# Patient Record
Sex: Female | Born: 1940 | Race: Black or African American | Hispanic: No | Smoking: Never smoker
Health system: Southern US, Community
[De-identification: ages and names within clinical notes are randomized; demographics above are authoritative.]

## PROBLEM LIST (undated history)

## (undated) DIAGNOSIS — R002 Palpitations: Secondary | ICD-10-CM

## (undated) DIAGNOSIS — I4891 Unspecified atrial fibrillation: Secondary | ICD-10-CM

## (undated) DIAGNOSIS — E119 Type 2 diabetes mellitus without complications: Secondary | ICD-10-CM

## (undated) DIAGNOSIS — R600 Localized edema: Secondary | ICD-10-CM

## (undated) DIAGNOSIS — K219 Gastro-esophageal reflux disease without esophagitis: Secondary | ICD-10-CM

## (undated) DIAGNOSIS — L710 Perioral dermatitis: Secondary | ICD-10-CM

## (undated) DIAGNOSIS — I10 Essential (primary) hypertension: Secondary | ICD-10-CM

## (undated) DIAGNOSIS — N390 Urinary tract infection, site not specified: Secondary | ICD-10-CM

## (undated) DIAGNOSIS — I499 Cardiac arrhythmia, unspecified: Secondary | ICD-10-CM

## (undated) DIAGNOSIS — M1990 Unspecified osteoarthritis, unspecified site: Secondary | ICD-10-CM

## (undated) DIAGNOSIS — M5416 Radiculopathy, lumbar region: Secondary | ICD-10-CM

## (undated) DIAGNOSIS — M549 Dorsalgia, unspecified: Secondary | ICD-10-CM

## (undated) DIAGNOSIS — J45909 Unspecified asthma, uncomplicated: Secondary | ICD-10-CM

## (undated) DIAGNOSIS — E7800 Pure hypercholesterolemia, unspecified: Secondary | ICD-10-CM

## (undated) DIAGNOSIS — M67479 Ganglion, unspecified ankle and foot: Secondary | ICD-10-CM

## (undated) HISTORY — DX: Perioral dermatitis: L71.0

## (undated) HISTORY — DX: Radiculopathy, lumbar region: M54.16

## (undated) HISTORY — DX: Urinary tract infection, site not specified: N39.0

## (undated) HISTORY — PX: WISDOM TOOTH EXTRACTION: SHX21

## (undated) HISTORY — PX: COLONOSCOPY: SHX174

## (undated) HISTORY — DX: Dorsalgia, unspecified: M54.9

## (undated) HISTORY — DX: Type 2 diabetes mellitus without complications: E11.9

## (undated) HISTORY — DX: Pure hypercholesterolemia, unspecified: E78.00

## (undated) HISTORY — DX: Unspecified osteoarthritis, unspecified site: M19.90

## (undated) HISTORY — DX: Palpitations: R00.2

## (undated) HISTORY — DX: Unspecified atrial fibrillation: I48.91

## (undated) HISTORY — PX: CATARACT EXTRACTION, BILATERAL: SHX1313

## (undated) HISTORY — DX: Ganglion, unspecified ankle and foot: M67.479

---

## 2005-07-13 ENCOUNTER — Other Ambulatory Visit: Payer: Self-pay

## 2005-07-13 ENCOUNTER — Emergency Department: Payer: Self-pay | Admitting: Emergency Medicine

## 2009-01-19 ENCOUNTER — Emergency Department (HOSPITAL_COMMUNITY): Admission: EM | Admit: 2009-01-19 | Discharge: 2009-01-19 | Payer: Self-pay | Admitting: Emergency Medicine

## 2009-02-14 ENCOUNTER — Encounter: Payer: Self-pay | Admitting: Family Medicine

## 2009-02-22 ENCOUNTER — Encounter: Payer: Self-pay | Admitting: Family Medicine

## 2009-03-25 ENCOUNTER — Encounter: Payer: Self-pay | Admitting: Family Medicine

## 2009-04-23 ENCOUNTER — Ambulatory Visit: Payer: Self-pay | Admitting: Family Medicine

## 2010-08-13 ENCOUNTER — Emergency Department: Payer: Self-pay | Admitting: Emergency Medicine

## 2012-10-26 DIAGNOSIS — E1165 Type 2 diabetes mellitus with hyperglycemia: Secondary | ICD-10-CM | POA: Diagnosis present

## 2013-05-08 ENCOUNTER — Emergency Department: Payer: Self-pay | Admitting: Emergency Medicine

## 2013-10-08 DIAGNOSIS — M6740 Ganglion, unspecified site: Secondary | ICD-10-CM | POA: Insufficient documentation

## 2013-10-30 DIAGNOSIS — M549 Dorsalgia, unspecified: Secondary | ICD-10-CM | POA: Insufficient documentation

## 2014-01-02 ENCOUNTER — Ambulatory Visit: Payer: Self-pay | Admitting: Internal Medicine

## 2014-01-05 ENCOUNTER — Ambulatory Visit: Payer: Self-pay | Admitting: Internal Medicine

## 2014-09-07 DIAGNOSIS — M179 Osteoarthritis of knee, unspecified: Secondary | ICD-10-CM | POA: Insufficient documentation

## 2014-09-07 DIAGNOSIS — M1710 Unilateral primary osteoarthritis, unspecified knee: Secondary | ICD-10-CM | POA: Insufficient documentation

## 2014-10-16 ENCOUNTER — Ambulatory Visit (INDEPENDENT_AMBULATORY_CARE_PROVIDER_SITE_OTHER): Payer: Medicare Other | Admitting: Family Medicine

## 2014-10-16 ENCOUNTER — Encounter: Payer: Self-pay | Admitting: Family Medicine

## 2014-10-16 VITALS — BP 142/71 | HR 86 | Ht 66.0 in | Wt 196.0 lb

## 2014-10-16 DIAGNOSIS — J4530 Mild persistent asthma, uncomplicated: Secondary | ICD-10-CM

## 2014-10-16 DIAGNOSIS — E538 Deficiency of other specified B group vitamins: Secondary | ICD-10-CM

## 2014-10-16 DIAGNOSIS — K219 Gastro-esophageal reflux disease without esophagitis: Secondary | ICD-10-CM | POA: Insufficient documentation

## 2014-10-16 DIAGNOSIS — E785 Hyperlipidemia, unspecified: Secondary | ICD-10-CM | POA: Insufficient documentation

## 2014-10-16 DIAGNOSIS — J3089 Other allergic rhinitis: Secondary | ICD-10-CM

## 2014-10-16 DIAGNOSIS — E782 Mixed hyperlipidemia: Secondary | ICD-10-CM | POA: Insufficient documentation

## 2014-10-16 DIAGNOSIS — I10 Essential (primary) hypertension: Secondary | ICD-10-CM | POA: Diagnosis not present

## 2014-10-16 DIAGNOSIS — E119 Type 2 diabetes mellitus without complications: Secondary | ICD-10-CM | POA: Insufficient documentation

## 2014-10-16 DIAGNOSIS — Z1239 Encounter for other screening for malignant neoplasm of breast: Secondary | ICD-10-CM

## 2014-10-16 DIAGNOSIS — R002 Palpitations: Secondary | ICD-10-CM | POA: Insufficient documentation

## 2014-10-16 DIAGNOSIS — Z01419 Encounter for gynecological examination (general) (routine) without abnormal findings: Secondary | ICD-10-CM

## 2014-10-16 DIAGNOSIS — J309 Allergic rhinitis, unspecified: Secondary | ICD-10-CM | POA: Insufficient documentation

## 2014-10-16 NOTE — Progress Notes (Signed)
  Subjective:     Mallory Arellano is a 74 y.o. female and is here for a GYN exam. The patient reports problems - poorly controlled DM. She is taking care of a sick husband and is not taking care of self.  Has primary MD. Had mammogram in 1/16--due for 6 month f/u. Does not want to see the same provider. No h/o abnormal pap smear. Reports vaginal itching on occasion.  History   Social History  . Marital Status: Married    Spouse Name: N/A  . Number of Children: N/A  . Years of Education: N/A   Occupational History  . Not on file.   Social History Main Topics  . Smoking status: Never Smoker   . Smokeless tobacco: Never Used  . Alcohol Use: No  . Drug Use: No  . Sexual Activity:    Partners: Male    Birth Control/ Protection: Post-menopausal   Other Topics Concern  . Not on file   Social History Narrative  . No narrative on file   There are no preventive care reminders to display for this patient.  The following portions of the patient's history were reviewed and updated as appropriate: allergies, current medications, past family history, past medical history, past social history, past surgical history and problem list.  Review of Systems A comprehensive review of systems was negative.   Objective:    BP 142/71 mmHg  Pulse 86  Ht 5\' 6"  (1.676 m)  Wt 196 lb (88.905 kg)  BMI 31.65 kg/m2 General appearance: alert, cooperative and appears stated age Head: Normocephalic, without obvious abnormality, atraumatic Neck: no adenopathy, supple, symmetrical, trachea midline and thyroid not enlarged, symmetric, no tenderness/mass/nodules Lungs: clear to auscultation bilaterally Breasts: normal appearance, no masses or tenderness Heart: regular rate and rhythm, S1, S2 normal, no murmur, click, rub or gallop Abdomen: soft, non-tender; bowel sounds normal; no masses,  no organomegaly Pelvic: cervix normal in appearance, external genitalia normal, no adnexal masses or tenderness, no  cervical motion tenderness, uterus normal size, shape, and consistency and atrophic vagina Extremities: extremities normal, atraumatic, no cyanosis or edema Pulses: 2+ and symmetric Skin: Skin color, texture, turgor normal. No rashes or lesions Lymph nodes: Cervical, supraclavicular, and axillary nodes normal. Neurologic: Grossly normal    Assessment:   GYN exam  Plan:  No longer requires pap smears. F/u mammogram as instructed-will schedule Per PCP--other f/u including needs colonoscopy   See After Visit Summary for Counseling Recommendations

## 2014-10-16 NOTE — Patient Instructions (Signed)
Preventive Care for Adults A healthy lifestyle and preventive care can promote health and wellness. Preventive health guidelines for women include the following key practices.  A routine yearly physical is a good way to check with your health care provider about your health and preventive screening. It is a chance to share any concerns and updates on your health and to receive a thorough exam.  Visit your dentist for a routine exam and preventive care every 6 months. Brush your teeth twice a day and floss once a day. Good oral hygiene prevents tooth decay and gum disease.  The frequency of eye exams is based on your age, health, family medical history, use of contact lenses, and other factors. Follow your health care provider's recommendations for frequency of eye exams.  Eat a healthy diet. Foods like vegetables, fruits, whole grains, low-fat dairy products, and lean protein foods contain the nutrients you need without too many calories. Decrease your intake of foods high in solid fats, added sugars, and salt. Eat the right amount of calories for you.Get information about a proper diet from your health care provider, if necessary.  Regular physical exercise is one of the most important things you can do for your health. Most adults should get at least 150 minutes of moderate-intensity exercise (any activity that increases your heart rate and causes you to sweat) each week. In addition, most adults need muscle-strengthening exercises on 2 or more days a week.  Maintain a healthy weight. The body mass index (BMI) is a screening tool to identify possible weight problems. It provides an estimate of body fat based on height and weight. Your health care provider can find your BMI and can help you achieve or maintain a healthy weight.For adults 20 years and older:  A BMI below 18.5 is considered underweight.  A BMI of 18.5 to 24.9 is normal.  A BMI of 25 to 29.9 is considered overweight.  A BMI of  30 and above is considered obese.  Maintain normal blood lipids and cholesterol levels by exercising and minimizing your intake of saturated fat. Eat a balanced diet with plenty of fruit and vegetables. Blood tests for lipids and cholesterol should begin at age 76 and be repeated every 5 years. If your lipid or cholesterol levels are high, you are over 50, or you are at high risk for heart disease, you may need your cholesterol levels checked more frequently.Ongoing high lipid and cholesterol levels should be treated with medicines if diet and exercise are not working.  If you smoke, find out from your health care provider how to quit. If you do not use tobacco, do not start.  Lung cancer screening is recommended for adults aged 22-80 years who are at high risk for developing lung cancer because of a history of smoking. A yearly low-dose CT scan of the lungs is recommended for people who have at least a 30-pack-year history of smoking and are a current smoker or have quit within the past 15 years. A pack year of smoking is smoking an average of 1 pack of cigarettes a day for 1 year (for example: 1 pack a day for 30 years or 2 packs a day for 15 years). Yearly screening should continue until the smoker has stopped smoking for at least 15 years. Yearly screening should be stopped for people who develop a health problem that would prevent them from having lung cancer treatment.  If you are pregnant, do not drink alcohol. If you are breastfeeding,  be very cautious about drinking alcohol. If you are not pregnant and choose to drink alcohol, do not have more than 1 drink per day. One drink is considered to be 12 ounces (355 mL) of beer, 5 ounces (148 mL) of wine, or 1.5 ounces (44 mL) of liquor.  Avoid use of street drugs. Do not share needles with anyone. Ask for help if you need support or instructions about stopping the use of drugs.  High blood pressure causes heart disease and increases the risk of  stroke. Your blood pressure should be checked at least every 1 to 2 years. Ongoing high blood pressure should be treated with medicines if weight loss and exercise do not work.  If you are 75-52 years old, ask your health care provider if you should take aspirin to prevent strokes.  Diabetes screening involves taking a blood sample to check your fasting blood sugar level. This should be done once every 3 years, after age 15, if you are within normal weight and without risk factors for diabetes. Testing should be considered at a younger age or be carried out more frequently if you are overweight and have at least 1 risk factor for diabetes.  Breast cancer screening is essential preventive care for women. You should practice "breast self-awareness." This means understanding the normal appearance and feel of your breasts and may include breast self-examination. Any changes detected, no matter how small, should be reported to a health care provider. Women in their 58s and 30s should have a clinical breast exam (CBE) by a health care provider as part of a regular health exam every 1 to 3 years. After age 16, women should have a CBE every year. Starting at age 53, women should consider having a mammogram (breast X-ray test) every year. Women who have a family history of breast cancer should talk to their health care provider about genetic screening. Women at a high risk of breast cancer should talk to their health care providers about having an MRI and a mammogram every year.  Breast cancer gene (BRCA)-related cancer risk assessment is recommended for women who have family members with BRCA-related cancers. BRCA-related cancers include breast, ovarian, tubal, and peritoneal cancers. Having family members with these cancers may be associated with an increased risk for harmful changes (mutations) in the breast cancer genes BRCA1 and BRCA2. Results of the assessment will determine the need for genetic counseling and  BRCA1 and BRCA2 testing.  Routine pelvic exams to screen for cancer are no longer recommended for nonpregnant women who are considered low risk for cancer of the pelvic organs (ovaries, uterus, and vagina) and who do not have symptoms. Ask your health care provider if a screening pelvic exam is right for you.  If you have had past treatment for cervical cancer or a condition that could lead to cancer, you need Pap tests and screening for cancer for at least 20 years after your treatment. If Pap tests have been discontinued, your risk factors (such as having a new sexual partner) need to be reassessed to determine if screening should be resumed. Some women have medical problems that increase the chance of getting cervical cancer. In these cases, your health care provider may recommend more frequent screening and Pap tests.  The HPV test is an additional test that may be used for cervical cancer screening. The HPV test looks for the virus that can cause the cell changes on the cervix. The cells collected during the Pap test can be  tested for HPV. The HPV test could be used to screen women aged 30 years and older, and should be used in women of any age who have unclear Pap test results. After the age of 30, women should have HPV testing at the same frequency as a Pap test.  Colorectal cancer can be detected and often prevented. Most routine colorectal cancer screening begins at the age of 50 years and continues through age 75 years. However, your health care provider may recommend screening at an earlier age if you have risk factors for colon cancer. On a yearly basis, your health care provider may provide home test kits to check for hidden blood in the stool. Use of a small camera at the end of a tube, to directly examine the colon (sigmoidoscopy or colonoscopy), can detect the earliest forms of colorectal cancer. Talk to your health care provider about this at age 50, when routine screening begins. Direct  exam of the colon should be repeated every 5-10 years through age 75 years, unless early forms of pre-cancerous polyps or small growths are found.  People who are at an increased risk for hepatitis B should be screened for this virus. You are considered at high risk for hepatitis B if:  You were born in a country where hepatitis B occurs often. Talk with your health care provider about which countries are considered high risk.  Your parents were born in a high-risk country and you have not received a shot to protect against hepatitis B (hepatitis B vaccine).  You have HIV or AIDS.  You use needles to inject street drugs.  You live with, or have sex with, someone who has hepatitis B.  You get hemodialysis treatment.  You take certain medicines for conditions like cancer, organ transplantation, and autoimmune conditions.  Hepatitis C blood testing is recommended for all people born from 1945 through 1965 and any individual with known risks for hepatitis C.  Practice safe sex. Use condoms and avoid high-risk sexual practices to reduce the spread of sexually transmitted infections (STIs). STIs include gonorrhea, chlamydia, syphilis, trichomonas, herpes, HPV, and human immunodeficiency virus (HIV). Herpes, HIV, and HPV are viral illnesses that have no cure. They can result in disability, cancer, and death.  You should be screened for sexually transmitted illnesses (STIs) including gonorrhea and chlamydia if:  You are sexually active and are younger than 24 years.  You are older than 24 years and your health care provider tells you that you are at risk for this type of infection.  Your sexual activity has changed since you were last screened and you are at an increased risk for chlamydia or gonorrhea. Ask your health care provider if you are at risk.  If you are at risk of being infected with HIV, it is recommended that you take a prescription medicine daily to prevent HIV infection. This is  called preexposure prophylaxis (PrEP). You are considered at risk if:  You are a heterosexual woman, are sexually active, and are at increased risk for HIV infection.  You take drugs by injection.  You are sexually active with a partner who has HIV.  Talk with your health care provider about whether you are at high risk of being infected with HIV. If you choose to begin PrEP, you should first be tested for HIV. You should then be tested every 3 months for as long as you are taking PrEP.  Osteoporosis is a disease in which the bones lose minerals and strength   with aging. This can result in serious bone fractures or breaks. The risk of osteoporosis can be identified using a bone density scan. Women ages 65 years and over and women at risk for fractures or osteoporosis should discuss screening with their health care providers. Ask your health care provider whether you should take a calcium supplement or vitamin D to reduce the rate of osteoporosis.  Menopause can be associated with physical symptoms and risks. Hormone replacement therapy is available to decrease symptoms and risks. You should talk to your health care provider about whether hormone replacement therapy is right for you.  Use sunscreen. Apply sunscreen liberally and repeatedly throughout the day. You should seek shade when your shadow is shorter than you. Protect yourself by wearing long sleeves, pants, a wide-brimmed hat, and sunglasses year round, whenever you are outdoors.  Once a month, do a whole body skin exam, using a mirror to look at the skin on your back. Tell your health care provider of new moles, moles that have irregular borders, moles that are larger than a pencil eraser, or moles that have changed in shape or color.  Stay current with required vaccines (immunizations).  Influenza vaccine. All adults should be immunized every year.  Tetanus, diphtheria, and acellular pertussis (Td, Tdap) vaccine. Pregnant women should  receive 1 dose of Tdap vaccine during each pregnancy. The dose should be obtained regardless of the length of time since the last dose. Immunization is preferred during the 27th-36th week of gestation. An adult who has not previously received Tdap or who does not know her vaccine status should receive 1 dose of Tdap. This initial dose should be followed by tetanus and diphtheria toxoids (Td) booster doses every 10 years. Adults with an unknown or incomplete history of completing a 3-dose immunization series with Td-containing vaccines should begin or complete a primary immunization series including a Tdap dose. Adults should receive a Td booster every 10 years.  Varicella vaccine. An adult without evidence of immunity to varicella should receive 2 doses or a second dose if she has previously received 1 dose. Pregnant females who do not have evidence of immunity should receive the first dose after pregnancy. This first dose should be obtained before leaving the health care facility. The second dose should be obtained 4-8 weeks after the first dose.  Human papillomavirus (HPV) vaccine. Females aged 13-26 years who have not received the vaccine previously should obtain the 3-dose series. The vaccine is not recommended for use in pregnant females. However, pregnancy testing is not needed before receiving a dose. If a female is found to be pregnant after receiving a dose, no treatment is needed. In that case, the remaining doses should be delayed until after the pregnancy. Immunization is recommended for any person with an immunocompromised condition through the age of 26 years if she did not get any or all doses earlier. During the 3-dose series, the second dose should be obtained 4-8 weeks after the first dose. The third dose should be obtained 24 weeks after the first dose and 16 weeks after the second dose.  Zoster vaccine. One dose is recommended for adults aged 60 years or older unless certain conditions are  present.  Measles, mumps, and rubella (MMR) vaccine. Adults born before 1957 generally are considered immune to measles and mumps. Adults born in 1957 or later should have 1 or more doses of MMR vaccine unless there is a contraindication to the vaccine or there is laboratory evidence of immunity to   each of the three diseases. A routine second dose of MMR vaccine should be obtained at least 28 days after the first dose for students attending postsecondary schools, health care workers, or international travelers. People who received inactivated measles vaccine or an unknown type of measles vaccine during 1963-1967 should receive 2 doses of MMR vaccine. People who received inactivated mumps vaccine or an unknown type of mumps vaccine before 1979 and are at high risk for mumps infection should consider immunization with 2 doses of MMR vaccine. For females of childbearing age, rubella immunity should be determined. If there is no evidence of immunity, females who are not pregnant should be vaccinated. If there is no evidence of immunity, females who are pregnant should delay immunization until after pregnancy. Unvaccinated health care workers born before 1957 who lack laboratory evidence of measles, mumps, or rubella immunity or laboratory confirmation of disease should consider measles and mumps immunization with 2 doses of MMR vaccine or rubella immunization with 1 dose of MMR vaccine.  Pneumococcal 13-valent conjugate (PCV13) vaccine. When indicated, a person who is uncertain of her immunization history and has no record of immunization should receive the PCV13 vaccine. An adult aged 19 years or older who has certain medical conditions and has not been previously immunized should receive 1 dose of PCV13 vaccine. This PCV13 should be followed with a dose of pneumococcal polysaccharide (PPSV23) vaccine. The PPSV23 vaccine dose should be obtained at least 8 weeks after the dose of PCV13 vaccine. An adult aged 19  years or older who has certain medical conditions and previously received 1 or more doses of PPSV23 vaccine should receive 1 dose of PCV13. The PCV13 vaccine dose should be obtained 1 or more years after the last PPSV23 vaccine dose.  Pneumococcal polysaccharide (PPSV23) vaccine. When PCV13 is also indicated, PCV13 should be obtained first. All adults aged 65 years and older should be immunized. An adult younger than age 65 years who has certain medical conditions should be immunized. Any person who resides in a nursing home or long-term care facility should be immunized. An adult smoker should be immunized. People with an immunocompromised condition and certain other conditions should receive both PCV13 and PPSV23 vaccines. People with human immunodeficiency virus (HIV) infection should be immunized as soon as possible after diagnosis. Immunization during chemotherapy or radiation therapy should be avoided. Routine use of PPSV23 vaccine is not recommended for American Indians, [place] Natives, or people younger than 65 years unless there are medical conditions that require PPSV23 vaccine. When indicated, people who have unknown immunization and have no record of immunization should receive PPSV23 vaccine. One-time revaccination 5 years after the first dose of PPSV23 is recommended for people aged 19-64 years who have chronic kidney failure, nephrotic syndrome, asplenia, or immunocompromised conditions. People who received 1-2 doses of PPSV23 before age 65 years should receive another dose of PPSV23 vaccine at age 65 years or later if at least 5 years have passed since the previous dose. Doses of PPSV23 are not needed for people immunized with PPSV23 at or after age 65 years.  Meningococcal vaccine. Adults with asplenia or persistent complement component deficiencies should receive 2 doses of quadrivalent meningococcal conjugate (MenACWY-D) vaccine. The doses should be obtained at least 2 months apart.  Microbiologists working with certain meningococcal bacteria, military recruits, people at risk during an outbreak, and people who travel to or live in countries with a high rate of meningitis should be immunized. A first-year college student up through age   21 years who is living in a residence hall should receive a dose if she did not receive a dose on or after her 16th birthday. Adults who have certain high-risk conditions should receive one or more doses of vaccine.  Hepatitis A vaccine. Adults who wish to be protected from this disease, have certain high-risk conditions, work with hepatitis A-infected animals, work in hepatitis A research labs, or travel to or work in countries with a high rate of hepatitis A should be immunized. Adults who were previously unvaccinated and who anticipate close contact with an international adoptee during the first 60 days after arrival in the [place] from a country with a high rate of hepatitis A should be immunized.  Hepatitis B vaccine. Adults who wish to be protected from this disease, have certain high-risk conditions, may be exposed to blood or other infectious body fluids, are household contacts or sex partners of hepatitis B positive people, are clients or workers in certain care facilities, or travel to or work in countries with a high rate of hepatitis B should be immunized.  Haemophilus influenzae type b (Hib) vaccine. A previously unvaccinated person with asplenia or sickle cell disease or having a scheduled splenectomy should receive 1 dose of Hib vaccine. Regardless of previous immunization, a recipient of a hematopoietic stem cell transplant should receive a 3-dose series 6-12 months after her successful transplant. Hib vaccine is not recommended for adults with HIV infection. Preventive Services / Frequency Ages 64 to 68 years  Blood pressure check.** / Every 1 to 2 years.  Lipid and cholesterol check.** / Every 5 years beginning at age  22.  Clinical breast exam.** / Every 3 years for women in their 88s and 53s.  BRCA-related cancer risk assessment.** / For women who have family members with a BRCA-related cancer (breast, ovarian, tubal, or peritoneal cancers).  Pap test.** / Every 2 years from ages 90 through 51. Every 3 years starting at age 21 through age 56 or 3 with a history of 3 consecutive normal Pap tests.  HPV screening.** / Every 3 years from ages 24 through ages 1 to 46 with a history of 3 consecutive normal Pap tests.  Hepatitis C blood test.** / For any individual with known risks for hepatitis C.  Skin self-exam. / Monthly.  Influenza vaccine. / Every year.  Tetanus, diphtheria, and acellular pertussis (Tdap, Td) vaccine.** / Consult your health care provider. Pregnant women should receive 1 dose of Tdap vaccine during each pregnancy. 1 dose of Td every 10 years.  Varicella vaccine.** / Consult your health care provider. Pregnant females who do not have evidence of immunity should receive the first dose after pregnancy.  HPV vaccine. / 3 doses over 6 months, if 72 and younger. The vaccine is not recommended for use in pregnant females. However, pregnancy testing is not needed before receiving a dose.  Measles, mumps, rubella (MMR) vaccine.** / You need at least 1 dose of MMR if you were born in 1957 or later. You may also need a 2nd dose. For females of childbearing age, rubella immunity should be determined. If there is no evidence of immunity, females who are not pregnant should be vaccinated. If there is no evidence of immunity, females who are pregnant should delay immunization until after pregnancy.  Pneumococcal 13-valent conjugate (PCV13) vaccine.** / Consult your health care provider.  Pneumococcal polysaccharide (PPSV23) vaccine.** / 1 to 2 doses if you smoke cigarettes or if you have certain conditions.  Meningococcal vaccine.** /  1 dose if you are age 19 to 21 years and a first-year college  student living in a residence hall, or have one of several medical conditions, you need to get vaccinated against meningococcal disease. You may also need additional booster doses.  Hepatitis A vaccine.** / Consult your health care provider.  Hepatitis B vaccine.** / Consult your health care provider.  Haemophilus influenzae type b (Hib) vaccine.** / Consult your health care provider. Ages 40 to 64 years  Blood pressure check.** / Every 1 to 2 years.  Lipid and cholesterol check.** / Every 5 years beginning at age 20 years.  Lung cancer screening. / Every year if you are aged 55-80 years and have a 30-pack-year history of smoking and currently smoke or have quit within the past 15 years. Yearly screening is stopped once you have quit smoking for at least 15 years or develop a health problem that would prevent you from having lung cancer treatment.  Clinical breast exam.** / Every year after age 40 years.  BRCA-related cancer risk assessment.** / For women who have family members with a BRCA-related cancer (breast, ovarian, tubal, or peritoneal cancers).  Mammogram.** / Every year beginning at age 40 years and continuing for as long as you are in good health. Consult with your health care provider.  Pap test.** / Every 3 years starting at age 30 years through age 65 or 70 years with a history of 3 consecutive normal Pap tests.  HPV screening.** / Every 3 years from ages 30 years through ages 65 to 70 years with a history of 3 consecutive normal Pap tests.  Fecal occult blood test (FOBT) of stool. / Every year beginning at age 50 years and continuing until age 75 years. You may not need to do this test if you get a colonoscopy every 10 years.  Flexible sigmoidoscopy or colonoscopy.** / Every 5 years for a flexible sigmoidoscopy or every 10 years for a colonoscopy beginning at age 50 years and continuing until age 75 years.  Hepatitis C blood test.** / For all people born from 1945 through  1965 and any individual with known risks for hepatitis C.  Skin self-exam. / Monthly.  Influenza vaccine. / Every year.  Tetanus, diphtheria, and acellular pertussis (Tdap/Td) vaccine.** / Consult your health care provider. Pregnant women should receive 1 dose of Tdap vaccine during each pregnancy. 1 dose of Td every 10 years.  Varicella vaccine.** / Consult your health care provider. Pregnant females who do not have evidence of immunity should receive the first dose after pregnancy.  Zoster vaccine.** / 1 dose for adults aged 60 years or older.  Measles, mumps, rubella (MMR) vaccine.** / You need at least 1 dose of MMR if you were born in 1957 or later. You may also need a 2nd dose. For females of childbearing age, rubella immunity should be determined. If there is no evidence of immunity, females who are not pregnant should be vaccinated. If there is no evidence of immunity, females who are pregnant should delay immunization until after pregnancy.  Pneumococcal 13-valent conjugate (PCV13) vaccine.** / Consult your health care provider.  Pneumococcal polysaccharide (PPSV23) vaccine.** / 1 to 2 doses if you smoke cigarettes or if you have certain conditions.  Meningococcal vaccine.** / Consult your health care provider.  Hepatitis A vaccine.** / Consult your health care provider.  Hepatitis B vaccine.** / Consult your health care provider.  Haemophilus influenzae type b (Hib) vaccine.** / Consult your health care provider. Ages 65   years and over  Blood pressure check.** / Every 1 to 2 years.  Lipid and cholesterol check.** / Every 5 years beginning at age 22 years.  Lung cancer screening. / Every year if you are aged 73-80 years and have a 30-pack-year history of smoking and currently smoke or have quit within the past 15 years. Yearly screening is stopped once you have quit smoking for at least 15 years or develop a health problem that would prevent you from having lung cancer  treatment.  Clinical breast exam.** / Every year after age 4 years.  BRCA-related cancer risk assessment.** / For women who have family members with a BRCA-related cancer (breast, ovarian, tubal, or peritoneal cancers).  Mammogram.** / Every year beginning at age 40 years and continuing for as long as you are in good health. Consult with your health care provider.  Pap test.** / Every 3 years starting at age 9 years through age 34 or 91 years with 3 consecutive normal Pap tests. Testing can be stopped between 65 and 70 years with 3 consecutive normal Pap tests and no abnormal Pap or HPV tests in the past 10 years.  HPV screening.** / Every 3 years from ages 57 years through ages 64 or 45 years with a history of 3 consecutive normal Pap tests. Testing can be stopped between 65 and 70 years with 3 consecutive normal Pap tests and no abnormal Pap or HPV tests in the past 10 years.  Fecal occult blood test (FOBT) of stool. / Every year beginning at age 15 years and continuing until age 17 years. You may not need to do this test if you get a colonoscopy every 10 years.  Flexible sigmoidoscopy or colonoscopy.** / Every 5 years for a flexible sigmoidoscopy or every 10 years for a colonoscopy beginning at age 86 years and continuing until age 71 years.  Hepatitis C blood test.** / For all people born from [birth year] through 1965 and any individual with known risks for hepatitis C.  Osteoporosis screening.** / A one-time screening for women ages 83 years and over and women at risk for fractures or osteoporosis.  Skin self-exam. / Monthly.  Influenza vaccine. / Every year.  Tetanus, diphtheria, and acellular pertussis (Tdap/Td) vaccine.** / 1 dose of Td every 10 years.  Varicella vaccine.** / Consult your health care provider.  Zoster vaccine.** / 1 dose for adults aged 61 years or older.  Pneumococcal 13-valent conjugate (PCV13) vaccine.** / Consult your health care provider.  Pneumococcal  polysaccharide (PPSV23) vaccine.** / 1 dose for all adults aged 28 years and older.  Meningococcal vaccine.** / Consult your health care provider.  Hepatitis A vaccine.** / Consult your health care provider.  Hepatitis B vaccine.** / Consult your health care provider.  Haemophilus influenzae type b (Hib) vaccine.** / Consult your health care provider. ** Family history and personal history of risk and conditions may change your health care provider's recommendations. Document Released: 07/07/2001 Document Revised: 09/25/2013 Document Reviewed: 10/06/2010 [facility] Patient Information 2015 [place], [place]. This information is not intended to replace advice given to you by your health care provider. Make sure you discuss any questions you have with your health care provider.

## 2014-10-31 ENCOUNTER — Ambulatory Visit: Payer: Self-pay | Admitting: Obstetrics & Gynecology

## 2014-12-29 ENCOUNTER — Inpatient Hospital Stay (HOSPITAL_COMMUNITY)
Admission: EM | Admit: 2014-12-29 | Discharge: 2014-12-31 | DRG: 641 | Disposition: A | Discharge status: Home / Self Care | Payer: Medicare Other | Attending: Oncology | Admitting: Oncology

## 2014-12-29 ENCOUNTER — Encounter (HOSPITAL_COMMUNITY): Payer: Self-pay | Admitting: Emergency Medicine

## 2014-12-29 ENCOUNTER — Inpatient Hospital Stay (HOSPITAL_COMMUNITY): Payer: Medicare Other

## 2014-12-29 ENCOUNTER — Emergency Department (HOSPITAL_COMMUNITY): Payer: Medicare Other

## 2014-12-29 DIAGNOSIS — I480 Paroxysmal atrial fibrillation: Secondary | ICD-10-CM | POA: Diagnosis present

## 2014-12-29 DIAGNOSIS — Z79899 Other long term (current) drug therapy: Secondary | ICD-10-CM | POA: Diagnosis not present

## 2014-12-29 DIAGNOSIS — E1149 Type 2 diabetes mellitus with other diabetic neurological complication: Secondary | ICD-10-CM

## 2014-12-29 DIAGNOSIS — E785 Hyperlipidemia, unspecified: Secondary | ICD-10-CM | POA: Diagnosis present

## 2014-12-29 DIAGNOSIS — I10 Essential (primary) hypertension: Secondary | ICD-10-CM | POA: Diagnosis present

## 2014-12-29 DIAGNOSIS — E1165 Type 2 diabetes mellitus with hyperglycemia: Secondary | ICD-10-CM | POA: Diagnosis present

## 2014-12-29 DIAGNOSIS — Z7982 Long term (current) use of aspirin: Secondary | ICD-10-CM | POA: Diagnosis not present

## 2014-12-29 DIAGNOSIS — R112 Nausea with vomiting, unspecified: Secondary | ICD-10-CM | POA: Diagnosis present

## 2014-12-29 DIAGNOSIS — Z880 Allergy status to penicillin: Secondary | ICD-10-CM

## 2014-12-29 DIAGNOSIS — R42 Dizziness and giddiness: Secondary | ICD-10-CM | POA: Diagnosis present

## 2014-12-29 DIAGNOSIS — E869 Volume depletion, unspecified: Secondary | ICD-10-CM | POA: Diagnosis present

## 2014-12-29 DIAGNOSIS — E780 Pure hypercholesterolemia: Secondary | ICD-10-CM | POA: Diagnosis present

## 2014-12-29 DIAGNOSIS — J45909 Unspecified asthma, uncomplicated: Secondary | ICD-10-CM | POA: Diagnosis present

## 2014-12-29 DIAGNOSIS — Z7952 Long term (current) use of systemic steroids: Secondary | ICD-10-CM

## 2014-12-29 DIAGNOSIS — Z7951 Long term (current) use of inhaled steroids: Secondary | ICD-10-CM | POA: Diagnosis not present

## 2014-12-29 DIAGNOSIS — R197 Diarrhea, unspecified: Secondary | ICD-10-CM | POA: Diagnosis present

## 2014-12-29 DIAGNOSIS — R110 Nausea: Secondary | ICD-10-CM

## 2014-12-29 DIAGNOSIS — R739 Hyperglycemia, unspecified: Secondary | ICD-10-CM

## 2014-12-29 DIAGNOSIS — E782 Mixed hyperlipidemia: Secondary | ICD-10-CM | POA: Diagnosis present

## 2014-12-29 DIAGNOSIS — IMO0002 Reserved for concepts with insufficient information to code with codable children: Secondary | ICD-10-CM | POA: Diagnosis present

## 2014-12-29 HISTORY — DX: Type 2 diabetes mellitus without complications: E11.9

## 2014-12-29 HISTORY — DX: Essential (primary) hypertension: I10

## 2014-12-29 HISTORY — DX: Unspecified asthma, uncomplicated: J45.909

## 2014-12-29 LAB — CBC WITH DIFFERENTIAL/PLATELET
BASOS ABS: 0 10*3/uL (ref 0.0–0.1)
Basophils Relative: 0 % (ref 0–1)
EOS PCT: 0 % (ref 0–5)
Eosinophils Absolute: 0 10*3/uL (ref 0.0–0.7)
HCT: 41 % (ref 36.0–46.0)
HEMOGLOBIN: 13.6 g/dL (ref 12.0–15.0)
LYMPHS ABS: 0.8 10*3/uL (ref 0.7–4.0)
Lymphocytes Relative: 10 % — ABNORMAL LOW (ref 12–46)
MCH: 28.8 pg (ref 26.0–34.0)
MCHC: 33.2 g/dL (ref 30.0–36.0)
MCV: 86.9 fL (ref 78.0–100.0)
MONO ABS: 0.3 10*3/uL (ref 0.1–1.0)
Monocytes Relative: 4 % (ref 3–12)
Neutro Abs: 7.3 10*3/uL (ref 1.7–7.7)
Neutrophils Relative %: 86 % — ABNORMAL HIGH (ref 43–77)
PLATELETS: 273 10*3/uL (ref 150–400)
RBC: 4.72 MIL/uL (ref 3.87–5.11)
RDW: 13.6 % (ref 11.5–15.5)
WBC: 8.4 10*3/uL (ref 4.0–10.5)

## 2014-12-29 LAB — URINE MICROSCOPIC-ADD ON

## 2014-12-29 LAB — URINALYSIS, ROUTINE W REFLEX MICROSCOPIC
BILIRUBIN URINE: NEGATIVE
Glucose, UA: >1000 mg/dL — AB
Hgb urine dipstick: NEGATIVE
Ketones, ur: NEGATIVE mg/dL
Leukocytes, UA: NEGATIVE
Nitrite: NEGATIVE
Protein, ur: NEGATIVE mg/dL
Specific Gravity, Urine: 1.021 (ref 1.005–1.030)
Urobilinogen, UA: 0.2 mg/dL (ref 0.0–1.0)
pH: 7.5 (ref 5.0–8.0)

## 2014-12-29 LAB — COMPREHENSIVE METABOLIC PANEL
ALT: 19 U/L (ref 14–54)
AST: 18 U/L (ref 15–41)
Albumin: 3.9 g/dL (ref 3.5–5.0)
Alkaline Phosphatase: 53 U/L (ref 38–126)
Anion gap: 10 (ref 5–15)
BUN: 14 mg/dL (ref 6–20)
CALCIUM: 9.1 mg/dL (ref 8.9–10.3)
CHLORIDE: 100 mmol/L — AB (ref 101–111)
CO2: 29 mmol/L (ref 22–32)
CREATININE: 0.99 mg/dL (ref 0.44–1.00)
GFR calc non Af Amer: 55 mL/min — ABNORMAL LOW (ref >60)
Glucose, Bld: 287 mg/dL — ABNORMAL HIGH (ref 65–99)
Potassium: 4 mmol/L (ref 3.5–5.1)
SODIUM: 139 mmol/L (ref 135–145)
TOTAL PROTEIN: 7 g/dL (ref 6.5–8.1)
Total Bilirubin: 0.4 mg/dL (ref 0.3–1.2)

## 2014-12-29 LAB — CBG MONITORING, ED
GLUCOSE-CAPILLARY: 193 mg/dL — AB (ref 65–99)
Glucose-Capillary: 238 mg/dL — ABNORMAL HIGH (ref 65–99)

## 2014-12-29 LAB — GLUCOSE, CAPILLARY
Glucose-Capillary: 155 mg/dL — ABNORMAL HIGH (ref 65–99)
Glucose-Capillary: 183 mg/dL — ABNORMAL HIGH (ref 65–99)

## 2014-12-29 MED ORDER — ACETAMINOPHEN 325 MG PO TABS
650.0000 mg | ORAL_TABLET | Freq: Four times a day (QID) | ORAL | Status: DC | PRN
  Filled 2014-12-29 (×2): qty 2

## 2014-12-29 MED ORDER — ASPIRIN 81 MG PO CHEW
81.0000 mg | CHEWABLE_TABLET | Freq: Every day | ORAL | Status: DC
  Filled 2014-12-29: qty 1

## 2014-12-29 MED ORDER — MECLIZINE HCL 25 MG PO TABS
25.0000 mg | ORAL_TABLET | Freq: Once | ORAL | Status: AC
  Administered 2014-12-29: 25 mg via ORAL
  Filled 2014-12-29: qty 1

## 2014-12-29 MED ORDER — ENOXAPARIN SODIUM 40 MG/0.4ML ~~LOC~~ SOLN
40.0000 mg | SUBCUTANEOUS | Status: DC
  Administered 2014-12-29: 40 mg via SUBCUTANEOUS
  Filled 2014-12-29: qty 0.4

## 2014-12-29 MED ORDER — SODIUM CHLORIDE 0.9 % IV SOLN: 250.0000 mL | INTRAVENOUS | Status: DC | PRN

## 2014-12-29 MED ORDER — SODIUM CHLORIDE 0.9 % IJ SOLN: 3.0000 mL | INTRAMUSCULAR | Status: DC | PRN

## 2014-12-29 MED ORDER — FLECAINIDE ACETATE 50 MG PO TABS
150.0000 mg | ORAL_TABLET | Freq: Two times a day (BID) | ORAL | Status: DC
  Administered 2014-12-29 – 2014-12-31 (×4): 150 mg via ORAL
  Filled 2014-12-29 (×7): qty 1

## 2014-12-29 MED ORDER — ONDANSETRON HCL 4 MG/2ML IJ SOLN
4.0000 mg | Freq: Once | INTRAMUSCULAR | Status: AC
  Administered 2014-12-29: 4 mg via INTRAVENOUS
  Filled 2014-12-29: qty 2

## 2014-12-29 MED ORDER — ONDANSETRON HCL 4 MG/2ML IJ SOLN
4.0000 mg | Freq: Four times a day (QID) | INTRAMUSCULAR | Status: DC | PRN
  Administered 2014-12-31: 4 mg via INTRAVENOUS
  Filled 2014-12-29: qty 2

## 2014-12-29 MED ORDER — OFLOXACIN 0.3 % OP SOLN
1.0000 [drp] | Freq: Four times a day (QID) | OPHTHALMIC | Status: DC
  Administered 2014-12-30 – 2014-12-31 (×4): 1 [drp] via OPHTHALMIC
  Filled 2014-12-29: qty 5

## 2014-12-29 MED ORDER — LOSARTAN POTASSIUM-HCTZ 100-12.5 MG PO TABS: 1.0000 | ORAL_TABLET | Freq: Every day | ORAL | Status: DC

## 2014-12-29 MED ORDER — METOCLOPRAMIDE HCL 5 MG/ML IJ SOLN
10.0000 mg | Freq: Once | INTRAMUSCULAR | Status: AC
  Administered 2014-12-29: 10 mg via INTRAVENOUS
  Filled 2014-12-29: qty 2

## 2014-12-29 MED ORDER — ACETAMINOPHEN 650 MG RE SUPP: 650.0000 mg | Freq: Four times a day (QID) | RECTAL | Status: DC | PRN

## 2014-12-29 MED ORDER — ALBUTEROL SULFATE (2.5 MG/3ML) 0.083% IN NEBU: 2.5000 mg | INHALATION_SOLUTION | Freq: Four times a day (QID) | RESPIRATORY_TRACT | Status: DC | PRN

## 2014-12-29 MED ORDER — LOSARTAN POTASSIUM 50 MG PO TABS
100.0000 mg | ORAL_TABLET | Freq: Every day | ORAL | Status: DC
  Administered 2014-12-30 – 2014-12-31 (×2): 100 mg via ORAL
  Filled 2014-12-29 (×2): qty 2

## 2014-12-29 MED ORDER — ATORVASTATIN CALCIUM 20 MG PO TABS
20.0000 mg | ORAL_TABLET | Freq: Every day | ORAL | Status: DC
  Administered 2014-12-30 – 2014-12-31 (×3): 20 mg via ORAL
  Filled 2014-12-29 (×3): qty 1

## 2014-12-29 MED ORDER — SODIUM CHLORIDE 0.9 % IV BOLUS (SEPSIS)
1000.0000 mL | Freq: Once | INTRAVENOUS | Status: AC
  Administered 2014-12-29: 1000 mL via INTRAVENOUS

## 2014-12-29 MED ORDER — SODIUM CHLORIDE 0.9 % IJ SOLN
3.0000 mL | Freq: Two times a day (BID) | INTRAMUSCULAR | Status: DC
  Administered 2014-12-31: 3 mL via INTRAVENOUS

## 2014-12-29 MED ORDER — HYDROCHLOROTHIAZIDE 12.5 MG PO CAPS
12.5000 mg | ORAL_CAPSULE | Freq: Every day | ORAL | Status: DC
  Administered 2014-12-30 – 2014-12-31 (×2): 12.5 mg via ORAL
  Filled 2014-12-29 (×2): qty 1

## 2014-12-29 MED ORDER — BUDESONIDE-FORMOTEROL FUMARATE 160-4.5 MCG/ACT IN AERO
2.0000 | INHALATION_SPRAY | Freq: Every day | RESPIRATORY_TRACT | Status: DC
  Administered 2014-12-29 – 2014-12-31 (×2): 2 via RESPIRATORY_TRACT
  Filled 2014-12-29: qty 6

## 2014-12-29 MED ORDER — ONDANSETRON HCL 4 MG PO TABS: 4.0000 mg | ORAL_TABLET | Freq: Four times a day (QID) | ORAL | Status: DC | PRN

## 2014-12-29 MED ORDER — ASPIRIN 325 MG PO TABS: 325.0000 mg | ORAL_TABLET | Freq: Once | ORAL | Status: DC

## 2014-12-29 MED ORDER — MONTELUKAST SODIUM 10 MG PO TABS
10.0000 mg | ORAL_TABLET | Freq: Every day | ORAL | Status: DC
  Administered 2014-12-29 – 2014-12-30 (×2): 10 mg via ORAL
  Filled 2014-12-29 (×2): qty 1

## 2014-12-29 MED ORDER — SODIUM CHLORIDE 0.9 % IV SOLN
INTRAVENOUS | Status: DC
  Administered 2014-12-29: 1000 mL via INTRAVENOUS

## 2014-12-29 MED ORDER — INSULIN ASPART 100 UNIT/ML ~~LOC~~ SOLN
0.0000 [IU] | Freq: Three times a day (TID) | SUBCUTANEOUS | Status: DC
  Administered 2014-12-30: 3 [IU] via SUBCUTANEOUS

## 2014-12-29 MED ORDER — INSULIN ASPART 100 UNIT/ML ~~LOC~~ SOLN: 0.0000 [IU] | Freq: Every day | SUBCUTANEOUS | Status: DC

## 2014-12-29 MED ORDER — PANTOPRAZOLE SODIUM 40 MG PO TBEC
40.0000 mg | DELAYED_RELEASE_TABLET | Freq: Two times a day (BID) | ORAL | Status: DC
  Administered 2014-12-29 – 2014-12-31 (×4): 40 mg via ORAL
  Filled 2014-12-29 (×4): qty 1

## 2014-12-29 MED ORDER — GLUCERNA SHAKE PO LIQD: 237.0000 mL | Freq: Three times a day (TID) | ORAL | Status: DC | PRN

## 2014-12-29 NOTE — ED Notes (Addendum)
Pt and family made aware of bed assignment 

## 2014-12-29 NOTE — ED Provider Notes (Signed)
2:14 PM Patient continues to be lightheaded and nauseated when she stands up.  I suspect much of this is ongoing volume depletion from poorly controlled diabetes over the past 3-4 weeks.  Much of this may also be somatic in nature secondary to grief reaction of her husband's death one month ago.  Normal strength in her arms and legs.  Low suspicion for posterior stroke.  Despite multiple attempts and with the patient she cannot ambulate safely secondary to lightheadedness.  She will need to be admitted to the hospital for additional management and IV fluids and time.  Ct Head Wo Contrast  12/29/2014   CLINICAL DATA:  Dizziness, nausea, and head pressure since last night, history hypertension, diabetes mellitus  EXAM: CT HEAD WITHOUT CONTRAST  TECHNIQUE: Contiguous axial images were obtained from the base of the skull through the vertex without intravenous contrast.  COMPARISON:  01/19/2009  FINDINGS: Normal ventricular morphology.  No midline shift or mass effect.  Normal appearance of brain parenchyma.  No intracranial hemorrhage, mass lesion, or evidence acute infarction.  No extra-axial fluid collections.  Sinuses clear and bones unremarkable.  IMPRESSION: No acute intracranial abnormalities.   Electronically Signed   By: Lavonia Dana M.D.   On: 12/29/2014 13:46   I personally reviewed the imaging tests through PACS system I reviewed available ER/hospitalization records through the EMR  Results for orders placed or performed during the hospital encounter of 12/29/14  CBC with Differential  Result Value Ref Range   WBC 8.4 4.0 - 10.5 K/uL   RBC 4.72 3.87 - 5.11 MIL/uL   Hemoglobin 13.6 12.0 - 15.0 g/dL   HCT 41.0 36.0 - 46.0 %   MCV 86.9 78.0 - 100.0 fL   MCH 28.8 26.0 - 34.0 pg   MCHC 33.2 30.0 - 36.0 g/dL   RDW 13.6 11.5 - 15.5 %   Platelets 273 150 - 400 K/uL   Neutrophils Relative % 86 (H) 43 - 77 %   Neutro Abs 7.3 1.7 - 7.7 K/uL   Lymphocytes Relative 10 (L) 12 - 46 %   Lymphs Abs 0.8  0.7 - 4.0 K/uL   Monocytes Relative 4 3 - 12 %   Monocytes Absolute 0.3 0.1 - 1.0 K/uL   Eosinophils Relative 0 0 - 5 %   Eosinophils Absolute 0.0 0.0 - 0.7 K/uL   Basophils Relative 0 0 - 1 %   Basophils Absolute 0.0 0.0 - 0.1 K/uL  Comprehensive metabolic panel  Result Value Ref Range   Sodium 139 135 - 145 mmol/L   Potassium 4.0 3.5 - 5.1 mmol/L   Chloride 100 (L) 101 - 111 mmol/L   CO2 29 22 - 32 mmol/L   Glucose, Bld 287 (H) 65 - 99 mg/dL   BUN 14 6 - 20 mg/dL   Creatinine, Ser 0.99 0.44 - 1.00 mg/dL   Calcium 9.1 8.9 - 10.3 mg/dL   Total Protein 7.0 6.5 - 8.1 g/dL   Albumin 3.9 3.5 - 5.0 g/dL   AST 18 15 - 41 U/L   ALT 19 14 - 54 U/L   Alkaline Phosphatase 53 38 - 126 U/L   Total Bilirubin 0.4 0.3 - 1.2 mg/dL   GFR calc non Af Amer 55 (L) >60 mL/min   GFR calc Af Amer >60 >60 mL/min   Anion gap 10 5 - 15  Urinalysis, Routine w reflex microscopic (not at [facility])  Result Value Ref Range   Color, Urine COLORLESS (A) YELLOW  APPearance CLEAR CLEAR   Specific Gravity, Urine 1.021 1.005 - 1.030   pH 7.5 5.0 - 8.0   Glucose, UA >1000 (A) NEGATIVE mg/dL   Hgb urine dipstick NEGATIVE NEGATIVE   Bilirubin Urine NEGATIVE NEGATIVE   Ketones, ur NEGATIVE NEGATIVE mg/dL   Protein, ur NEGATIVE NEGATIVE mg/dL   Urobilinogen, UA 0.2 0.0 - 1.0 mg/dL   Nitrite NEGATIVE NEGATIVE   Leukocytes, UA NEGATIVE NEGATIVE  Urine microscopic-add on  Result Value Ref Range   Squamous Epithelial / LPF FEW (A) RARE   Bacteria, UA MANY (A) RARE  POC CBG, ED  Result Value Ref Range   Glucose-Capillary 238 (H) 65 - 99 mg/dL  CBG monitoring, ED  Result Value Ref Range   Glucose-Capillary 193 (H) 65 - 99 mg/dL     Jola Schmidt, MD 12/29/14 1415

## 2014-12-29 NOTE — ED Notes (Signed)
Pt resting quietly at the time. Reports she continues to feel dizzy. Daughter at bedside. No signs of distress noted.

## 2014-12-29 NOTE — ED Notes (Signed)
Attempted to ambulate pt in hall pt felt dizzy and was unable to ambulate

## 2014-12-29 NOTE — ED Notes (Signed)
Patient transported to MRI 

## 2014-12-29 NOTE — ED Notes (Signed)
cbg is 238

## 2014-12-29 NOTE — ED Provider Notes (Signed)
CSN: [id number]     Arrival date & time 12/29/14  0626 History   First MD Initiated Contact with Patient 12/29/14 [phone]     Chief Complaint  Patient presents with  . Dizziness  . Nausea     (Consider location/radiation/quality/duration/timing/severity/associated sxs/prior Treatment) HPI  This is a 74 year old female with a history of hypertension, diabetes, hypercholesterolemia who presents with generalized weakness, dizziness. Patient reports onset of symptoms yesterday. She states that yesterday she just generally did not feel good. She has had a chronic cough for which she saw pulmonology and received a prednisone injection yesterday. She states that following that she felt very "queasy." She ate a normal dinner and fell asleep. When she woke up she states that she had difficulty maintaining balance and felt very dizzy upon standing up. At that time she did not have any room spinning dizziness but reported that was more off balance. She states that she did fall sleep but when she woke up the symptoms were worse. Upon EMS arrival, she was vomiting. She also had one episode of diarrhea. She denies any abdominal pain. No known history of vertigo. She denies any recent illnesses.  Past Medical History  Diagnosis Date  . Palpitation   . Hypercholesteremia   . Diabetes mellitus without complication   . Hypertension   . Asthma    Past Surgical History  Procedure Laterality Date  . Wisdom tooth extraction     Family History  Problem Relation Age of Onset  . Diabetes Mother   . Hypertension Brother   . Diabetes Brother    History  Substance Use Topics  . Smoking status: Never Smoker   . Smokeless tobacco: Never Used  . Alcohol Use: No   OB History    Gravida Para Term Preterm AB TAB SAB Ectopic Multiple Living   3 3        3      Review of Systems  Constitutional: Negative for fever.  Respiratory: Positive for cough. Negative for chest tightness and shortness of breath.    Cardiovascular: Negative for chest pain.  Gastrointestinal: Positive for nausea, vomiting and diarrhea. Negative for abdominal pain.  Genitourinary: Negative for dysuria.  Musculoskeletal: Negative for back pain.  Neurological: Positive for dizziness and light-headedness. Negative for speech difficulty, weakness, numbness and headaches.  Psychiatric/Behavioral: Negative for confusion.  All other systems reviewed and are negative.     Allergies  Penicillins  Home Medications   Prior to Admission medications   Medication Sig Start Date End Date Taking? Authorizing Provider  acyclovir ointment (ZOVIRAX) 5 % Apply 1 application topically every 3 (three) hours.    Historical Provider, MD  albuterol (PROVENTIL HFA;VENTOLIN HFA) 108 (90 BASE) MCG/ACT inhaler Inhale into the lungs every 6 (six) hours as needed for wheezing or shortness of breath.    Historical Provider, MD  albuterol (PROVENTIL) (2.5 MG/3ML) 0.083% nebulizer solution Take 2.5 mg by nebulization every 6 (six) hours as needed for wheezing or shortness of breath.    Historical Provider, MD  aspirin 81 MG tablet Take 81 mg by mouth daily.    Historical Provider, MD  atorvastatin (LIPITOR) 20 MG tablet  08/31/14   Historical Provider, MD  B Complex-Lysine-Zn-FA (BIOTALAN CLEAR) LIQD Take by mouth.    Historical Provider, MD  baclofen (LIORESAL) 10 MG tablet  09/10/14   Historical Provider, MD  benzonatate (TESSALON) 100 MG capsule  10/02/14   Historical Provider, MD  cefUROXime (CEFTIN) 250 MG tablet Take 250 mg by  mouth 2 (two) times daily. 09/12/14   Historical Provider, MD  CHERATUSSIN AC 100-10 MG/5ML syrup  08/15/14   Historical Provider, MD  dextromethorphan (DELSYM) 30 MG/5ML liquid Take by mouth as needed for cough.    Historical Provider, MD  flecainide (TAMBOCOR) 150 MG tablet  07/25/14   Historical Provider, MD  fluticasone (FLONASE) 50 MCG/ACT nasal spray Place into both nostrils daily.    Historical Provider, MD  furosemide  (LASIX) 20 MG tablet Take 20 mg by mouth.    Historical Provider, MD  glipiZIDE (GLUCOTROL XL) 5 MG 24 hr tablet  08/29/14   Historical Provider, MD  guaiFENesin (MUCINEX) 600 MG 12 hr tablet Take by mouth 2 (two) times daily.    Historical Provider, MD  HYDROcodone-homatropine Judithe Modest) 5-1.5 MG/5ML syrup  09/21/14   Historical Provider, MD  hydrOXYzine (ATARAX/VISTARIL) 25 MG tablet Take 25 mg by mouth 3 (three) times daily as needed.    Historical Provider, MD  levalbuterol Penne Lash) 1.25 MG/3ML nebulizer solution  07/25/14   Historical Provider, MD  losartan-hydrochlorothiazide Konrad Penta) 100-12.5 MG per tablet  07/26/14   Historical Provider, MD  montelukast (SINGULAIR) 10 MG tablet Take 10 mg by mouth at bedtime.    Historical Provider, MD  nystatin (MYCOSTATIN/NYSTOP) 100000 UNIT/GM POWD Apply topically.    Historical Provider, MD  pantoprazole (PROTONIX) 40 MG tablet  09/05/14   Historical Provider, MD  predniSONE (DELTASONE) 10 MG tablet  10/02/14   Historical Provider, MD  Probiotic Product (PROBIOTIC & ACIDOPHILUS EX ST PO) Take by mouth.    Historical Provider, MD  SYMBICORT 160-4.5 MCG/ACT inhaler  09/07/14   Historical Provider, MD  traMADol (ULTRAM) 50 MG tablet Take 50 mg by mouth every 6 (six) hours as needed. 09/10/14   Historical Provider, MD  vitamin B-12 (CYANOCOBALAMIN) 1000 MCG tablet Take 1,000 mcg by mouth daily.    Historical Provider, MD   BP 162/56 mmHg  Pulse 70  Temp(Src) 97.5 F (36.4 C) (Oral)  Resp 20  Ht 5\' 6"  (1.676 m)  Wt 195 lb (88.451 kg)  BMI 31.49 kg/m2  SpO2 99% Physical Exam  Constitutional: She is oriented to person, place, and time. She appears well-developed and well-nourished. No distress.  HENT:  Head: Normocephalic and atraumatic.  Mucous membranes dry  Eyes: EOM are normal. Pupils are equal, round, and reactive to light.  No nystagmus noted  Neck: Neck supple.  Cardiovascular: Normal rate, regular rhythm and normal heart sounds.   Pulmonary/Chest:  Effort normal and breath sounds normal. No respiratory distress. She has no wheezes.  Abdominal: Soft. Bowel sounds are normal. There is no tenderness. There is no rebound.  Neurological: She is alert and oriented to person, place, and time.  Cranial nerves II through XII intact, 5 out of 5 strength in all 4 extremities, no dysmetria to finger-nose-finger, gait testing deferred  Skin: Skin is warm and dry.  Psychiatric: She has a normal mood and affect.  Nursing note and vitals reviewed.   ED Course  Procedures (including critical care time) Labs Review Labs Reviewed  CBC WITH DIFFERENTIAL/PLATELET  COMPREHENSIVE METABOLIC PANEL  URINALYSIS, ROUTINE W REFLEX MICROSCOPIC (NOT AT [facility])  CBG MONITORING, ED    Imaging Review No results found.   EKG Interpretation   Date/Time:  Saturday December 29 2014 06:38:12 EDT Ventricular Rate:  67 PR Interval:  221 QRS Duration: 96 QT Interval:  469 QTC Calculation: 495 R Axis:   -5 Text Interpretation:  Sinus rhythm Prolonged PR interval Left  ventricular  hypertrophy Nonspecific T abnormalities, lateral leads Borderline  prolonged QT interval PR more prolonged when compared to prior Confirmed  by Annice Jolly  MD, [place] ([id number]) on 12/29/2014 6:42:30 AM      MDM   Final diagnoses:  None    Patient presents with nausea and dizziness.  Denies room spinning dizziness. Neurologically intact. Vital signs are reassuring. No evidence of cerebellar dysfunction on exam. She is otherwise nontoxic. Patient given fluids. Orthostatics obtained. EKG shows borderline QT prolongation but otherwise is negative for arrhythmia. Lab work is pending. Low suspicion at this time for stroke as the cause of her dizziness. Suspect volume depletion versus deconditioning. Disposition pending based on workup. Signed out to Dr. Venora Maples.    Merryl Hacker, MD 12/30/14 [phone]

## 2014-12-29 NOTE — ED Notes (Signed)
Per EMS, patient had sudden onset of dizziness and weakness starting last night. Called EMS this morning because she experienced nausea. Reported 1 emesis episode with ems, and 1 of diarrhea. Dizziness started when she stood.

## 2014-12-29 NOTE — H&P (Signed)
Date: 12/29/2014               Patient Name:  Mallory Arellano MRN: [id number]  DOB: 08/28/1940 Age / Sex: 74 y.o., female   PCP: Provider Not In System              Medical Service: Internal Medicine Teaching Service              Attending Physician: Dr. Bartholomew Crews, MD    First Contact: Kathi Simpers, MS 4 Pager: [phone]  Second Contact: Dr. Joni Reining Pager: [phone]       After Hours (After 5p/  First Contact Pager: [phone]  weekends / holidays): Second Contact Pager: [phone]   Chief Complaint: Weakness and dizziness   History of Present Illness: Mallory Arellano is a 74 yo woman with a history of HTN, T2DM, hyperlipidemia, paroxismal atrial fibrillation and asthma who presents with weakness and dizziness. She was in her usual state of health yesterday, went to a pulmonology appointment during which she was noted to have no focal neurological deficits. She received a prednisone injection, then had a smoothie with her daughter and was dropped off at home. Around 8 pm yesterday evening, she was sitting on the couch eating Raisin Bran for dinner and watching television, then fell asleep for a while. When she got up, she felt very lightheaded, nauseous and weak to the point of having to hold on to furniture. She did not feel any less lightheaded or weak when she sat back down. Patient was alone at home at the time; she called her daughter and EMS around 1-2am. Patient had one episode of emesis when EMS arrived, and was transported to [facility].   Of note, her husband died 2 months ago and she has not been taking care of herself as well as she usually does. She has not missed any doses of medications but has not checked her blood sugars. She normally drinks over 8 cups of water a day and has only been drinking 1-2 recently. She has not had any changes in diet or medications. She has had a cough for the past 3 weeks that she attributes to seasonal allergies. She did not feel that the  room was spinning, and turning her head sharply to one side does not elicit dizziness. No palpitations. Family reports that patient was never disoriented, no LOC and her speech remained intact.   In the ED, patient was given 3L of IV fluids but remains too weak to ambulate secondary to weakness and lightheadedness. She was able to walk a few steps with assistance once, but subsequent attempts at sitting or ambulation have been unsuccessful. Her nausea has improved with IV antiemetics. No orthostatic hypotension.    Meds: Current Facility-Administered Medications  Medication Dose Route Frequency Provider Last Rate Last Dose  . aspirin tablet 325 mg  325 mg Oral Once Lucious Groves, DO        Allergies: Allergies as of 12/29/2014 - Review Complete 12/29/2014  Allergen Reaction Noted  . Penicillins Itching 10/16/2014   Past Medical History  Diagnosis Date  . Palpitation   . Hypercholesteremia   . Diabetes mellitus without complication   . Hypertension   . Asthma    Past Surgical History  Procedure Laterality Date  . Wisdom tooth extraction     Family History  Problem Relation Age of Onset  . Diabetes Mother   . Hypertension Brother   . Diabetes Brother  History   Social History  . Marital Status: Married    Spouse Name: N/A  . Number of Children: N/A  . Years of Education: N/A   Occupational History  . Not on file.   Social History Main Topics  . Smoking status: Never Smoker   . Smokeless tobacco: Never Used  . Alcohol Use: No  . Drug Use: No  . Sexual Activity:    Partners: Male    Birth Control/ Protection: Post-menopausal   Other Topics Concern  . Not on file   Social History Narrative    Review of Systems: A comprehensive review of systems was negative except for: ' Respiratory: positive for asthma and cough Neurological: positive for dizziness, gait problems and weakness. Negative for speech disturbances, ringing in ears, or impaired  sensation. Behavioral/Psych: positive for bereavement and increased crying  Physical Exam: General: Alert, cooperative, NAD. HEENT: PERRL, EOMI. Moist mucus membranes. Dentition intact.  Neck: Full range of motion without pain, supple, no lymphadenopathy. Lungs: Clear to ascultation bilaterally, normal work of respiration, no wheezes, rales, rhonchi Heart: RRR, no murmurs, gallops, or rubs Abdomen: Soft, non-tender, non-distended, BS + Extremities: No cyanosis, clubbing, or edema Neurologic: Alert & oriented X3, cranial nerves II-XII intact. Strength 5/5 in all extremities but decreased strength in truncal musculature as patient is unable to sit up without assistance. Sensation intact to light touch and temperature in all extremities. No pronator drift. Positional lightheadedness when patient sits up.   Lab Results:  CBC:  Recent Labs Lab 12/29/14 0645  WBC 8.4  NEUTROABS 7.3  HGB 13.6  HCT 41.0  MCV 86.9  PLT 716    Basic Metabolic Panel:  Recent Labs Lab 12/29/14 0645  NA 139  K 4.0  CL 100*  CO2 29  GLUCOSE 287*  BUN 14  CREATININE 0.99  CALCIUM 9.1    Liver Function Tests:  Recent Labs Lab 12/29/14 0645  AST 18  ALT 19  ALKPHOS 53  BILITOT 0.4  PROT 7.0  ALBUMIN 3.9   No results for input(s): LIPASE, AMYLASE in the last 168 hours. No results for input(s): AMMONIA in the last 168 hours.  Cardiac Enzymes: No results for input(s): CKTOTAL, CKMB, CKMBINDEX, TROPONINI in the last 168 hours.  BNP: Invalid input(s): POCBNP  CBG:  Recent Labs Lab 12/29/14 0711 12/29/14 1047  GLUCAP 238* 193*    Micro Results: No results found for this or any previous visit (from the past 240 hour(s)).  Studies/Results: Ct Head Wo Contrast  12/29/2014   CLINICAL DATA:  Dizziness, nausea, and head pressure since last night, history hypertension, diabetes mellitus  EXAM: CT HEAD WITHOUT CONTRAST  TECHNIQUE: Contiguous axial images were obtained from the base of  the skull through the vertex without intravenous contrast.  COMPARISON:  01/19/2009  FINDINGS: Normal ventricular morphology.  No midline shift or mass effect.  Normal appearance of brain parenchyma.  No intracranial hemorrhage, mass lesion, or evidence acute infarction.  No extra-axial fluid collections.  Sinuses clear and bones unremarkable.  IMPRESSION: No acute intracranial abnormalities.   Electronically Signed   By: Lavonia Dana M.D.   On: 12/29/2014 13:46   Mr Brain Wo Contrast  12/29/2014   CLINICAL DATA:  Patient continues to be lightheaded and nauseated when she stands up. I suspect much of this is ongoing volume depletion from poorly controlled diabetes over the past 3-4 weeks. Much of this may also be somatic in nature secondary to grief reaction of her husband's death one  month ago.  Dizziness R42 (ICD-10-CM)  EXAM: MRI HEAD WITHOUT CONTRAST  TECHNIQUE: Multiplanar, multiecho pulse sequences of the brain and surrounding structures were obtained without intravenous contrast.  COMPARISON:  CT head 12/29/2014.  FINDINGS: No evidence for acute infarction, hemorrhage, hydrocephalus, or extra-axial fluid. Mild cerebral and cerebellar atrophy.  Incidental finding noted near the vertex, is a 12 x 13 x 4 mm RIGHT parietal parasagittal extra-axial mass, without significant compression of the underlying brain, consistent with a small incidental meningioma. No ingrowth into the superior sagittal sinus. No regional vasogenic edema.  Mild chronic microvascular ischemic change. Flow voids are maintained throughout the carotid, basilar, and vertebral arteries. There are no areas of chronic hemorrhage. Pituitary and cerebellar tonsils unremarkable. Mild cervical spondylosis. Extracranial soft tissues unremarkable. Trace BILATERAL mastoid effusions.  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  No acute intracranial findings.  Mild atrophy and small vessel disease.  Mild cervical  spondylosis.   Electronically Signed   By: Staci Righter M.D.   On: 12/29/2014 16:48    Other results: EKG: sinus rhythm, HR 67, new finding of prolonged PR interval of 221 suggestive of 1st degree heart block, QRS 96, QT/QTc 469/496, LVH.   Assessment/Plan: DYONNA JASPERS is a 74 y.o. yo female with a history of HTN, T2DM, hyperlipidemia, paroxismal atrial fibrillation on aspirin and asthma  who was admitted on 12/29/2014 with symptoms of lightheadedness and weakness.  Dizziness and weakness: Due to the positional nature of symptoms, likely etiology is volume depletion in the context of decreased water intake and poorly controlled diabetes causing osmotic diuresis over the past 3-4 weeks. Electrolytes normal on admission, symptoms did not improve after 3L NS. There may be a psychosomatic component secondary to grief reaction of her husband's death two month ago.Benign paroxysmal vertigo less likely because patient does not feel like the room is spinning. Stroke was considered given risk factors of a-fib, HTN, DM and hyperlipidemia, but CT and MRI are negative. Patient currently has an estimated stroke risk of 5% annually, will be 7.4% when she turns 75 next year.  - Zofran PRN for nausea - NS @ 75 cc/hr - repeat BMP tomorrow am - PT/OT starting tomorrow for weakness and Dix-Hallpike maneuver   Paroxysmal atrial fibrillation: Spoke with patient's cardiologist, he has been trying to convince her to start anticoagulation for some time and agrees with giving Lovenox while inpatient and possibly discharge on anticoagulation.  - Aspirin 81 mg daily - Lovenox 40mg  daily   HTN: Stable in the 130s/60s. No orthostatic hypotension in the ED.  - Continue home losartan, HCTZ - Hold home furosemide  Type 2 DM: While elevated blood sugar on admission could be related to the prednisone injection yesterday, patient has been under stress recently and not been checking her blood sugars regularly. It is likely  that her DM has not been well controlled. -SSI  -hold PO DM medications while inpatient  Asthma:  - Continue home symbicort, albuterol, singulair - Allergy testing underway as an outpatient per pulmonology note  Hyperlipidemia: continue home atorvastatin 20 mg daily   Diet: carb-modified  DVT ppx: Lovenox 40mg  daily  This is a Careers information officer Note.  The care of the patient was discussed with Dr. Heber [place] and the assessment and plan was formulated with their assistance.  Please see their note for official documentation of the patient encounter.   Signed: Kathi Simpers, Med Student 12/29/2014, 5:08 PM

## 2014-12-29 NOTE — ED Notes (Signed)
Pt able to take several steps with assistance, reports she feels very weak all over body. EDP at bedside to evaluate. Vital signs stable. Denies pain. Pt remains alert and oriented x4.

## 2014-12-29 NOTE — ED Notes (Signed)
Dr. Horton at the bedside.  

## 2014-12-29 NOTE — ED Notes (Signed)
Pt reports she continues to be dizzy. Vital signs stable. Denies pain. Pt is alert and oriented x4. Daughter remains at bedside. No signs of distress noted.

## 2014-12-29 NOTE — H&P (Signed)
Date: 12/29/2014               Patient Name:  Mallory Arellano MRN: [id number]  DOB: [birth year] Age / Sex: 74 y.o., female   PCP: Provider Not In System         Medical Service: Internal Medicine Teaching Service         Attending Physician: Dr. Bartholomew Crews, MD    First Contact:  Kathi Simpers, MS4 Pager: [phone]  Second Contact: Dr. Joni Reining Pager: [phone]       After Hours (After 5p/  First Contact Pager: [phone]  weekends / holidays): Second Contact Pager: [phone]   Chief Complaint: nausea  History of Present Illness: Mallory Arellano is a 74 yo female with PMH of PAF (on flecidine for rhythm control, not on A/C), HTN, DM, and asthma who presents from home to the ED with accompanied by her daughter with CC of nausea.  She reports she was at home at 8pm last night in her normal state of health watching TV when she stood up from a chair and started feeling unwell.  She reports that she felt nauseous and dizzy.  She had to hold on to a nearby table to keep her balance otherwise she would have fallen to the ground.  She notes that she called her daughter who then called EMS for transport to the ED.  She notes that when EMS arrived she did have one episode of emesis.  She and her daughter denies any focal weakness, facial droop, slurred speech.  She denied any fever, chills, dysuria.  She does report a cough that has been slightly worse than her normal cough (which has been attributed to allergies in [place] since moving 2 years ago from [place]).  She did see her pulmonologist yesterday and was given some hycodan as well as mucinex (from records) per patient she did received a "prednisone injection" at the doctors office although this is not noted in the care-everywhere record.  She does report that her husband passed away 2 months ago after many years of declining health with heart failure and kidney failure.  She does report some depressed feeling and adjustment but reports she has  strong family support.  She also reports eating a more liberal diet recently as friends have brought various foods but notes that she has been taking all of her medications. In the ED orthostatic vitals were checked and negative.  She did appear to have subjective orthostatics and she was given 3 L of NS, a CT of her head was normal appearing and she failed a trial of Zofran, Reglan, and Meclizine.  Given her failure to improve EDP asked IMTS to admit for further observation and workup.   Meds: No current facility-administered medications for this encounter.   Current Outpatient Prescriptions  Medication Sig Dispense Refill  . albuterol (PROVENTIL HFA;VENTOLIN HFA) 108 (90 BASE) MCG/ACT inhaler Inhale into the lungs every 6 (six) hours as needed for wheezing or shortness of breath.    Marland Kitchen albuterol (PROVENTIL) (2.5 MG/3ML) 0.083% nebulizer solution Take 2.5 mg by nebulization every 6 (six) hours as needed for wheezing or shortness of breath.    Marland Kitchen aspirin 81 MG tablet Take 81 mg by mouth daily.    Marland Kitchen atorvastatin (LIPITOR) 20 MG tablet Take 20 mg by mouth daily.   11  . B Complex-Lysine-Zn-FA (BIOTALAN CLEAR) LIQD Take 1 tablet by mouth daily.     . baclofen (LIORESAL) 10 MG tablet  Take 10 mg by mouth 2 (two) times daily.   0  . CINNAMON PO Take 1 tablet by mouth daily.    . flecainide (TAMBOCOR) 150 MG tablet Take 150 mg by mouth 2 (two) times daily.   11  . fluticasone (FLONASE) 50 MCG/ACT nasal spray Place 1 spray into both nostrils daily.     . furosemide (LASIX) 20 MG tablet Take 20 mg by mouth.    Marland Kitchen glipiZIDE (GLUCOTROL XL) 5 MG 24 hr tablet Take 5 mg by mouth daily with breakfast.   11  . levalbuterol (XOPENEX) 1.25 MG/3ML nebulizer solution Take 1.25 mg by nebulization every 4 (four) hours as needed.   2  . losartan-hydrochlorothiazide (HYZAAR) 100-12.5 MG per tablet Take 1 tablet by mouth daily.   1  . montelukast (SINGULAIR) 10 MG tablet Take 10 mg by mouth at bedtime.    Marland Kitchen ofloxacin  (OCUFLOX) 0.3 % ophthalmic solution Place 1 drop into both eyes 4 (four) times daily.  0  . pantoprazole (PROTONIX) 40 MG tablet Take 40 mg by mouth 2 (two) times daily.   11  . sitaGLIPtin (JANUVIA) 100 MG tablet Take 100 mg by mouth daily.    Marland Kitchen Specialty Vitamins Products (GLYCOTROL PO) Take 1 tablet by mouth daily.    . SYMBICORT 160-4.5 MCG/ACT inhaler Inhale 2 puffs into the lungs daily.   5  . acyclovir ointment (ZOVIRAX) 5 % Apply 1 application topically every 3 (three) hours.    . benzonatate (TESSALON) 100 MG capsule   1  . cefUROXime (CEFTIN) 250 MG tablet Take 250 mg by mouth 2 (two) times daily.  0  . CHERATUSSIN AC 100-10 MG/5ML syrup   0  . dextromethorphan (DELSYM) 30 MG/5ML liquid Take by mouth as needed for cough.    Marland Kitchen guaiFENesin (MUCINEX) 600 MG 12 hr tablet Take by mouth 2 (two) times daily.    Marland Kitchen HYDROcodone-homatropine (HYCODAN) 5-1.5 MG/5ML syrup   0  . hydrOXYzine (ATARAX/VISTARIL) 25 MG tablet Take 25 mg by mouth 3 (three) times daily as needed.    . nystatin (MYCOSTATIN/NYSTOP) 100000 UNIT/GM POWD Apply topically.    . predniSONE (DELTASONE) 10 MG tablet Take 10 mg by mouth daily with breakfast.   0  . Probiotic Product (PROBIOTIC & ACIDOPHILUS EX ST PO) Take 1 tablet by mouth daily.     . traMADol (ULTRAM) 50 MG tablet Take 50 mg by mouth every 6 (six) hours as needed.  0  . vitamin B-12 (CYANOCOBALAMIN) 1000 MCG tablet Take 1,000 mcg by mouth daily.      Allergies: Allergies as of 12/29/2014 - Review Complete 12/29/2014  Allergen Reaction Noted  . Penicillins Itching 10/16/2014   Past Medical History  Diagnosis Date  . Palpitation   . Hypercholesteremia   . Diabetes mellitus without complication   . Hypertension   . Asthma    Past Surgical History  Procedure Laterality Date  . Wisdom tooth extraction     Family History  Problem Relation Age of Onset  . Diabetes Mother   . Hypertension Brother   . Diabetes Brother    History   Social History  .  Marital Status: Married    Spouse Name: N/A  . Number of Children: N/A  . Years of Education: N/A   Occupational History  . Not on file.   Social History Main Topics  . Smoking status: Never Smoker   . Smokeless tobacco: Never Used  . Alcohol Use: No  . Drug  Use: No  . Sexual Activity:    Partners: Male    Birth Control/ Protection: Post-menopausal   Other Topics Concern  . Not on file   Social History Narrative    Review of Systems: Review of Systems  Constitutional: Positive for malaise/fatigue. Negative for fever and chills.  HENT: Negative for hearing loss and tinnitus.   Eyes: Negative for blurred vision and double vision.  Respiratory: Positive for cough. Negative for shortness of breath.   Cardiovascular: Negative for chest pain.  Gastrointestinal: Positive for nausea and vomiting. Negative for abdominal pain, diarrhea and constipation.  Genitourinary: Positive for frequency. Negative for dysuria.  Neurological: Positive for dizziness and weakness. Negative for tingling, sensory change, speech change, focal weakness, seizures, loss of consciousness and headaches.     Physical Exam: Blood pressure 149/70, pulse 63, temperature 97.5 F (36.4 C), temperature source Oral, resp. rate 18, height 5\' 6"  (1.676 m), weight 88.451 kg (195 lb), SpO2 98 %. Physical Exam  Constitutional: She is oriented to person, place, and time and well-developed, well-nourished, and in no distress. No distress.  Dizziness with sitting up in bed, no nystagmus, no sensation of vertigo  HENT:  Head: Normocephalic and atraumatic.  Eyes: Pupils are equal, round, and reactive to light.  Cardiovascular: Normal rate and regular rhythm.   No murmur heard. Pulmonary/Chest: Effort normal and breath sounds normal. She has no wheezes.  Abdominal: Soft. Bowel sounds are normal.  Musculoskeletal: Normal range of motion. She exhibits no edema.  Neurological: She is alert and oriented to person, place,  and time. She has normal reflexes. She displays no weakness and normal reflexes. No cranial nerve deficit. She has a normal Cerebellar Exam. GCS score is 15.  Gait and romberg not assessed as transport to MRI had arrived.  Skin: She is not diaphoretic.  Nursing note and vitals reviewed.    Lab results: Basic Metabolic Panel:  Recent Labs  12/29/14 0645  NA 139  K 4.0  CL 100*  CO2 29  GLUCOSE 287*  BUN 14  CREATININE 0.99  CALCIUM 9.1   Liver Function Tests:  Recent Labs  12/29/14 0645  AST 18  ALT 19  ALKPHOS 53  BILITOT 0.4  PROT 7.0  ALBUMIN 3.9   CBC:  Recent Labs  12/29/14 0645  WBC 8.4  NEUTROABS 7.3  HGB 13.6  HCT 41.0  MCV 86.9  PLT 273   CBG:  Recent Labs  12/29/14 0711 12/29/14 1047  GLUCAP 238* 193*   Urinalysis:  Recent Labs  12/29/14 0847  COLORURINE COLORLESS*  LABSPEC 1.021  PHURINE 7.5  GLUCOSEU >1000*  HGBUR NEGATIVE  BILIRUBINUR NEGATIVE  KETONESUR NEGATIVE  PROTEINUR NEGATIVE  UROBILINOGEN 0.2  NITRITE NEGATIVE  LEUKOCYTESUR NEGATIVE    Imaging results:  Ct Head Wo Contrast  12/29/2014   CLINICAL DATA:  Dizziness, nausea, and head pressure since last night, history hypertension, diabetes mellitus  EXAM: CT HEAD WITHOUT CONTRAST  TECHNIQUE: Contiguous axial images were obtained from the base of the skull through the vertex without intravenous contrast.  COMPARISON:  01/19/2009  FINDINGS: Normal ventricular morphology.  No midline shift or mass effect.  Normal appearance of brain parenchyma.  No intracranial hemorrhage, mass lesion, or evidence acute infarction.  No extra-axial fluid collections.  Sinuses clear and bones unremarkable.  IMPRESSION: No acute intracranial abnormalities.   Electronically Signed   By: Lavonia Dana M.D.   On: 12/29/2014 13:46    Other results: EKG: NSR, 1st degree block, TWI  in lead avL, TW flattening in V4-6, no previous available for comparison  Assessment & Plan by Problem:   Nausea and  Dizziness - She has had persistent nausea and dizziness despite 3 L of IVF.  Electrolyte appear wnl, Glucose mildly elevated.  At this point given her risk factors for stroke including PAF NOT on A/C I am concerned for a possible acute CVA as the cause. -  Obtain MRI of head - Give ASA 325, can resume 81mg  tomorrow. - Continue IVF - Antiemetics - PT and OT eval in AM - ? If side effect of hycodan  Paroxsymal A fib - This patients CHA2DS2-VASc Score and unadjusted Ischemic Stroke Rate (% per year) is equal to 4.8 % stroke rate/year from a score of 4 - She is not on A/C, unclear of the reason for this, she reports a history of an adverse reaction to what sound like a beta blocker.  Records from care everywhere reveal (her cardiologist) state that she is resistant to A/C and was inherited off A/C and on Flecainide.  I called and spoke with her Cardiologist Dr Clayborn Bigness who reports that she does not have a contraindication to A/C (apparently her husband may have a bleed is part of her resistance) but that he has encouraged her start eliquis unsuccessfully and has currently left her with the recommendation that any change she will need to start A/C.  He reports if we feel this is a possible TIA or CVA that this may be the opportunity to convince her to start A/C. - Will follow up results of MRI, consider starting Eliquis versus heparin drip.    Hypertension, essential, benign - Currently normotensive, will plan to resume home medications if MRi negative    Hypercholesteremia - For now will continue home Lipitor 20mg  daily    Diabetes mellitus type 2 with neurological manifestations - Hold glipizide and januvia - Start Moderate SSI  DVT: lovenox Diet: Carb mod Code: full Dispo: Disposition is deferred at this time, awaiting improvement of current medical problems. Anticipated discharge in approximately 1-2 day(s).   The patient does have a current PCP (Provider Not In System) and does not need  an [facility] hospital follow-up appointment after discharge.  The patient does not have transportation limitations that hinder transportation to clinic appointments.  Signed: Lucious Groves, DO 12/29/2014, 2:46 PM

## 2014-12-29 NOTE — ED Notes (Signed)
Attempted to sit pt up on side of bed to ambulate in hall.  Pt reports being too dizzy to stand.  MD made aware.

## 2014-12-29 NOTE — ED Notes (Signed)
Pt unable to stand for orthostatic vital signs. Pt crying at the time. Daughter at bedside.

## 2014-12-29 NOTE — ED Notes (Signed)
12 lead by ems unremarkable, 18g in left ac, and patient received approximately 532mL of normal saline. 4mg  of zofran given en route.

## 2014-12-29 NOTE — ED Notes (Signed)
MRI will come for patient.  Darnelle Spangle RN called and advised.

## 2014-12-30 DIAGNOSIS — J309 Allergic rhinitis, unspecified: Secondary | ICD-10-CM | POA: Diagnosis not present

## 2014-12-30 DIAGNOSIS — Z79899 Other long term (current) drug therapy: Secondary | ICD-10-CM

## 2014-12-30 DIAGNOSIS — R110 Nausea: Secondary | ICD-10-CM

## 2014-12-30 DIAGNOSIS — R42 Dizziness and giddiness: Secondary | ICD-10-CM

## 2014-12-30 DIAGNOSIS — E869 Volume depletion, unspecified: Secondary | ICD-10-CM | POA: Diagnosis not present

## 2014-12-30 DIAGNOSIS — R262 Difficulty in walking, not elsewhere classified: Secondary | ICD-10-CM | POA: Diagnosis not present

## 2014-12-30 DIAGNOSIS — E780 Pure hypercholesterolemia: Secondary | ICD-10-CM

## 2014-12-30 DIAGNOSIS — I10 Essential (primary) hypertension: Secondary | ICD-10-CM

## 2014-12-30 DIAGNOSIS — I480 Paroxysmal atrial fibrillation: Secondary | ICD-10-CM | POA: Diagnosis present

## 2014-12-30 DIAGNOSIS — E1149 Type 2 diabetes mellitus with other diabetic neurological complication: Secondary | ICD-10-CM

## 2014-12-30 LAB — BASIC METABOLIC PANEL
ANION GAP: 8 (ref 5–15)
BUN: 13 mg/dL (ref 6–20)
CHLORIDE: 107 mmol/L (ref 101–111)
CO2: 27 mmol/L (ref 22–32)
CREATININE: 0.85 mg/dL (ref 0.44–1.00)
Calcium: 8.7 mg/dL — ABNORMAL LOW (ref 8.9–10.3)
GFR calc Af Amer: >60 mL/min (ref >60)
GFR calc non Af Amer: >60 mL/min (ref >60)
Glucose, Bld: 156 mg/dL — ABNORMAL HIGH (ref 65–99)
POTASSIUM: 3.7 mmol/L (ref 3.5–5.1)
SODIUM: 142 mmol/L (ref 135–145)

## 2014-12-30 LAB — GLUCOSE, CAPILLARY
Glucose-Capillary: 130 mg/dL — ABNORMAL HIGH (ref 65–99)
Glucose-Capillary: 180 mg/dL — ABNORMAL HIGH (ref 65–99)
Glucose-Capillary: 124 mg/dL — ABNORMAL HIGH (ref 65–99)
Glucose-Capillary: 169 mg/dL — ABNORMAL HIGH (ref 65–99)

## 2014-12-30 LAB — TROPONIN I: Troponin I: 0.05 ng/mL — ABNORMAL HIGH (ref <0.031)

## 2014-12-30 MED ORDER — APIXABAN 5 MG PO TABS
5.0000 mg | ORAL_TABLET | Freq: Two times a day (BID) | ORAL | Status: DC
  Administered 2014-12-30 – 2014-12-31 (×3): 5 mg via ORAL
  Filled 2014-12-30 (×3): qty 1

## 2014-12-30 NOTE — Progress Notes (Signed)
Subjective: [name]. Patient refusing insulin this morning. She feels that she does not need it because she is not on insulin at home. Agreed on controlling blood sugars with diet while inpatient, because patient's blood glucose levels have been 130s-150s today. Weakness, dizziness and nausea have improved but patient is concerned about falls and does not feel ready to go home today. She has been able to tolerate a diet.   Objective: Vital signs in last 24 hours: Filed Vitals:   12/29/14 1500 12/29/14 1709 12/29/14 2124 12/30/14 0925  BP: 137/61 163/67 169/61 164/68  Pulse: 72 81 68 63  Temp:  98.6 F (37 C) 98.2 F (36.8 C) 98.4 F (36.9 C)  TempSrc:  Oral Oral Oral  Resp:  20 18 18   Height:  5\' 6"  (1.676 m)    Weight:  90.5 kg (199 lb 8.3 oz) 92 kg (202 lb 13.2 oz)   SpO2: 100% 100% 100% 100%   Weight change: 2.049 kg (4 lb 8.3 oz)  Intake/Output Summary (Last 24 hours) at 12/30/14 1132 Last data filed at 12/30/14 0900  Gross per 24 hour  Intake   1240 ml  Output    650 ml  Net    590 ml    Physical Exam: General: Alert, cooperative, NAD. HEENT: PERRL, EOMI. Moist mucus membranes Lungs: Clear to ascultation bilaterally, normal work of respiration, no wheezes, rales, rhonchi Heart: RRR, no murmurs, gallops, or rubs Abdomen: Soft, non-tender, non-distended, BS + Extremities: No cyanosis, clubbing, or edema Neurologic: Alert & oriented X3, strength grossly intact, sensation intact to light touch  Lab Results:  CBC:  Recent Labs Lab 12/29/14 0645  WBC 8.4  NEUTROABS 7.3  HGB 13.6  HCT 41.0  MCV 86.9  PLT 631    Basic Metabolic Panel:  Recent Labs Lab 12/29/14 0645 12/30/14 0815  NA 139 142  K 4.0 3.7  CL 100* 107  CO2 29 27  GLUCOSE 287* 156*  BUN 14 13  CREATININE 0.99 0.85  CALCIUM 9.1 8.7*    Liver Function Tests:  Recent Labs Lab 12/29/14 0645  AST 18  ALT 19  ALKPHOS 53  BILITOT 0.4  PROT 7.0  ALBUMIN 3.9   CBG:  Recent Labs Lab  12/29/14 0711 12/29/14 1047 12/29/14 1832 12/29/14 2117 12/30/14 0722  GLUCAP 238* 193* 155* 183* 130*    Micro Results: No results found for this or any previous visit (from the past 240 hour(s)).  Studies/Results: Ct Head Wo Contrast  12/29/2014   CLINICAL DATA:  Dizziness, nausea, and head pressure since last night, history hypertension, diabetes mellitus  EXAM: CT HEAD WITHOUT CONTRAST  TECHNIQUE: Contiguous axial images were obtained from the base of the skull through the vertex without intravenous contrast.  COMPARISON:  01/19/2009  FINDINGS: Normal ventricular morphology.  No midline shift or mass effect.  Normal appearance of brain parenchyma.  No intracranial hemorrhage, mass lesion, or evidence acute infarction.  No extra-axial fluid collections.  Sinuses clear and bones unremarkable.  IMPRESSION: No acute intracranial abnormalities.   Electronically Signed   By: Lavonia Dana M.D.   On: 12/29/2014 13:46   Mr Brain Wo Contrast  12/29/2014   CLINICAL DATA:  Patient continues to be lightheaded and nauseated when she stands up. I suspect much of this is ongoing volume depletion from poorly controlled diabetes over the past 3-4 weeks. Much of this may also be somatic in nature secondary to grief reaction of her husband's death one month ago.  Dizziness R42 (  ICD-10-CM)  EXAM: MRI HEAD WITHOUT CONTRAST  TECHNIQUE: Multiplanar, multiecho pulse sequences of the brain and surrounding structures were obtained without intravenous contrast.  COMPARISON:  CT head 12/29/2014.  FINDINGS: No evidence for acute infarction, hemorrhage, hydrocephalus, or extra-axial fluid. Mild cerebral and cerebellar atrophy.  Incidental finding noted near the vertex, is a 12 x 13 x 4 mm RIGHT parietal parasagittal extra-axial mass, without significant compression of the underlying brain, consistent with a small incidental meningioma. No ingrowth into the superior sagittal sinus. No regional vasogenic edema.  Mild chronic  microvascular ischemic change. Flow voids are maintained throughout the carotid, basilar, and vertebral arteries. There are no areas of chronic hemorrhage. Pituitary and cerebellar tonsils unremarkable. Mild cervical spondylosis. Extracranial soft tissues unremarkable. Trace BILATERAL mastoid effusions.  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  No acute intracranial findings.  Mild atrophy and small vessel disease.  Mild cervical spondylosis.   Electronically Signed   By: Staci Righter M.D.   On: 12/29/2014 16:48    Other results:  EKG on admission: sinus rhythm, HR 67, new finding of prolonged PR interval of 221 suggestive of 1st degree heart block, QRS 96, QT/QTc 469/496.  Medications: I have reviewed the patient's current medications. Scheduled Meds: . apixaban  5 mg Oral BID  . atorvastatin  20 mg Oral Daily  . budesonide-formoterol  2 puff Inhalation Daily  . flecainide  150 mg Oral BID  . losartan  100 mg Oral Daily   And  . hydrochlorothiazide  12.5 mg Oral Daily  . insulin aspart  0-15 Units Subcutaneous TID WC  . insulin aspart  0-5 Units Subcutaneous QHS  . montelukast  10 mg Oral QHS  . ofloxacin  1 drop Both Eyes QID  . pantoprazole  40 mg Oral BID  . sodium chloride  3 mL Intravenous Q12H  . sodium chloride  3 mL Intravenous Q12H   Continuous Infusions:  PRN Meds:.sodium chloride, acetaminophen **OR** acetaminophen, albuterol, feeding supplement (GLUCERNA SHAKE), ondansetron **OR** ondansetron (ZOFRAN) IV, sodium chloride  Assessment/Plan: Mallory Arellano is a 74 y.o. yo female with a history of HTN, T2DM, hyperlipidemia, paroxismal atrial fibrillation on aspirin and asthma who was admitted on 12/29/2014 with symptoms of lightheadedness and weakness.  Dizziness and weakness: Due to the positional nature of symptoms, likely etiology is volume depletion in the context of decreased water intake and poorly controlled diabetes causing osmotic  diuresis over the past 3-4 weeks. Electrolytes normal on admission, symptoms improving slowly with fluid administration. There may be a psychosomatic component secondary to grief reaction of her husband's death two month ago.Benign paroxysmal vertigo less likely because patient does not feel like the room is spinning. Stroke was considered given risk factors of a-fib, HTN, DM and hyperlipidemia, but CT and MRI are negative.  - Zofran PRN for nausea - repeat BMP tomorrow am - PT/OT for weakness and Dix-Hallpike maneuver   Paroxysmal atrial fibrillation: Patient currently has an estimated stroke risk of 5% annually, will be 7.4% when she turns 75 next year. Spoke with patient's cardiologist Dr. Clayborn Bigness, he has been trying to convince her to start anticoagulation for some time. Patient agrees to starting apixaban on discharge.  - Aspirin 81 mg daily - Apixaban 5 mg BID on discharge - f/u with Dr. Clayborn Bigness as an outpatient   HTN: Elevated but not critical at 160s/60s. No orthostatic hypotension. - Continue home losartan, HCTZ - Hold home furosemide  Type 2 DM: While elevated blood  sugar on admission could be related to the prednisone injection yesterday, patient has been under stress recently and not been checking her blood sugars regularly. It is likely that her DM has not been well controlled. -declined SSI  -diet controlled while in hospital -hold PO DM medications while inpatient  Asthma:  - Continue home symbicort, albuterol, singulair - Allergy testing underway as an outpatient per pulmonology note  Hyperlipidemia: continue home atorvastatin 20 mg daily   Diet: carb-modified  DVT ppx: Lovenox 40mg  daily  This is a Careers information officer Note.  The care of the patient was discussed with Dr. Heber Harvey and the assessment and plan formulated with their assistance.  Please see their attached note for official documentation of the daily encounter.   LOS: 1 day   Kathi Simpers, Med  Student 12/30/2014, 11:32 AM

## 2014-12-30 NOTE — Evaluation (Signed)
Physical Therapy Evaluation Patient Details Name: Mallory Arellano MRN: [id number] DOB: 1941-05-01 Today's Date: 12/30/2014   History of Present Illness  Mallory Arellano is a 74 yo female with PMH of PAF (on flecidine for rhythm control, not on A/C), HTN, DM, and asthma who presents from home to the ED with accompanied by her daughter with CC of nausea. She reports she was at home at 8pm last night in her normal state of health watching TV when she stood up from a chair and started feeling unwell. She reports that she felt nauseous and dizzy. She had to hold on to a nearby table to keep her balance otherwise she would have fallen to the ground.  Clinical Impression  Pt admitted with above diagnosis. Pt currently with functional limitations due to the deficits listed below (see PT Problem List).  Pt will benefit from skilled PT to increase their independence and safety with mobility to allow discharge to the venue listed below.       Follow Up Recommendations Home health PT    Equipment Recommendations  Kasandra Knudsen (pt will likely decline a cane)    Recommendations for Other Services       Precautions / Restrictions Precautions Precautions: Fall      Mobility  Bed Mobility Overal bed mobility: Independent                Transfers Overall transfer level: Needs assistance Equipment used: None Transfers: Sit to/from Stand Sit to Stand: Supervision         General transfer comment: Cues to self-monitor for activity tolerance  Ambulation/Gait Ambulation/Gait assistance: Supervision Ambulation Distance (Feet): 150 Feet Assistive device: None Gait Pattern/deviations: Step-through pattern;Decreased step length - right;Decreased step length - left;Decreased stride length   Gait velocity interpretation: Below normal speed for age/gender General Gait Details: slow, guarded gait; noted tendency to stop walking  to talk, which can be indicative of fall risk  Stairs             Wheelchair Mobility    Modified Rankin (Stroke Patients Only)       Balance Overall balance assessment: Needs assistance           Standing balance-Leahy Scale: Fair                               Pertinent Vitals/Pain Pain Assessment: No/denies pain    Home Living Family/patient expects to be discharged to:: Private residence Living Arrangements: Children Available Help at Discharge: Family;Friend(s);Available PRN/intermittently Type of Home: House Home Access: Level entry     Home Layout: One level Home Equipment: None      Prior Function Level of Independence: Independent               Hand Dominance        Extremity/Trunk Assessment   Upper Extremity Assessment: Overall WFL for tasks assessed           Lower Extremity Assessment: Generalized weakness         Communication   Communication: No difficulties  Cognition Arousal/Alertness: Awake/alert Behavior During Therapy: WFL for tasks assessed/performed Overall Cognitive Status: Within Functional Limits for tasks assessed                      General Comments General comments (skin integrity, edema, etc.): Took Orthostatic Vitals as follows:     12/30/14 1618 12/30/14 1622 12/30/14 1623  Orthostatic  Lying   BP- Lying 165/83 mmHg --  --   Pulse- Lying 59 --  --   Orthostatic Sitting  BP- Sitting --  167/72 mmHg --   Pulse- Sitting --  60 --   Orthostatic Standing at 0 minutes  BP- Standing at 0 minutes --  --  169/87 mmHg  Pulse- Standing at 0 minutes --  --  61     12/30/14 1641  Orthostatic Lying   BP- Lying --   Pulse- Lying --   Orthostatic Sitting  BP- Sitting 164/74 mmHg  Pulse- Sitting 59  Orthostatic Standing at 0 minutes  BP- Standing at 0 minutes --   Pulse- Standing at 0 minutes --        Exercises        Assessment/Plan    PT Assessment Patient needs continued PT services  PT Diagnosis Abnormality of gait   PT Problem List  Decreased strength;Decreased activity tolerance;Decreased balance;Decreased mobility;Decreased knowledge of use of DME  PT Treatment Interventions DME instruction;Gait training;Stair training;Functional mobility training;Therapeutic activities;Therapeutic exercise;Patient/family education   PT Goals (Current goals can be found in the Care Plan section) Acute Rehab PT Goals Patient Stated Goal: wants to be more active; would like to have a regular exercise regimen PT Goal Formulation: With patient Time For Goal Achievement: 01/13/15 Potential to Achieve Goals: Good    Frequency Min 3X/week   Barriers to discharge        Co-evaluation               End of Session   Activity Tolerance: Patient tolerated treatment well Patient left: in bed;with call bell/phone within reach Nurse Communication: Mobility status    Functional Assessment Tool Used: Clinical Judgement Functional Limitation: Mobility: Walking and moving around Mobility: Walking and Moving Around Current Status (V4259): At least 1 percent but less than 20 percent impaired, limited or restricted Mobility: Walking and Moving Around Goal Status [phone]): 0 percent impaired, limited or restricted    Time: 5643-3295 PT Time Calculation (min) (ACUTE ONLY): 45 min   Charges:   PT Evaluation $Initial PT Evaluation Tier I: 1 Procedure PT Treatments $Gait Training: 8-22 mins $Therapeutic Activity: 8-22 mins   PT G Codes:   PT G-Codes **NOT FOR INPATIENT CLASS** Functional Assessment Tool Used: Clinical Judgement Functional Limitation: Mobility: Walking and moving around Mobility: Walking and Moving Around Current Status (J8841): At least 1 percent but less than 20 percent impaired, limited or restricted Mobility: Walking and Moving Around Goal Status [phone]): 0 percent impaired, limited or restricted    Roney Marion [facility] 12/30/2014, 5:57 PM  Roney Marion, [place] Pager [phone] Office  [phone]

## 2014-12-30 NOTE — Progress Notes (Signed)
  Date: 12/30/2014  Patient name: Mallory Arellano  Medical record number: [id number]  Date of birth: [birth year]   I have seen and evaluated Mallory Arellano and discussed their care with the Residency Team. [name] is a very young appearing 74 yo with PAF not on AC, HTN, DM, GERD, asthma, and allergies. She recently moved to [place] about 2 yrs ago from [place] and her allergies have been bothering her more. She has been seeing her pul and is on appropriate tx. Her husband passed away about 2 months ago and she has not been as diligent managing her health (CBG's and diet) since then. She was in her usual state of health until day of admit when she stood up night of admit and felt nauseous, dizzy, and unsteady. Her daughter called 911 and in the ambulance, she had one episode of emesis. In the ED, she received 3 L IVF and after the first L, she was not orthostatic. However, she was unable to walk 2/2 weakness and lightheadedness and IM was called to admit. This AM, she tolerate oatmeal for breakfast but did feel a bit queasy on her stomach. She has not received any Zofran since the ED. She has not walked since the ED but has used bedside toilet with assistance. She is now able to sit up without sxs.   Filed Vitals:   12/30/14 0925  BP: 164/68  Pulse: 63  Temp: 98.4 F (36.9 C)  Resp: 18   Gen [place]asant, NAD. Able to move in bed, roll over to side without issue HRRR no MRG LCTAB Ext no edema ABD + BS  Assessment and Plan: I have seen and evaluated the patient as outlined above. I agree with the formulated Assessment and Plan as detailed in the residents' admission note, with the following changes:   1. Nausea, dizziness, and inability to walk - MRI has R/O posterior CVA.  She has improved with symptomatic tx and time. PT and OT consults are pending. Etiology not clear as she had no h/o vol contraction and was not orthostatic. She did not have a stroke. She had no sxs of vertigo. EKG only with non specific changes  (lateral TWI and flattening in precordial leads) and no CP - will check one trop I. Cont current tx and F/U after PT and OT evals pt.  2. Allergies - pul has her on appropriate tx and she may F/U with them.  3. A Fib - she is rate controlled on her flecainide and has agreed to a DOAC. F/U her cardiologist.   Likely D/C 8/8.  Bartholomew Crews, MD 8/7/201612:10 PM

## 2014-12-31 DIAGNOSIS — R42 Dizziness and giddiness: Secondary | ICD-10-CM | POA: Diagnosis not present

## 2014-12-31 DIAGNOSIS — R2689 Other abnormalities of gait and mobility: Secondary | ICD-10-CM | POA: Diagnosis not present

## 2014-12-31 DIAGNOSIS — E869 Volume depletion, unspecified: Secondary | ICD-10-CM | POA: Diagnosis not present

## 2014-12-31 LAB — GLUCOSE, CAPILLARY
GLUCOSE-CAPILLARY: 163 mg/dL — AB (ref 65–99)
GLUCOSE-CAPILLARY: 223 mg/dL — AB (ref 65–99)

## 2014-12-31 MED ORDER — SORBITOL 70 % SOLN: 60.0000 mL | Freq: Once | Status: DC

## 2014-12-31 MED ORDER — APIXABAN 5 MG PO TABS: 5.0000 mg | ORAL_TABLET | Freq: Two times a day (BID) | ORAL | Status: DC

## 2014-12-31 MED ORDER — POLYETHYLENE GLYCOL 3350 17 G PO PACK
17.0000 g | PACK | Freq: Two times a day (BID) | ORAL | Status: DC
  Administered 2014-12-31: 17 g via ORAL
  Filled 2014-12-31: qty 1

## 2014-12-31 MED ORDER — BISACODYL 10 MG RE SUPP: 10.0000 mg | Freq: Once | RECTAL | Status: DC

## 2014-12-31 NOTE — Progress Notes (Signed)
Subjective: [name]. Patient eating breakfast, was able to ambulate with PT yesterday. Mildly dizzy. Had nausea earlier this morning but improved later in the day.   Objective: Vital signs in last 24 hours: Filed Vitals:   12/30/14 1802 12/30/14 2039 12/31/14 0435 12/31/14 0956  BP: 155/66 148/58 154/68 151/70  Pulse: 66 62 62 66  Temp: 98.4 F (36.9 C) 98.2 F (36.8 C) 98.1 F (36.7 C) 98.2 F (36.8 C)  TempSrc: Oral Oral Oral Oral  Resp: 18 18 16 18   Height:      Weight:  91.491 kg (201 lb 11.2 oz)    SpO2: 100% 98% 97% 98%   Weight change: 0.991 kg (2 lb 2.9 oz)  Intake/Output Summary (Last 24 hours) at 12/31/14 1303 Last data filed at 12/31/14 0900  Gross per 24 hour  Intake    942 ml  Output      1 ml  Net    941 ml    Physical Exam: General: Alert, cooperative, NAD. HEENT: PERRL, EOMI. Moist mucus membranes Lungs: Clear to ascultation bilaterally, normal work of respiration, no wheezes, rales, rhonchi Heart: RRR, no murmurs, gallops, or rubs Abdomen: Soft, non-tender, non-distended, BS + Extremities: No cyanosis, clubbing, or edema Neurologic: Alert & oriented X3, strength grossly intact, sensation intact to light touch  Lab Results:  CBC:  Recent Labs Lab 12/29/14 0645  WBC 8.4  NEUTROABS 7.3  HGB 13.6  HCT 41.0  MCV 86.9  PLT 169    Basic Metabolic Panel:  Recent Labs Lab 12/29/14 0645 12/30/14 0815  NA 139 142  K 4.0 3.7  CL 100* 107  CO2 29 27  GLUCOSE 287* 156*  BUN 14 13  CREATININE 0.99 0.85  CALCIUM 9.1 8.7*    Liver Function Tests:  Recent Labs Lab 12/29/14 0645  AST 18  ALT 19  ALKPHOS 53  BILITOT 0.4  PROT 7.0  ALBUMIN 3.9   CBG:  Recent Labs Lab 12/30/14 1133 12/30/14 1624 12/30/14 2035 12/31/14 0727 12/31/14 1152  GLUCAP 180* 124* 169* 163* 223*    Micro Results: No results found for this or any previous visit (from the past 240 hour(s)).  Studies/Results: Ct Head Wo Contrast  12/29/2014   CLINICAL  DATA:  Dizziness, nausea, and head pressure since last night, history hypertension, diabetes mellitus  EXAM: CT HEAD WITHOUT CONTRAST  TECHNIQUE: Contiguous axial images were obtained from the base of the skull through the vertex without intravenous contrast.  COMPARISON:  01/19/2009  FINDINGS: Normal ventricular morphology.  No midline shift or mass effect.  Normal appearance of brain parenchyma.  No intracranial hemorrhage, mass lesion, or evidence acute infarction.  No extra-axial fluid collections.  Sinuses clear and bones unremarkable.  IMPRESSION: No acute intracranial abnormalities.   Electronically Signed   By: Lavonia Dana M.D.   On: 12/29/2014 13:46   Mr Brain Wo Contrast  12/29/2014   CLINICAL DATA:  Patient continues to be lightheaded and nauseated when she stands up. I suspect much of this is ongoing volume depletion from poorly controlled diabetes over the past 3-4 weeks. Much of this may also be somatic in nature secondary to grief reaction of her husband's death one month ago.  Dizziness R42 (ICD-10-CM)  EXAM: MRI HEAD WITHOUT CONTRAST  TECHNIQUE: Multiplanar, multiecho pulse sequences of the brain and surrounding structures were obtained without intravenous contrast.  COMPARISON:  CT head 12/29/2014.  FINDINGS: No evidence for acute infarction, hemorrhage, hydrocephalus, or extra-axial fluid. Mild cerebral and cerebellar atrophy.  Incidental finding noted near the vertex, is a 12 x 13 x 4 mm RIGHT parietal parasagittal extra-axial mass, without significant compression of the underlying brain, consistent with a small incidental meningioma. No ingrowth into the superior sagittal sinus. No regional vasogenic edema.  Mild chronic microvascular ischemic change. Flow voids are maintained throughout the carotid, basilar, and vertebral arteries. There are no areas of chronic hemorrhage. Pituitary and cerebellar tonsils unremarkable. Mild cervical spondylosis. Extracranial soft tissues unremarkable. Trace  BILATERAL mastoid effusions.  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  No acute intracranial findings.  Mild atrophy and small vessel disease.  Mild cervical spondylosis.   Electronically Signed   By: Staci Righter M.D.   On: 12/29/2014 16:48    Other results:  EKG on admission: sinus rhythm, HR 67, new finding of prolonged PR interval of 221 suggestive of 1st degree heart block, QRS 96, QT/QTc 469/496.  Medications: I have reviewed the patient's current medications. Scheduled Meds: . apixaban  5 mg Oral BID  . atorvastatin  20 mg Oral Daily  . budesonide-formoterol  2 puff Inhalation Daily  . flecainide  150 mg Oral BID  . losartan  100 mg Oral Daily   And  . hydrochlorothiazide  12.5 mg Oral Daily  . montelukast  10 mg Oral QHS  . ofloxacin  1 drop Both Eyes QID  . pantoprazole  40 mg Oral BID  . polyethylene glycol  17 g Oral BID  . sodium chloride  3 mL Intravenous Q12H  . sodium chloride  3 mL Intravenous Q12H   Continuous Infusions:  PRN Meds:.sodium chloride, acetaminophen **OR** acetaminophen, albuterol, feeding supplement (GLUCERNA SHAKE), ondansetron **OR** ondansetron (ZOFRAN) IV, sodium chloride  Assessment/Plan: Mallory Arellano is a 75 y.o. yo female with a history of HTN, T2DM, hyperlipidemia, paroxismal atrial fibrillation on aspirin and asthma who was admitted on 12/29/2014 with symptoms of lightheadedness and weakness.  Dizziness and weakness: Due to the positional nature of symptoms, likely etiology is volume depletion in the context of decreased water intake and poorly controlled diabetes causing osmotic diuresis over the past 3-4 weeks. Electrolytes normal on admission, symptoms improving slowly with fluid administration. There may be a psychosomatic component secondary to grief reaction of her husband's death two month ago. Stroke was considered given risk factors of a-fib, HTN, DM and hyperlipidemia, but CT and MRI are negative.  Patient can be discharged to home with home health today.  - Zofran PRN for nausea - PT/OT eval completed, patient can be discharged with home health and a cane   Paroxysmal atrial fibrillation: Patient currently has an estimated stroke risk of 5% annually, will be 7.4% when she turns 75 next year. Spoke with patient's cardiologist Dr. Clayborn Bigness, he has been trying to convince her to start anticoagulation for some time. Patient agrees to starting apixaban on discharge.  - Aspirin 81 mg daily - Apixaban 5 mg BID on discharge - f/u with Dr. Clayborn Bigness as an outpatient   HTN: Stable in 150s/60s. No orthostatic hypotension. - Continue home losartan, HCTZ - Restart home furosemide on discharge   Type 2 DM: While elevated blood sugar on admission could be related to the prednisone injection yesterday, patient has been under stress recently and not been checking her blood sugars regularly. It is likely that her DM has not been well controlled. -declined SSI  -diet controlled while in hospital -restarting PO DM medications on discharge   Asthma:  - Continue home symbicort, albuterol, singulair -  Allergy testing underway as an outpatient per pulmonology note  Hyperlipidemia: continue home atorvastatin 20 mg daily   Diet: carb-modified  DVT ppx: Lovenox 40mg  daily  Dispo: home today   This is a Careers information officer Note.  The care of the patient was discussed with Dr. Heber Mantachie and the assessment and plan formulated with their assistance.  Please see their attached note for official documentation of the daily encounter.   LOS: 2 days   Kathi Simpers, Med Student 12/31/2014, 1:03 PM

## 2014-12-31 NOTE — Discharge Instructions (Signed)

## 2014-12-31 NOTE — Discharge Summary (Signed)
Name: Mallory Arellano MRN: [id number] DOB: 10-21-1940 74 y.o. PCP: Provider Not In System  Date of Admission: 12/29/2014  6:26 AM Date of Discharge: 12/31/2014 Attending Physician: Annia Belt, MD  Discharge Diagnosis: 1. Dizziness and gait instability  Principal Problem:   Dizziness Active Problems:   Hypertension, essential, benign   Hypercholesteremia   Nausea   Diabetes mellitus type 2 with neurological manifestations   Paroxysmal atrial fibrillation  Discharge Medications:   Medication List    STOP taking these medications        aspirin 81 MG tablet     cefUROXime 250 MG tablet  Commonly known as:  CEFTIN     CHERATUSSIN AC 100-10 MG/5ML syrup  Generic drug:  guaiFENesin-codeine     dextromethorphan 30 MG/5ML liquid  Commonly known as:  DELSYM     GLYCOTROL PO     levalbuterol 1.25 MG/3ML nebulizer solution  Commonly known as:  XOPENEX     predniSONE 10 MG tablet  Commonly known as:  DELTASONE     traMADol 50 MG tablet  Commonly known as:  ULTRAM      TAKE these medications        acyclovir ointment 5 %  Commonly known as:  ZOVIRAX  Apply 1 application topically every 3 (three) hours.     albuterol (2.5 MG/3ML) 0.083% nebulizer solution  Commonly known as:  PROVENTIL  Take 2.5 mg by nebulization every 6 (six) hours as needed for wheezing or shortness of breath.     albuterol 108 (90 BASE) MCG/ACT inhaler  Commonly known as:  PROVENTIL HFA;VENTOLIN HFA  Inhale into the lungs every 6 (six) hours as needed for wheezing or shortness of breath.     apixaban 5 MG Tabs tablet  Commonly known as:  ELIQUIS  Take 1 tablet (5 mg total) by mouth 2 (two) times daily.     atorvastatin 20 MG tablet  Commonly known as:  LIPITOR  Take 20 mg by mouth daily.     baclofen 10 MG tablet  Commonly known as:  LIORESAL  Take 10 mg by mouth 2 (two) times daily.     benzonatate 100 MG capsule  Commonly known as:  TESSALON     BIOTALAN CLEAR Liqd  Take 1  tablet by mouth daily.     CINNAMON PO  Take 1 tablet by mouth daily.     flecainide 150 MG tablet  Commonly known as:  TAMBOCOR  Take 150 mg by mouth 2 (two) times daily.     fluticasone 50 MCG/ACT nasal spray  Commonly known as:  FLONASE  Place 1 spray into both nostrils daily.     furosemide 20 MG tablet  Commonly known as:  LASIX  Take 20 mg by mouth.     glipiZIDE 5 MG 24 hr tablet  Commonly known as:  GLUCOTROL XL  Take 5 mg by mouth daily with breakfast.     guaiFENesin 600 MG 12 hr tablet  Commonly known as:  MUCINEX  Take by mouth 2 (two) times daily.     HYDROcodone-homatropine 5-1.5 MG/5ML syrup  Commonly known as:  HYCODAN     hydrOXYzine 25 MG tablet  Commonly known as:  ATARAX/VISTARIL  Take 25 mg by mouth 3 (three) times daily as needed.     losartan-hydrochlorothiazide 100-12.5 MG per tablet  Commonly known as:  HYZAAR  Take 1 tablet by mouth daily.     montelukast 10 MG tablet  Commonly known as:  SINGULAIR  Take 10 mg by mouth at bedtime.     nystatin 100000 UNIT/GM Powd  Apply topically.     ofloxacin 0.3 % ophthalmic solution  Commonly known as:  OCUFLOX  Place 1 drop into both eyes 4 (four) times daily.     pantoprazole 40 MG tablet  Commonly known as:  PROTONIX  Take 40 mg by mouth 2 (two) times daily.     PROBIOTIC & ACIDOPHILUS EX ST PO  Take 1 tablet by mouth daily.     sitaGLIPtin 100 MG tablet  Commonly known as:  JANUVIA  Take 100 mg by mouth daily.     SYMBICORT 160-4.5 MCG/ACT inhaler  Generic drug:  budesonide-formoterol  Inhale 2 puffs into the lungs daily.     vitamin B-12 1000 MCG tablet  Commonly known as:  CYANOCOBALAMIN  Take 1,000 mcg by mouth daily.        Disposition and follow-up:   Mallory Arellano was discharged from [facility] in Stable condition.  At the hospital follow up visit please address:  1.  Gait instability, DM control, Compliance with Eliquis  2.  Labs / imaging needed at  time of follow-up: None  3.  Pending labs/ test needing follow-up: None  Follow-up Appointments: Follow-up Information    Follow up with Kirk Ruths., MD. Go on 12/31/2014.   Specialty:  Internal Medicine   Why:  9am appt scheduled for hospital f/u.   Contact information:   [place] [address] [phone]       Discharge Instructions: Discharge Instructions    Ambulatory referral to Nutrition and Diabetic Education    Complete by:  As directed   Patient starting Lifestyle change management for DM control. Pt request further education.     Call MD for:  difficulty breathing, headache or visual disturbances    Complete by:  As directed      Call MD for:  extreme fatigue    Complete by:  As directed      Call MD for:  persistant dizziness or light-headedness    Complete by:  As directed      Call MD for:  persistant nausea and vomiting    Complete by:  As directed      Call MD for:  severe uncontrolled pain    Complete by:  As directed      Call MD for:  temperature >100.4    Complete by:  As directed      Diet - low sodium heart healthy    Complete by:  As directed      Increase activity slowly    Complete by:  As directed      Walk with assistance    Complete by:  As directed            Consultations:    Procedures Performed:  Ct Head Wo Contrast  12/29/2014   CLINICAL DATA:  Dizziness, nausea, and head pressure since last night, history hypertension, diabetes mellitus  EXAM: CT HEAD WITHOUT CONTRAST  TECHNIQUE: Contiguous axial images were obtained from the base of the skull through the vertex without intravenous contrast.  COMPARISON:  01/19/2009  FINDINGS: Normal ventricular morphology.  No midline shift or mass effect.  Normal appearance of brain parenchyma.  No intracranial hemorrhage, mass lesion, or evidence acute infarction.  No extra-axial fluid collections.  Sinuses clear and bones unremarkable.  IMPRESSION: No acute intracranial  abnormalities.   Electronically Signed  By: Lavonia Dana M.D.   On: 12/29/2014 13:46   Mr Brain Wo Contrast  12/29/2014   CLINICAL DATA:  Patient continues to be lightheaded and nauseated when she stands up. I suspect much of this is ongoing volume depletion from poorly controlled diabetes over the past 3-4 weeks. Much of this may also be somatic in nature secondary to grief reaction of her husband's death one month ago.  Dizziness R42 (ICD-10-CM)  EXAM: MRI HEAD WITHOUT CONTRAST  TECHNIQUE: Multiplanar, multiecho pulse sequences of the brain and surrounding structures were obtained without intravenous contrast.  COMPARISON:  CT head 12/29/2014.  FINDINGS: No evidence for acute infarction, hemorrhage, hydrocephalus, or extra-axial fluid. Mild cerebral and cerebellar atrophy.  Incidental finding noted near the vertex, is a 12 x 13 x 4 mm RIGHT parietal parasagittal extra-axial mass, without significant compression of the underlying brain, consistent with a small incidental meningioma. No ingrowth into the superior sagittal sinus. No regional vasogenic edema.  Mild chronic microvascular ischemic change. Flow voids are maintained throughout the carotid, basilar, and vertebral arteries. There are no areas of chronic hemorrhage. Pituitary and cerebellar tonsils unremarkable. Mild cervical spondylosis. Extracranial soft tissues unremarkable. Trace BILATERAL mastoid effusions.  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  No acute intracranial findings.  Mild atrophy and small vessel disease.  Mild cervical spondylosis.   Electronically Signed   By: Staci Righter M.D.   On: 12/29/2014 16:48   Admission HPI:  Mallory Arellano is a 74 yo female with PMH of PAF (on flecidine for rhythm control, not on A/C), HTN, DM, and asthma who presents from home to the ED with accompanied by her daughter with CC of nausea. She reports she was at home at 8pm last night in her normal state of health  watching TV when she stood up from a chair and started feeling unwell. She reports that she felt nauseous and dizzy. She had to hold on to a nearby table to keep her balance otherwise she would have fallen to the ground. She notes that she called her daughter who then called EMS for transport to the ED. She notes that when EMS arrived she did have one episode of emesis. She and her daughter denies any focal weakness, facial droop, slurred speech. She denied any fever, chills, dysuria. She does report a cough that has been slightly worse than her normal cough (which has been attributed to allergies in [place] since moving 2 years ago from [place]). She did see her pulmonologist yesterday and was given some hycodan as well as mucinex (from records) per patient she did received a "prednisone injection" at the doctors office although this is not noted in the care-everywhere record. She does report that her husband passed away 2 months ago after many years of declining health with heart failure and kidney failure. She does report some depressed feeling and adjustment but reports she has strong family support. She also reports eating a more liberal diet recently as friends have brought various foods but notes that she has been taking all of her medications. In the ED orthostatic vitals were checked and negative. She did appear to have subjective orthostatics and she was given 3 L of NS, a CT of her head was normal appearing and she failed a trial of Zofran, Reglan, and Meclizine. Given her failure to improve EDP asked IMTS to admit for further observation and workup.  Hospital Course by problem list: Principal Problem:   Dizziness Active  Problems:   Hypertension, essential, benign   Hypercholesteremia   Nausea   Diabetes mellitus type 2 with neurological manifestations   Paroxysmal atrial fibrillation   Mallory Arellano is a 74 y.o. yo female with a history of HTN, T2DM, hyperlipidemia, paroxismal  atrial fibrillation on aspirin and asthma who was admitted on 12/29/2014 with symptoms of lightheadedness and weakness.  Dizziness and weakness: On admission, patient was unable to walk or sit up without feeling weak and dizzy. Physical exam revealed cranial nerves II through XII intact, 5 out of 5 strength in all 4 extremities, no dysmetria to finger-nose-finger. Due to the orthostatic nature of symptoms, likely etiology was thought to be volume depletion in the context of decreased water intake and poorly controlled diabetes causing osmotic diuresis over the past 3-4 weeks. There was no orthostatic hypotension in the ED, but orthostatic vitals were obtained after 1L of fluids had already been given. Electrolytes were normal on admission with the exception of a glucose of 370. CT and MRI of the brain were done and showed no obvious evidence for acute stroke. There were trace bilateral mastoid sinus effusions, a small incidental meningioma noted near the vertex right parietal dural, as well as mild atrophy and small vessel disease.There may be a psychosomatic component secondary to grief reaction of her husband's death two month ago.  Patient was admitted to floor status for IV fluid administration, antiemetics and PT/OT evaluation. On 8/07, patient's weakness and nausea had improved but she did not feel ready to go home yet. She was able to walk with assistance with a physical therapist. On 8/08, patient was able to walk with a cane and felt ready to go home. PT recommended physical therapy 3x/week; given that patient has limited transportation, she was discharged with home health 3x/week for PT. OT evaluation was performed and patient was determined to not have OT needs at this time.   Paroxysmal atrial fibrillation: EKG showed sinus rhythm with borderline first-degree AV block. Patient did not complain of chest pain, but given that ACS can present atypically in older women, a one-time troponin level was drawn  and was negative at 0.05 with no good clinical correlation to suggest acute ischemia .Patient was placed on telemetry on admission and maintained a sinus rhythm throughout hospitalization. We continued home aspirin 81mg  daily and flecainide 150mg  BID. We spoke with patient's cardiologist Dr. Clayborn Bigness, who agreed with starting an anticoagulation agent on discharge given that patient has a CHA2DS2-VASc Score for Atrial Fibrillation Stroke Risk of 5% annually, will be 7.4% when she turns 75 next year. During this hospitalization she was agreeable to start A/C and we stopped aspirin and started apixaban 5 mg BID.   HTN: Patient's blood pressure was stable in the 140-160ss/60-70s during hospitalization, with no orthostatic hypotension. We continued home losartan and HCTZ. Furosemide was held during hospitalization while we were assessing patient's volume status and administering IV fluids, but was restarted on discharge.   Type 2 DM: While elevated blood sugar on admission could be related to the prednisone injection yesterday, patient has been under stress recently and not been checking her blood sugars regularly. It is likely that her DM has not been well controlled. Patient declined SSI and opted for diet control on a carb-modified diet while in hospital. Blood glucose levels were in the 120s-180s range with the exception of one value of 223 on the day of discharge. We held oral DM medications while inpatient, but restarted them on discharge.  Asthma: Continued home symbicort, albuterol, singulair. Patient maintained an O2 saturation >98% on room air during hospitalization.   Hyperlipidemia: Continued home atorvastatin 20 mg daily   Discharge Vitals:   BP 151/70 mmHg  Pulse 66  Temp(Src) 98.2 F (36.8 C) (Oral)  Resp 18  Ht 5\' 6"  (1.676 m)  Wt 91.491 kg (201 lb 11.2 oz)  BMI 32.57 kg/m2  SpO2 98%  Discharge Labs:  Results for orders placed or performed during the hospital encounter of 12/29/14  (from the past 24 hour(s))  Glucose, capillary     Status: Abnormal   Collection Time: 12/30/14  4:24 PM  Result Value Ref Range   Glucose-Capillary 124 (H) 65 - 99 mg/dL  Troponin I     Status: Abnormal   Collection Time: 12/30/14  5:00 PM  Result Value Ref Range   Troponin I 0.05 (H) <0.031 ng/mL  Glucose, capillary     Status: Abnormal   Collection Time: 12/30/14  8:35 PM  Result Value Ref Range   Glucose-Capillary 169 (H) 65 - 99 mg/dL  Glucose, capillary     Status: Abnormal   Collection Time: 12/31/14  7:27 AM  Result Value Ref Range   Glucose-Capillary 163 (H) 65 - 99 mg/dL  Glucose, capillary     Status: Abnormal   Collection Time: 12/31/14 11:52 AM  Result Value Ref Range   Glucose-Capillary 223 (H) 65 - 99 mg/dL    Signed: Lucious Groves, DO 12/31/2014, 9:35 PM    Services Ordered on Discharge: home health PT Equipment Ordered on Discharge: cane

## 2014-12-31 NOTE — Progress Notes (Signed)
Patient ID: Mallory Arellano, female   DOB: 13-Dec-1940, 74 y.o.   MRN: [id number] Medicine attending discharge note: I personally examined this patient this morning together with resident physician Dr. Joni Reining and acting intern Ms. Kathi Simpers and I attest to the accuracy of their evaluation and management plan.  Pleasant 74 year old woman looking much younger than her stated age. She has hypertension, type 2 diabetes, hyperlipidemia, and history of paroxysmal atrial fibrillation not on chronic anticoagulation prior to this admission. She has developed allergic rhinitis since moving to [place] from [place]. She was having a flare of her allergy symptoms. She saw her primary care physician. She was given a dose of parenteral steroids. Within 24 hours she was feeling worse not better. She developed orthostatic dizziness and ataxia when she got out of bed which persisted for a number of hours She was admitted yesterday for further evaluation of these complaints. CT and MRI of the brain were done and showed no obvious evidence for acute stroke. There were trace bilateral mastoid sinus effusions. A small incidental meningioma noted near the vertex right parietal dural. Mild atrophy and small vessel disease. Cardiogram showed sinus rhythm with borderline first-degree AV block. Currently no atrial fibrillation. Troponin was borderline elevated at 0.05 with no good clinical correlation to suggest acute ischemia. She was treated with IV hydration and observation. Her symptoms improved rapidly. She had no focal deficits on exam at time of discharge. Upper body coordination normal. No nystagmus. Home aspirin initially continued while stroke evaluation and progress. Her family physician was consulted. He had recommended that she go on anticoagulation in view of her paroxysmal atrial fibrillation but the patient had declined up until now. She did agree to start Eloquis and due to her concern re-potential  bleeding, aspirin was stopped.  Disposition: Condition stable at time of discharge. She will follow-up with her primary care physician Dr. Quentin Mulling There were no complications

## 2014-12-31 NOTE — Progress Notes (Signed)
Consult Note:  Saw patient due to lifestyle management of glucose. Patient had her husband die just recently and had not taken care of herself. She is ready and willing this morning and reports being happy to start new changes. Spoke with patient about diabetes, basic pathophysiology of DM Type 2, basic home care, importance of checking CBGs and maintaining good CBG control to prevent long-term and short-term complications.  Discussed impact of nutrition, exercise, stress, sickness, and medications on diabetes control.  Patient states that she eats a lot of veggies and drinks mostly water even though she takes a sip of juice and pepsi once in awhile to burp.  Discussed in detail carbohydrates, carbohydrate goals per day and meal, along with portion sizes. Gave patient diabetes meal planning guide, nutrition in the fast lane (eating out guide), and how to read food label. Expect good compliance with lifestyle management. Patient verbalized understanding of information discussed and she states that she has no further questions at this time related to diabetes.   MD : at time of discharge patient will need a glucose meter kit (order # [id number]).   Will set patient up at the [facility] in [place] for DM education as patient lives in [place], [place].  Thanks,  Tama Headings RN, MSN, [facility] Inpatient Diabetes Coordinator Team Pager [phone]

## 2014-12-31 NOTE — Plan of Care (Signed)
Problem: Food- and Nutrition-Related Knowledge Deficit (NB-1.1) Goal: Nutrition education Formal process to instruct or train a patient/client in a skill or to impart knowledge to help patients/clients voluntarily manage or modify food choices and eating behavior to maintain or improve health. Outcome: Completed/Met Date Met:  12/31/14  RD consulted for nutrition education regarding diabetes.   No results found for: HGBA1C  RD provided "Carbohydrate Counting for People with Diabetes" handout from the Academy of Nutrition and Dietetics. Discussed different food groups and their effects on blood sugar, emphasizing carbohydrate-containing foods. Provided list of carbohydrates and recommended serving sizes of common foods.  Discussed importance of controlled and consistent carbohydrate intake throughout the day. Recommended 4-5 servings of carbohydrates at meals, 1-2 servings at snack.  Provided examples of ways to balance meals/snacks and encouraged intake of high-fiber, whole grain complex carbohydrates. Diabetic friendly drinks options discussed. Teach back method used.  Expect good compliance.  Body mass index is 32.57 kg/(m^2). Pt meets criteria for obesity class I  based on current BMI.  Current diet order is carb modofied, patient is consuming approximately 85-100% of meals at this time. Appetite fine currently and PTA with no other difficulties. Glucerna Shake ordered PRN. RD to continue with current orders. Labs and medications reviewed. No further nutrition interventions warranted at this time. RD contact information provided. If additional nutrition issues arise, please re-consult RD.  Corrin Parker, MS, RD, LDN Pager # [phone] After hours/ weekend pager # [phone]

## 2014-12-31 NOTE — Care Management Note (Signed)
Case Management Note  Patient Details  Name: Mallory Arellano MRN: [id number] Date of Birth: January 02, 1941  Subjective/Objective:       CM following for progression and d/c planning.             Action/Plan: 12/31/2014 Met with pt re d/c needs, noted pt has been started on Eliquis, , pt given 30 day free card and number to call to learn her copay from her insurance provider after the first 30 days. Cane ordered per pt request and PT recommendation. Pt request [facility] for HHPT services, declined outpt PT due to transportation issues.   Expected Discharge Date:  12/30/14               Expected Discharge Plan:  [place]  In-House Referral:  NA  Discharge planning Services  CM Consult, Medication Assistance  Post Acute Care Choice:  Home Health, Durable Medical Equipment Choice offered to:  Patient  DME Arranged:  [facility]asandra Knudsen DME Agency:  [place] Arranged:  PT [facility] Agency:  [place]  Status of Service:  Completed, signed off  Medicare Important Message Given:    Date Medicare IM Given:    Medicare IM give by:    Date Additional Medicare IM Given:    Additional Medicare Important Message give by:     If discussed at [place] of Stay Meetings, dates discussed:    Additional Comments:  Adron Bene, RN 12/31/2014, 12:09 PM

## 2014-12-31 NOTE — Progress Notes (Signed)
Occupational Therapy Evaluation Patient Details Name: Mallory Arellano MRN: [id number] DOB: [birth year] Today's Date: 12/31/2014    History of Present Illness Mallory Arellano is a 74 yo female with PMH of PAF (on flecidine for rhythm control, not on A/C), HTN, DM, and asthma who presents from home to the ED with accompanied by her daughter with CC of nausea. She reports she was at home at 8pm last night in her normal state of health watching TV when she stood up from a chair and started feeling unwell. She reports that she felt nauseous and dizzy. She had to hold on to a nearby table to keep her balance otherwise she would have fallen to the ground.   Clinical Impression   Patient presents to OT at modified independent level for ADLs. No c/o dizziness or lightheadedness with activity in room and ambulation in hallway. No further OT needs. Will sign off.    Follow Up Recommendations  No OT follow up;Supervision - Intermittent    Equipment Recommendations  3 in 1 bedside comode    Recommendations for Other Services       Precautions / Restrictions Precautions Precautions: Fall      Mobility Bed Mobility Overal bed mobility: Independent                Transfers Overall transfer level: Modified independent Equipment used: None Transfers: Sit to/from Stand Sit to Stand: Modified independent (Device/Increase time)         General transfer comment: Cues to self-monitor for activity tolerance    Balance                                            ADL Overall ADL's : Modified independent                                       General ADL Comments: increased time neede for all tasks     Vision     Perception     Praxis      Pertinent Vitals/Pain Pain Assessment: No/denies pain     Hand Dominance Right   Extremity/Trunk Assessment Upper Extremity Assessment Upper Extremity Assessment: Overall WFL for tasks assessed   Lower  Extremity Assessment Lower Extremity Assessment: Defer to PT evaluation       Communication Communication Communication: No difficulties   Cognition Arousal/Alertness: Awake/alert Behavior During Therapy: WFL for tasks assessed/performed Overall Cognitive Status: Within Functional Limits for tasks assessed                     General Comments       Exercises       Shoulder Instructions      Home Living Family/patient expects to be discharged to:: Private residence Living Arrangements: Children Available Help at Discharge: Family;Friend(s);Available PRN/intermittently Type of Home: House Home Access: Level entry     Home Layout: One level     Bathroom Shower/Tub: Occupational psychologist: Standard Bathroom Accessibility: No   Home Equipment: None          Prior Functioning/Environment Level of Independence: Independent             OT Diagnosis: Generalized weakness   OT Problem List: Decreased activity tolerance   OT Treatment/Interventions:  OT Goals(Current goals can be found in the care plan section) Acute Rehab OT Goals Patient Stated Goal: wants to be more active; would like to have a regular exercise regimen OT Goal Formulation: All assessment and education complete, DC therapy  OT Frequency:     Barriers to D/C:            Co-evaluation              End of Session    Activity Tolerance: Patient tolerated treatment well Patient left: in bed;with call bell/phone within reach;with family/visitor present   Time: 8138-8719 OT Time Calculation (min): 24 min Charges:  OT General Charges $OT Visit: 1 Procedure OT Evaluation $Initial OT Evaluation Tier I: 1 Procedure OT Treatments $Self Care/Home Management : 8-22 mins G-Codes: OT G-codes **NOT FOR INPATIENT CLASS** Functional Limitation: Self care Self Care Current Status (L9747): At least 1 percent but less than 20 percent impaired, limited or restricted Self  Care Goal Status (V8550): At least 1 percent but less than 20 percent impaired, limited or restricted Self Care Discharge Status [phone]): At least 1 percent but less than 20 percent impaired, limited or restricted  [name] 12/31/2014, 12:34 PM

## 2014-12-31 NOTE — Progress Notes (Signed)
Pt provided with discharge instructions and prescriptions. Pt verbalized understanding of all information. IV was dc'd without complication. VSS. Pt escorted out via wheelchair by NT.

## 2015-01-08 ENCOUNTER — Ambulatory Visit: Payer: Medicare Other | Admitting: Dietician

## 2015-01-17 ENCOUNTER — Other Ambulatory Visit: Payer: Self-pay | Admitting: Family Medicine

## 2015-01-18 ENCOUNTER — Encounter: Payer: Self-pay | Admitting: Family Medicine

## 2015-01-18 ENCOUNTER — Encounter (INDEPENDENT_AMBULATORY_CARE_PROVIDER_SITE_OTHER): Payer: Self-pay

## 2015-01-18 ENCOUNTER — Ambulatory Visit (INDEPENDENT_AMBULATORY_CARE_PROVIDER_SITE_OTHER): Payer: Medicare Other | Admitting: Family Medicine

## 2015-01-18 VITALS — BP 112/68 | HR 66 | Temp 98.8°F | Ht 66.0 in | Wt 194.5 lb

## 2015-01-18 DIAGNOSIS — Z1239 Encounter for other screening for malignant neoplasm of breast: Secondary | ICD-10-CM

## 2015-01-18 DIAGNOSIS — Z0000 Encounter for general adult medical examination without abnormal findings: Secondary | ICD-10-CM

## 2015-01-18 DIAGNOSIS — R05 Cough: Secondary | ICD-10-CM | POA: Diagnosis not present

## 2015-01-18 DIAGNOSIS — Z23 Encounter for immunization: Secondary | ICD-10-CM

## 2015-01-18 DIAGNOSIS — I10 Essential (primary) hypertension: Secondary | ICD-10-CM | POA: Diagnosis not present

## 2015-01-18 DIAGNOSIS — E1140 Type 2 diabetes mellitus with diabetic neuropathy, unspecified: Secondary | ICD-10-CM | POA: Diagnosis not present

## 2015-01-18 DIAGNOSIS — R059 Cough, unspecified: Secondary | ICD-10-CM

## 2015-01-18 DIAGNOSIS — E785 Hyperlipidemia, unspecified: Secondary | ICD-10-CM

## 2015-01-18 LAB — LIPID PANEL
CHOLESTEROL: 127 mg/dL (ref 0–200)
HDL: 42.2 mg/dL (ref >39.00)
LDL CALC: 60 mg/dL (ref 0–99)
NonHDL: 84.54
Total CHOL/HDL Ratio: 3
Triglycerides: 123 mg/dL (ref 0.0–149.0)
VLDL: 24.6 mg/dL (ref 0.0–40.0)

## 2015-01-18 LAB — MICROALBUMIN / CREATININE URINE RATIO
Creatinine,U: 35.1 mg/dL
Microalb Creat Ratio: 2 mg/g (ref 0.0–30.0)
Microalb, Ur: <0.7 mg/dL (ref 0.0–1.9)

## 2015-01-18 LAB — HEMOGLOBIN A1C: HEMOGLOBIN A1C: 7.6 % — AB (ref 4.6–6.5)

## 2015-01-18 MED ORDER — PNEUMOCOCCAL 13-VAL CONJ VACC IM SUSP: 0.5000 mL | Freq: Once | INTRAMUSCULAR | Status: DC

## 2015-01-18 MED ORDER — ATORVASTATIN CALCIUM 40 MG PO TABS: 40.0000 mg | ORAL_TABLET | Freq: Every day | ORAL | Status: DC

## 2015-01-18 MED ORDER — PNEUMOCOCCAL 13-VAL CONJ VACC IM SUSP: 0.5000 mL | INTRAMUSCULAR | Status: DC

## 2015-01-18 MED ORDER — TETANUS-DIPHTH-ACELL PERTUSSIS 5-2.5-18.5 LF-MCG/0.5 IM SUSP: 0.5000 mL | Freq: Once | INTRAMUSCULAR | Status: DC

## 2015-01-18 NOTE — Assessment & Plan Note (Signed)
Obtaining lipid panel today. Increased Lipitor from 20 to 40 mg daily per AHA guidelines which recommend high potency statin therapy (Lipitor 40 mg)

## 2015-01-18 NOTE — Assessment & Plan Note (Signed)
Obtaining A1c today. Advised patient to continue current medications of glipizide and Januvia while we await A1c to determine whether she is at goal or not. Patient up-to-date with foot exam as well as eye exam.

## 2015-01-18 NOTE — Assessment & Plan Note (Signed)
Well-controlled. Continuing Hyzaar.

## 2015-01-18 NOTE — Progress Notes (Signed)
Subjective:  Patient ID: Mallory Arellano, female    DOB: 07-Dec-1940  Age: 74 y.o. MRN: [id number]  CC: Establish care  HPI Mallory Arellano is a 74 y.o. female presents to the clinic today to establish care.  Patient has several medical problems/issues as well as some concerns she would like to discuss today (see below).  1) DM-2  Unsure of control.  CBG's - Does not check.  Medications - Glipizide, Januvia.  Compliance - Yes.   Medication side effects  Hypoglycemia: no   2) HTN  Well controlled.   Medications - Hyzaar 100/12.5 mg daily  Compliance -  Yes.   3) HLD  Stable on Lipitor per patient report.  No labs available.  4) Cough   Patient reports that she has had recent pneumonia that was diagnosed and treated approximately 2 weeks ago.  She reports she continues to have intermittent cough which is very concerning for her.  She's had no recent shortness of breath.  She is compliant with her home inhalers for asthma.  No reported fevers or chills.  PMH, Surgical Hx, Family Hx, Social History reviewed and updated as below. Past Medical History  Diagnosis Date  . Palpitation   . Hypercholesteremia   . Diabetes mellitus without complication   . Hypertension   . Asthma   . UTI (urinary tract infection)   . A-fib     Past Surgical History  Procedure Laterality Date  . Wisdom tooth extraction    . Knee arthroplasty Right     Family History  Problem Relation Age of Onset  . Diabetes Mother   . Hypertension Mother   . Hypertension Brother   . Diabetes Brother     Social History  Substance Use Topics  . Smoking status: Never Smoker   . Smokeless tobacco: Never Used  . Alcohol Use: 0.0 oz/week    0 Standard drinks or equivalent per week     Comment: 1 GLASS/MONTH    Review of Systems  Constitutional: Negative for fever and chills.  HENT: Negative.   Eyes: Negative for pain.       Occasional blurry vision.  Respiratory: Positive for cough.  Negative for shortness of breath.   Cardiovascular: Negative for chest pain.  Gastrointestinal: Negative for nausea, vomiting, abdominal pain, diarrhea and constipation.  Genitourinary: Negative for dysuria, urgency and frequency.  Musculoskeletal: Negative.   Skin: Negative for rash.  Neurological: Negative for dizziness and light-headedness.   Objective:   Today's Vitals: BP 112/68 mmHg  Pulse 66  Temp(Src) 98.8 F (37.1 C) (Oral)  Ht 5\' 6"  (1.676 m)  Wt 194 lb 8 oz (88.225 kg)  BMI 31.41 kg/m2  SpO2 97%  Physical Exam  Constitutional: She is oriented to person, place, and time. She appears well-developed and well-nourished. No distress.  HENT:  Head: Normocephalic and atraumatic.  Nose: Nose normal.  Mouth/Throat: Oropharynx is clear and moist. No oropharyngeal exudate.  Normal TM's bilaterally.   Eyes: Conjunctivae are normal. No scleral icterus.  Neck: Neck supple. No thyromegaly present.  Cardiovascular: Normal rate and regular rhythm.   Pulmonary/Chest: Effort normal and breath sounds normal. She has no wheezes. She has no rales.  Abdominal: Soft. She exhibits no distension. There is no tenderness. There is no rebound and no guarding.  Musculoskeletal: Normal range of motion.  Lymphadenopathy:    She has no cervical adenopathy.  Neurological: She is alert and oriented to person, place, and time.  Skin: Skin is warm  and dry. No rash noted.  Psychiatric: She has a normal mood and affect.  Vitals reviewed.  Assessment & Plan:   Problem List Items Addressed This Visit    Cough    Obtaining chest x-ray to ensure resolution of PNA.       Relevant Orders   DG Chest 2 View   Diabetes mellitus with neuropathy - Primary    Obtaining A1c today. Advised patient to continue current medications of glipizide and Januvia while we await A1c to determine whether she is at goal or not. Patient up-to-date with foot exam as well as eye exam.      Relevant Medications    atorvastatin (LIPITOR) 40 MG tablet   Other Relevant Orders   Urine Microalbumin w/creat. ratio   Hemoglobin A1c   HLD (hyperlipidemia)    Obtaining lipid panel today. Increased Lipitor from 20 to 40 mg daily per AHA guidelines which recommend high potency statin therapy (Lipitor 40 mg)      Relevant Medications   atorvastatin (LIPITOR) 40 MG tablet   Other Relevant Orders   Lipid panel   Hypertension, essential, benign    Well-controlled. Continuing Hyzaar.      Relevant Medications   atorvastatin (LIPITOR) 40 MG tablet   Preventative health care    Ordering mammogram today.  Tdap Rx given. Labs today - A1C and Lipid panel. In of colon cancer screening. Declines colonoscopy. Will proceed with Cologuard.        Other Visit Diagnoses    Breast cancer screening        Relevant Orders    MM Digital Screening    Need for prophylactic vaccination against Streptococcus pneumoniae (pneumococcus)        Relevant Orders    Pneumococcal conjugate vaccine 13-valent (Completed)       Outpatient Encounter Prescriptions as of 01/18/2015  Medication Sig  . albuterol (PROVENTIL HFA;VENTOLIN HFA) 108 (90 BASE) MCG/ACT inhaler Inhale into the lungs every 6 (six) hours as needed for wheezing or shortness of breath.  Marland Kitchen albuterol (PROVENTIL) (2.5 MG/3ML) 0.083% nebulizer solution Take 2.5 mg by nebulization every 6 (six) hours as needed for wheezing or shortness of breath.  Marland Kitchen apixaban (ELIQUIS) 5 MG TABS tablet Take 1 tablet (5 mg total) by mouth 2 (two) times daily.  Marland Kitchen atorvastatin (LIPITOR) 20 MG tablet Take 20 mg by mouth daily.   . B Complex-Lysine-Zn-FA (BIOTALAN CLEAR) LIQD Take 1 tablet by mouth daily.   . benzonatate (TESSALON) 100 MG capsule   . CINNAMON PO Take 1 tablet by mouth daily.  . flecainide (TAMBOCOR) 150 MG tablet Take 150 mg by mouth 2 (two) times daily.   . fluticasone (FLONASE) 50 MCG/ACT nasal spray Place 1 spray into both nostrils daily.   . furosemide (LASIX) 20 MG  tablet Take 20 mg by mouth.  Marland Kitchen glipiZIDE (GLUCOTROL XL) 5 MG 24 hr tablet Take 5 mg by mouth daily with breakfast.   . HYDROcodone-homatropine (HYCODAN) 5-1.5 MG/5ML syrup TAKE 1 TEASPOONFUL BY MOUTH EVERY 6 HOURS AS NEEDED FOR COUGH  . hydrOXYzine (ATARAX/VISTARIL) 25 MG tablet as needed.  Marland Kitchen losartan-hydrochlorothiazide (HYZAAR) 100-12.5 MG per tablet Take 1 tablet by mouth daily.   . montelukast (SINGULAIR) 10 MG tablet Take 10 mg by mouth at bedtime.  . pantoprazole (PROTONIX) 40 MG tablet Take 40 mg by mouth 2 (two) times daily.   . Probiotic Product (PROBIOTIC & ACIDOPHILUS EX ST PO) Take 1 tablet by mouth daily.   . sitaGLIPtin (JANUVIA)  100 MG tablet Take 100 mg by mouth daily.  . SYMBICORT 160-4.5 MCG/ACT inhaler Inhale 2 puffs into the lungs daily.   . vitamin B-12 (CYANOCOBALAMIN) 1000 MCG tablet Take 1,000 mcg by mouth daily.    Follow-up: 3 months    Coral Spikes DO

## 2015-01-18 NOTE — Assessment & Plan Note (Addendum)
Obtaining chest x-ray to ensure resolution of PNA.

## 2015-01-18 NOTE — Progress Notes (Signed)
Pre visit review using our clinic review tool, if applicable. No additional management support is needed unless otherwise documented below in the visit note. 

## 2015-01-18 NOTE — Assessment & Plan Note (Signed)
Ordering mammogram today.  Tdap Rx given. Labs today - A1C and Lipid panel. In of colon cancer screening. Declines colonoscopy. Will proceed with Cologuard.

## 2015-01-18 NOTE — Patient Instructions (Addendum)
It was nice to see you today.  We will call you with the labs.  Please get your Tdap at your convenience. Insurance will not pay for it at our office.  Get the chest xray at the [facility] office.  We will set up your mammogram and stool test (for colon cancer screening).  Follow up in 3 months.

## 2015-01-23 ENCOUNTER — Telehealth: Payer: Self-pay

## 2015-01-23 ENCOUNTER — Other Ambulatory Visit: Payer: Self-pay | Admitting: Family Medicine

## 2015-01-23 DIAGNOSIS — F4321 Adjustment disorder with depressed mood: Secondary | ICD-10-CM

## 2015-01-23 NOTE — Telephone Encounter (Signed)
Pt was called and given her lab results and asked about a referral to talk to someone about the issues she has been having from losing her husband. We sent the referral in and told her the referral coordinators would get in touch with her

## 2015-01-30 ENCOUNTER — Other Ambulatory Visit: Payer: Self-pay | Admitting: *Deleted

## 2015-01-30 ENCOUNTER — Telehealth: Payer: Self-pay | Admitting: *Deleted

## 2015-01-30 ENCOUNTER — Other Ambulatory Visit: Payer: Self-pay | Admitting: Internal Medicine

## 2015-01-30 NOTE — Telephone Encounter (Signed)
Pt called requesting Eliquis refill.  Electronic request was sent to Dr Heber Templeton, still pending.  Pt established care with you on 8.26.16.  Please advise refill

## 2015-01-31 ENCOUNTER — Other Ambulatory Visit: Payer: Self-pay | Admitting: Internal Medicine

## 2015-01-31 ENCOUNTER — Other Ambulatory Visit: Payer: Self-pay | Admitting: Family Medicine

## 2015-01-31 MED ORDER — APIXABAN 5 MG PO TABS: 5.0000 mg | ORAL_TABLET | Freq: Two times a day (BID) | ORAL | Status: DC

## 2015-02-04 ENCOUNTER — Other Ambulatory Visit: Payer: Self-pay

## 2015-02-04 ENCOUNTER — Telehealth: Payer: Self-pay | Admitting: *Deleted

## 2015-02-04 MED ORDER — APIXABAN 5 MG PO TABS: 5.0000 mg | ORAL_TABLET | Freq: Two times a day (BID) | ORAL | Status: DC

## 2015-02-04 NOTE — Telephone Encounter (Signed)
Patient has requested a Rx refill, for Eliquis. Patient also requested lab results and speak to the physician -thanks

## 2015-02-04 NOTE — Telephone Encounter (Signed)
Pt called for lab results and needs a refill on the medication selected. Please advise.

## 2015-02-06 ENCOUNTER — Telehealth: Payer: Self-pay

## 2015-02-06 NOTE — Telephone Encounter (Signed)
She went to a doctor for x-rays and she was having right hip pain. Dr.poogie

## 2015-02-26 ENCOUNTER — Encounter: Payer: Self-pay | Admitting: *Deleted

## 2015-03-07 ENCOUNTER — Other Ambulatory Visit: Payer: Self-pay

## 2015-03-07 ENCOUNTER — Telehealth: Payer: Self-pay | Admitting: *Deleted

## 2015-03-07 MED ORDER — FUROSEMIDE 20 MG PO TABS: 20.0000 mg | ORAL_TABLET | Freq: Every day | ORAL | Status: DC

## 2015-03-07 MED ORDER — FLECAINIDE ACETATE 150 MG PO TABS: 150.0000 mg | ORAL_TABLET | Freq: Two times a day (BID) | ORAL | Status: DC

## 2015-03-07 MED ORDER — APIXABAN 5 MG PO TABS: 5.0000 mg | ORAL_TABLET | Freq: Two times a day (BID) | ORAL | Status: DC

## 2015-03-07 NOTE — Telephone Encounter (Signed)
I have completed part of her request.  I refilled the flecainide, fursosemide and eliquis for 90 supply.  She was requesting Zaralto and metoprolol ER but I can't find them in her medication list.  Please advise on them?

## 2015-03-07 NOTE — Telephone Encounter (Signed)
Patient has requested a 3 month supply for medication; flecainide, furosemide, zarto,liporide, and metoprolol Er,

## 2015-03-07 NOTE — Telephone Encounter (Signed)
Patient needs to see cardiology for future refills of flecainide. This is an antiarrythmic and should come from cardiology.  She should NOT be on both Xarelto and Eliquis. Eliquis was refilled. No Xarelto. Metoprolol was not on her meds. Don't know where this came from.

## 2015-03-18 ENCOUNTER — Telehealth: Payer: Self-pay | Admitting: *Deleted

## 2015-03-18 ENCOUNTER — Other Ambulatory Visit: Payer: Self-pay

## 2015-03-18 MED ORDER — GLIPIZIDE ER 5 MG PO TB24: 5.0000 mg | ORAL_TABLET | Freq: Every day | ORAL | Status: DC

## 2015-03-18 MED ORDER — LOSARTAN POTASSIUM-HCTZ 100-12.5 MG PO TABS: 1.0000 | ORAL_TABLET | Freq: Every day | ORAL | Status: DC

## 2015-03-18 NOTE — Telephone Encounter (Signed)
Patient has requested a medication refill for

## 2015-03-18 NOTE — Telephone Encounter (Signed)
Patient is going out of town.  Called to get three month supply of medications: Metoprolol- I explained that this was not on her med list and that maybe cardiology would refill. Lorastan-HCTZ- refilled Glipizide- Refilled  Xarelto- discussed that she was on Elquis and that you can't take both and she said she only takes the Xarelto.  Please advise?

## 2015-03-22 ENCOUNTER — Telehealth: Payer: Self-pay

## 2015-03-22 NOTE — Telephone Encounter (Signed)
Pt was being called to inform her that she needed to be seen by Dr.Cook. Reason being we can not write for her to have diabetic shoes. i left a voicemail for her to callback on Monday to talk about scheduling a appointment.

## 2015-04-22 ENCOUNTER — Ambulatory Visit (INDEPENDENT_AMBULATORY_CARE_PROVIDER_SITE_OTHER): Payer: Medicare Other | Admitting: Family Medicine

## 2015-04-22 VITALS — Ht 66.0 in

## 2015-04-22 DIAGNOSIS — E119 Type 2 diabetes mellitus without complications: Secondary | ICD-10-CM

## 2015-04-22 DIAGNOSIS — Z0289 Encounter for other administrative examinations: Secondary | ICD-10-CM

## 2015-04-22 NOTE — Progress Notes (Signed)
Pre visit review using our clinic review tool, if applicable. No additional management support is needed unless otherwise documented below in the visit note. 

## 2015-04-23 NOTE — Progress Notes (Signed)
Patient did not show.   Chart opened as patient was arrived via front desk staff in error.

## 2015-04-29 ENCOUNTER — Ambulatory Visit: Payer: Medicare Other | Admitting: Family Medicine

## 2015-04-29 DIAGNOSIS — Z0289 Encounter for other administrative examinations: Secondary | ICD-10-CM

## 2015-05-23 ENCOUNTER — Ambulatory Visit (INDEPENDENT_AMBULATORY_CARE_PROVIDER_SITE_OTHER): Payer: Medicare Other | Admitting: Family Medicine

## 2015-05-23 ENCOUNTER — Encounter: Payer: Self-pay | Admitting: Family Medicine

## 2015-05-23 VITALS — BP 124/66 | HR 71 | Temp 97.7°F | Ht 66.0 in | Wt 198.4 lb

## 2015-05-23 DIAGNOSIS — R052 Subacute cough: Secondary | ICD-10-CM | POA: Insufficient documentation

## 2015-05-23 DIAGNOSIS — E1140 Type 2 diabetes mellitus with diabetic neuropathy, unspecified: Secondary | ICD-10-CM | POA: Diagnosis not present

## 2015-05-23 DIAGNOSIS — R05 Cough: Secondary | ICD-10-CM

## 2015-05-23 MED ORDER — HYDROCOD POLST-CPM POLST ER 10-8 MG/5ML PO SUER: 5.0000 mL | Freq: Two times a day (BID) | ORAL | Status: DC | PRN

## 2015-05-23 MED ORDER — LEVALBUTEROL HCL 1.25 MG/3ML IN NEBU: 1.2500 mg | INHALATION_SOLUTION | Freq: Four times a day (QID) | RESPIRATORY_TRACT | Status: DC | PRN

## 2015-05-23 MED ORDER — ATORVASTATIN CALCIUM 40 MG PO TABS: 40.0000 mg | ORAL_TABLET | Freq: Every day | ORAL | Status: DC

## 2015-05-23 MED ORDER — FLUTICASONE PROPIONATE 50 MCG/ACT NA SUSP: 2.0000 | Freq: Every day | NASAL | Status: DC

## 2015-05-23 MED ORDER — CANAGLIFLOZIN 100 MG PO TABS: 100.0000 mg | ORAL_TABLET | Freq: Every day | ORAL | Status: DC

## 2015-05-23 MED ORDER — GLIPIZIDE ER 5 MG PO TB24: 5.0000 mg | ORAL_TABLET | Freq: Every day | ORAL | Status: DC

## 2015-05-23 NOTE — Patient Instructions (Signed)
The cough is likely post-infectious and from post nasal drip.  Take zyrtec daily as well as the flonase. I have prescribed the flonase.  Regarding your diabetes, continue the Glipizide and start the [place].  Follow up in February for Diabetes.  Take care  Dr. Lacinda Axon

## 2015-05-23 NOTE — Progress Notes (Signed)
Pre visit review using our clinic review tool, if applicable. No additional management support is needed unless otherwise documented below in the visit note. 

## 2015-05-23 NOTE — Assessment & Plan Note (Signed)
Uncontrolled and worsening. A1c is increased from 7.6 to 7.9. Patient has stopped Januvia due to side effects. She has failed metformin in the past. Will continue glipizide. Discuss addition of another medication. Patient does not want injectables her insulin at this time. Starting Invokana. Follow up in February for repeat A1c

## 2015-05-23 NOTE — Progress Notes (Signed)
Subjective:  Patient ID: Mallory Arellano, female    DOB: 28-Jun-1940  Age: 74 y.o. MRN: [id number]  CC: Cough, follow up diabetes  HPI:  74 year old female with a past medical history of hypertension, paroxysmal A. fib, hyperlipidemia, and DM 2 with neuropathy presents with complaints of cough and for follow-up regarding diabetes.  Cough  Patient is been expressing cough for the past month.  She's been treated with antibiotics twice (for bronchitis).  She continues to have productive cough.  She reports it is troublesome at night and interferes with sleep.  Patient states that she gets some relief with cough syrup that she was given by another provider.  No known exacerbating factors.  Asthma appears to be under control.  No reports of reflux.  No associated fevers or chills.  DM-2  Uncontrolled.   Most recent A1C reviewed today (from Cardiology); 7.9 (03/25/15).  She was previously on glipizide and Januvia but has stopped Januvia due to swelling.  She is also taking some sort of supplement that has a claim to cure diabetes.  She continues to not check her blood sugars.  Social Hx   Social History   Social History  . Marital Status: Married    Spouse Name: N/A  . Number of Children: N/A  . Years of Education: N/A   Social History Main Topics  . Smoking status: Never Smoker   . Smokeless tobacco: Never Used  . Alcohol Use: 0.0 oz/week    0 Standard drinks or equivalent per week     Comment: 1 GLASS/MONTH  . Drug Use: No  . Sexual Activity:    Partners: Male    Birth Control/ Protection: Post-menopausal   Other Topics Concern  . None   Social History Narrative   Review of Systems  Constitutional: Negative for fever.  Respiratory: Positive for cough.     Objective:  BP 124/66 mmHg  Pulse 71  Temp(Src) 97.7 F (36.5 C) (Oral)  Ht 5\' 6"  (1.676 m)  Wt 198 lb 6.4 oz (89.994 kg)  BMI 32.04 kg/m2  SpO2 95%  BP/Weight 05/23/2015 01/18/2015 [id number]    Systolic BP [id number]  Diastolic BP 66 68 70  Wt. (Lbs) 198.4 194.5 -  BMI 32.04 31.41 -   Physical Exam  Constitutional: She appears well-developed. No distress.  HENT:  Head: Normocephalic and atraumatic.  Mouth/Throat: Oropharynx is clear and moist.  Oropharynx with evidence of postnasal drip. No exudate. Normal TMs bilaterally.  Eyes: Conjunctivae are normal.  Cardiovascular: Normal rate and regular rhythm.   Murmur heard. Pulmonary/Chest: Effort normal and breath sounds normal. No respiratory distress. She has no wheezes. She has no rales.  Neurological: She is alert.  Psychiatric: She has a normal mood and affect.  Vitals reviewed.  Lab Results  Component Value Date   WBC 8.4 12/29/2014   HGB 13.6 12/29/2014   HCT 41.0 12/29/2014   PLT 273 12/29/2014   GLUCOSE 156* 12/30/2014   CHOL 127 01/18/2015   TRIG 123.0 01/18/2015   HDL 42.20 01/18/2015   LDLCALC 60 01/18/2015   ALT 19 12/29/2014   AST 18 12/29/2014   NA 142 12/30/2014   K 3.7 12/30/2014   CL 107 12/30/2014   CREATININE 0.85 12/30/2014   BUN 13 12/30/2014   CO2 27 12/30/2014   HGBA1C 7.6* 01/18/2015   MICROALBUR <0.7 01/18/2015    Assessment & Plan:   Problem List Items Addressed This Visit    Subacute cough  Established problem, worsening/not improved. This appears to be post infectious and/or from upper airway cough syndrome/postnasal drip. No evidence of acute asthma exacerbation. No reports of reflux. Does not appear to be cardiac in etiology. Advised continued use of Singulair. Add Zyrtec. Restart Flonase. Tussionex given for cough.      Diabetes mellitus with neuropathy ([place]) - Primary    Uncontrolled and worsening. A1c is increased from 7.6 to 7.9. Patient has stopped Januvia due to side effects. She has failed metformin in the past. Will continue glipizide. Discuss addition of another medication. Patient does not want injectables her insulin at this time. Starting Invokana. Follow  up in February for repeat A1c      Relevant Medications   glipiZIDE (GLUCOTROL XL) 5 MG 24 hr tablet   atorvastatin (LIPITOR) 40 MG tablet   canagliflozin (INVOKANA) 100 MG TABS tablet      Meds ordered this encounter  Medications  . chlorpheniramine-HYDROcodone (TUSSIONEX PENNKINETIC ER) 10-8 MG/5ML SUER    Sig: Take 5 mLs by mouth every 12 (twelve) hours as needed.    Dispense:  115 mL    Refill:  0  . fluticasone (FLONASE) 50 MCG/ACT nasal spray    Sig: Place 2 sprays into both nostrils daily.    Dispense:  16 g    Refill:  6  . glipiZIDE (GLUCOTROL XL) 5 MG 24 hr tablet    Sig: Take 1 tablet (5 mg total) by mouth daily with breakfast.    Dispense:  90 tablet    Refill:  1  . atorvastatin (LIPITOR) 40 MG tablet    Sig: Take 1 tablet (40 mg total) by mouth daily.    Dispense:  90 tablet    Refill:  3  . levalbuterol (XOPENEX) 1.25 MG/3ML nebulizer solution    Sig: Take 1.25 mg by nebulization every 6 (six) hours as needed for wheezing.    Dispense:  72 mL    Refill:  12  . canagliflozin (INVOKANA) 100 MG TABS tablet    Sig: Take 1 tablet (100 mg total) by mouth daily before breakfast.    Dispense:  30 tablet    Refill:  1    Follow-up: Return for Diabetes follow up. - February.  [place]

## 2015-05-23 NOTE — Assessment & Plan Note (Signed)
Established problem, worsening/not improved. This appears to be post infectious and/or from upper airway cough syndrome/postnasal drip. No evidence of acute asthma exacerbation. No reports of reflux. Does not appear to be cardiac in etiology. Advised continued use of Singulair. Add Zyrtec. Restart Flonase. Tussionex given for cough.

## 2015-06-18 ENCOUNTER — Encounter: Payer: Self-pay | Admitting: Family Medicine

## 2015-06-18 ENCOUNTER — Ambulatory Visit (INDEPENDENT_AMBULATORY_CARE_PROVIDER_SITE_OTHER): Payer: Medicare Other | Admitting: Family Medicine

## 2015-06-18 VITALS — BP 138/82 | HR 62 | Temp 98.3°F | Ht 66.0 in | Wt 199.0 lb

## 2015-06-18 DIAGNOSIS — M722 Plantar fascial fibromatosis: Secondary | ICD-10-CM | POA: Insufficient documentation

## 2015-06-18 DIAGNOSIS — IMO0001 Reserved for inherently not codable concepts without codable children: Secondary | ICD-10-CM

## 2015-06-18 DIAGNOSIS — E1165 Type 2 diabetes mellitus with hyperglycemia: Secondary | ICD-10-CM | POA: Diagnosis not present

## 2015-06-18 DIAGNOSIS — R05 Cough: Secondary | ICD-10-CM | POA: Diagnosis not present

## 2015-06-18 DIAGNOSIS — R053 Chronic cough: Secondary | ICD-10-CM | POA: Insufficient documentation

## 2015-06-18 MED ORDER — HYDROCOD POLST-CPM POLST ER 10-8 MG/5ML PO SUER: 5.0000 mL | Freq: Two times a day (BID) | ORAL | Status: DC | PRN

## 2015-06-18 NOTE — Patient Instructions (Signed)
I have placed a referral for your chronic cough.  I am happy to get you diabetic shoes.  Follow up as scheduled.  Take care  Dr. Lacinda Axon

## 2015-06-18 NOTE — Progress Notes (Signed)
Pre visit review using our clinic review tool, if applicable. No additional management support is needed unless otherwise documented below in the visit note. 

## 2015-06-18 NOTE — Assessment & Plan Note (Addendum)
Patient now with chronic cough; seems to be from upper airway cough syndrome/postnasal drip. Asthma appears to be well controlled. Patient on PPI. Obtaining chest x-ray today. Sending to pulmonology for further evaluation given persistence & lack of improvement.

## 2015-06-18 NOTE — Assessment & Plan Note (Signed)
Rx for diabetic shoes given today.

## 2015-06-18 NOTE — Progress Notes (Signed)
Subjective:  Patient ID: Mallory Arellano, female    DOB: 01-11-1941  Age: 75 y.o. MRN: [id number]  CC: Cough, Needs diabetic shoes  HPI:  75 year old female with a past medical history of hypertension, asthma, GERD, hyperlipidemia, DM 2, paroxysmal A. fib presents for an acute visit with the above complaints.  Cough  This is an ongoing issue the patient.  She now has a chronic cough as she's had it for months.  At our last visit I thought this was consistent with upper airway cough syndrome/postnasal drip. She was treated with cough suppressant as well as antihistamine and intranasal corticosteroid.  She has had little improvement since then. She continues to report severe cough.  She states that she feels like something is in her throat and she admits to significant postnasal drip.  No known exacerbating factors.  She denies any shortness of breath. No recent fevers or chills.  Social Hx   Social History   Social History  . Marital Status: Married    Spouse Name: N/A  . Number of Children: N/A  . Years of Education: N/A   Social History Main Topics  . Smoking status: Never Smoker   . Smokeless tobacco: Never Used  . Alcohol Use: 0.0 oz/week    0 Standard drinks or equivalent per week     Comment: 1 GLASS/MONTH  . Drug Use: No  . Sexual Activity:    Partners: Male    Birth Control/ Protection: Post-menopausal   Other Topics Concern  . None   Social History Narrative   Review of Systems  Constitutional: Negative for fever.  Respiratory: Positive for cough. Negative for shortness of breath.    Objective:  BP 138/82 mmHg  Pulse 62  Temp(Src) 98.3 F (36.8 C) (Oral)  Ht 5\' 6"  (1.676 m)  Wt 199 lb (90.266 kg)  BMI 32.13 kg/m2  SpO2 97%  BP/Weight 06/18/2015 05/23/2015 [id number]  Systolic BP 0000000 [id number]  Diastolic BP 82 66 68  Wt. (Lbs) 199 198.4 194.5  BMI 32.13 32.04 31.41   Physical Exam  Constitutional: She appears well-developed. No distress.    HENT:  Oropharynx with evidence of postnasal drip.  Cardiovascular: Normal rate and regular rhythm.   2/6 systolic murmur.   Pulmonary/Chest: Effort normal and breath sounds normal.  Neurological: She is alert.  Psychiatric: She has a normal mood and affect.  Vitals reviewed.  Diabetic Foot Check -  Appearance/palpation: No ulcers. Patient does have 2 hard areas/lesions on the left foot and 1 on the right (consistent with plantar fascial fibromatosis). Skin - no unusual pallor or redness Monofilament testing -  Right - Great toe, medial, central, lateral ball and posterior foot intact Left - Great toe, medial, central, lateral ball and posterior foot intact  Lab Results  Component Value Date   WBC 8.4 12/29/2014   HGB 13.6 12/29/2014   HCT 41.0 12/29/2014   PLT 273 12/29/2014   GLUCOSE 156* 12/30/2014   CHOL 127 01/18/2015   TRIG 123.0 01/18/2015   HDL 42.20 01/18/2015   LDLCALC 60 01/18/2015   ALT 19 12/29/2014   AST 18 12/29/2014   NA 142 12/30/2014   K 3.7 12/30/2014   CL 107 12/30/2014   CREATININE 0.85 12/30/2014   BUN 13 12/30/2014   CO2 27 12/30/2014   HGBA1C 7.6* 01/18/2015   MICROALBUR <0.7 01/18/2015    Assessment & Plan:   Problem List Items Addressed This Visit    DM (diabetes mellitus), type 2,  uncontrolled ([place])    Rx for diabetic shoes given today.      Chronic cough - Primary    Patient now with chronic cough; seems to be from upper airway cough syndrome/postnasal drip. Asthma appears to be well controlled. Patient on PPI. Obtaining chest x-ray today. Sending to pulmonology for further evaluation given persistence & lack of improvement.      Relevant Orders   Ambulatory referral to Pulmonology   DG Chest 2 View   Plantar fascial fibromatosis      Meds ordered this encounter  Medications  . chlorpheniramine-HYDROcodone (TUSSIONEX PENNKINETIC ER) 10-8 MG/5ML SUER    Sig: Take 5 mLs by mouth every 12 (twelve) hours as needed.     Dispense:  115 mL    Refill:  0    Follow-up: As scheduled.   [place]

## 2015-06-21 ENCOUNTER — Encounter: Payer: Self-pay | Admitting: Family Medicine

## 2015-07-01 ENCOUNTER — Ambulatory Visit: Payer: Medicare Other | Admitting: Family Medicine

## 2015-07-06 ENCOUNTER — Emergency Department (HOSPITAL_COMMUNITY)
Admission: EM | Admit: 2015-07-06 | Discharge: 2015-07-06 | Disposition: A | Discharge status: Home / Self Care | Payer: Medicare Other | Attending: Emergency Medicine | Admitting: Emergency Medicine

## 2015-07-06 ENCOUNTER — Emergency Department (HOSPITAL_COMMUNITY): Payer: Medicare Other

## 2015-07-06 ENCOUNTER — Encounter (HOSPITAL_COMMUNITY): Payer: Self-pay | Admitting: Emergency Medicine

## 2015-07-06 DIAGNOSIS — Z880 Allergy status to penicillin: Secondary | ICD-10-CM | POA: Insufficient documentation

## 2015-07-06 DIAGNOSIS — Z7984 Long term (current) use of oral hypoglycemic drugs: Secondary | ICD-10-CM | POA: Insufficient documentation

## 2015-07-06 DIAGNOSIS — Z79899 Other long term (current) drug therapy: Secondary | ICD-10-CM | POA: Diagnosis not present

## 2015-07-06 DIAGNOSIS — E1165 Type 2 diabetes mellitus with hyperglycemia: Secondary | ICD-10-CM | POA: Insufficient documentation

## 2015-07-06 DIAGNOSIS — J069 Acute upper respiratory infection, unspecified: Secondary | ICD-10-CM

## 2015-07-06 DIAGNOSIS — R6884 Jaw pain: Secondary | ICD-10-CM | POA: Diagnosis not present

## 2015-07-06 DIAGNOSIS — Z87440 Personal history of urinary (tract) infections: Secondary | ICD-10-CM | POA: Insufficient documentation

## 2015-07-06 DIAGNOSIS — I10 Essential (primary) hypertension: Secondary | ICD-10-CM | POA: Insufficient documentation

## 2015-07-06 DIAGNOSIS — I4891 Unspecified atrial fibrillation: Secondary | ICD-10-CM | POA: Insufficient documentation

## 2015-07-06 DIAGNOSIS — E7800 Pure hypercholesterolemia, unspecified: Secondary | ICD-10-CM | POA: Insufficient documentation

## 2015-07-06 DIAGNOSIS — Z7951 Long term (current) use of inhaled steroids: Secondary | ICD-10-CM | POA: Insufficient documentation

## 2015-07-06 DIAGNOSIS — Z7901 Long term (current) use of anticoagulants: Secondary | ICD-10-CM | POA: Diagnosis not present

## 2015-07-06 DIAGNOSIS — J45909 Unspecified asthma, uncomplicated: Secondary | ICD-10-CM | POA: Insufficient documentation

## 2015-07-06 DIAGNOSIS — H938X9 Other specified disorders of ear, unspecified ear: Secondary | ICD-10-CM | POA: Diagnosis not present

## 2015-07-06 DIAGNOSIS — R05 Cough: Secondary | ICD-10-CM | POA: Diagnosis present

## 2015-07-06 LAB — URINALYSIS, ROUTINE W REFLEX MICROSCOPIC
Bilirubin Urine: NEGATIVE
Glucose, UA: NEGATIVE mg/dL
Hgb urine dipstick: NEGATIVE
Ketones, ur: NEGATIVE mg/dL
Leukocytes, UA: NEGATIVE
NITRITE: NEGATIVE
Protein, ur: NEGATIVE mg/dL
SPECIFIC GRAVITY, URINE: 1.007 (ref 1.005–1.030)
pH: 7 (ref 5.0–8.0)

## 2015-07-06 LAB — CBC
HCT: 39.9 % (ref 36.0–46.0)
HEMOGLOBIN: 12.9 g/dL (ref 12.0–15.0)
MCH: 27.3 pg (ref 26.0–34.0)
MCHC: 32.3 g/dL (ref 30.0–36.0)
MCV: 84.5 fL (ref 78.0–100.0)
Platelets: 253 10*3/uL (ref 150–400)
RBC: 4.72 MIL/uL (ref 3.87–5.11)
RDW: 14.6 % (ref 11.5–15.5)
WBC: 10.2 10*3/uL (ref 4.0–10.5)

## 2015-07-06 LAB — BASIC METABOLIC PANEL
ANION GAP: 10 (ref 5–15)
BUN: 14 mg/dL (ref 6–20)
CALCIUM: 9.3 mg/dL (ref 8.9–10.3)
CO2: 28 mmol/L (ref 22–32)
Chloride: 99 mmol/L — ABNORMAL LOW (ref 101–111)
Creatinine, Ser: 0.97 mg/dL (ref 0.44–1.00)
GFR calc Af Amer: >60 mL/min (ref >60)
GFR, EST NON AFRICAN AMERICAN: 56 mL/min — AB (ref >60)
Glucose, Bld: 228 mg/dL — ABNORMAL HIGH (ref 65–99)
Potassium: 4 mmol/L (ref 3.5–5.1)
Sodium: 137 mmol/L (ref 135–145)

## 2015-07-06 LAB — CBG MONITORING, ED
GLUCOSE-CAPILLARY: 210 mg/dL — AB (ref 65–99)
Glucose-Capillary: 160 mg/dL — ABNORMAL HIGH (ref 65–99)

## 2015-07-06 MED ORDER — SODIUM CHLORIDE 0.9 % IV BOLUS (SEPSIS): 500.0000 mL | Freq: Once | INTRAVENOUS | Status: DC

## 2015-07-06 NOTE — Discharge Instructions (Signed)
Please read and follow all provided instructions.  Your diagnoses today include:  1. URI (upper respiratory infection)    Tests performed today include:  Vital signs. See below for your results today.   Medications prescribed:   None   Home care instructions:  Follow any educational materials contained in this packet.  Follow-up instructions: Please follow-up with your primary care provider in the next 48 hours for further evaluation of symptoms and treatment   Return instructions:   Please return to the Emergency Department if you do not get better, if you get worse, or new symptoms OR  - Fever (temperature greater than 101.68F)  - Bleeding that does not stop with holding pressure to the area    -Severe pain (please note that you may be more sore the day after your accident)  - Chest Pain  - Difficulty breathing  - Severe nausea or vomiting  - Inability to tolerate food and liquids  - Passing out  - Skin becoming red around your wounds  - Change in mental status (confusion or lethargy)  - New numbness or weakness     Please return if you have any other emergent concerns.  Additional Information:  Your vital signs today were: BP 138/70 mmHg   Pulse 55   Temp(Src) 99.1 F (37.3 C) (Oral)   Resp 18   Ht 5' 5.5" (1.664 m)   Wt 88.451 kg   BMI 31.94 kg/m2   SpO2 97% If your blood pressure (BP) was elevated above 135/85 this visit, please have this repeated by your doctor within one month. ---------------

## 2015-07-06 NOTE — ED Notes (Signed)
Family member to nurse first to have pt triaged again. Marland KitchenMarland Kitchen

## 2015-07-06 NOTE — ED Provider Notes (Signed)
CSN: [id number]     Arrival date & time 07/06/15  1547 History   First MD Initiated Contact with Patient 07/06/15 1744     Chief Complaint  Patient presents with  . Hyperglycemia  . URI   (Consider location/radiation/quality/duration/timing/severity/associated sxs/prior Treatment) HPI 75 y.o. female with a hx of DM, HLD, HTN, Asthma, AFib on anticoagulation, presents to the Emergency Department today complaining of hyperglycemia as well as upper respiratory symptoms. States that she has had sore throat, cough with productive sputum, sinus congestion, ear congestion x 1 month. Notes recent hyperglycemic episode with blood sugar around 287 this morning. Has seen PCP (Dr. Lacinda Axon) for symptoms with treatments such as: antihistamines, decongestants, and steroids. Has referral to pulmonology for further evaluation of chronic cough. No fevers. No N/V/D. No CP/SOB/ABD pain. No headaches. No numbness/tingling. No other symptoms noted.   Past Medical History  Diagnosis Date  . Palpitation   . Hypercholesteremia   . Diabetes mellitus without complication ([place])   . Hypertension   . Asthma   . UTI (urinary tract infection)   . A-fib [facility])    Past Surgical History  Procedure Laterality Date  . Wisdom tooth extraction    . Knee arthroplasty Right    Family History  Problem Relation Age of Onset  . Diabetes Mother   . Hypertension Mother   . Hypertension Brother   . Diabetes Brother    Social History  Substance Use Topics  . Smoking status: Never Smoker   . Smokeless tobacco: Never Used  . Alcohol Use: 0.0 oz/week    0 Standard drinks or equivalent per week     Comment: 1 GLASS/MONTH   OB History    Gravida Para Term Preterm AB TAB SAB Ectopic Multiple Living   3 3        3      Review of Systems ROS reviewed and all are negative for acute change except as noted in the HPI.  Allergies  Warfarin sodium and Penicillins  Home Medications   Prior to Admission medications   Medication  Sig Start Date End Date Taking? Authorizing Provider  albuterol (PROVENTIL HFA;VENTOLIN HFA) 108 (90 BASE) MCG/ACT inhaler Inhale into the lungs every 6 (six) hours as needed for wheezing or shortness of breath.    Historical Provider, MD  albuterol (PROVENTIL) (2.5 MG/3ML) 0.083% nebulizer solution Take 2.5 mg by nebulization every 6 (six) hours as needed for wheezing or shortness of breath.    Historical Provider, MD  apixaban (ELIQUIS) 5 MG TABS tablet Take 1 tablet (5 mg total) by mouth 2 (two) times daily. 03/07/15   Coral Spikes, DO  atorvastatin (LIPITOR) 40 MG tablet Take 1 tablet (40 mg total) by mouth daily. 05/23/15   Coral Spikes, DO  B Complex-Lysine-Zn-FA (BIOTALAN CLEAR) LIQD Take 1 tablet by mouth daily.     Historical Provider, MD  chlorpheniramine-HYDROcodone (TUSSIONEX PENNKINETIC ER) 10-8 MG/5ML SUER Take 5 mLs by mouth every 12 (twelve) hours as needed. 06/18/15   Jayce G Cook, DO  CINNAMON PO Take 1 tablet by mouth daily.    Historical Provider, MD  flecainide (TAMBOCOR) 150 MG tablet Take 1 tablet (150 mg total) by mouth 2 (two) times daily. 03/07/15   Jayce G Cook, DO  fluticasone (FLONASE) 50 MCG/ACT nasal spray Place 2 sprays into both nostrils daily. 05/23/15   Coral Spikes, DO  furosemide (LASIX) 20 MG tablet Take 1 tablet (20 mg total) by mouth daily. 03/07/15   Jayce  G Cook, DO  glipiZIDE (GLUCOTROL XL) 5 MG 24 hr tablet Take 1 tablet (5 mg total) by mouth daily with breakfast. 05/23/15   Coral Spikes, DO  levalbuterol (XOPENEX) 1.25 MG/3ML nebulizer solution Take 1.25 mg by nebulization every 6 (six) hours as needed for wheezing. 05/23/15   Coral Spikes, DO  losartan-hydrochlorothiazide (HYZAAR) 100-12.5 MG tablet Take 1 tablet by mouth daily. 03/18/15   Jayce G Cook, DO  montelukast (SINGULAIR) 10 MG tablet Take 10 mg by mouth at bedtime.    Historical Provider, MD  pantoprazole (PROTONIX) 40 MG tablet Take 40 mg by mouth 2 (two) times daily.  09/05/14   Historical  Provider, MD  Probiotic Product (PROBIOTIC & ACIDOPHILUS EX ST PO) Take 1 tablet by mouth daily.     Historical Provider, MD  SYMBICORT 160-4.5 MCG/ACT inhaler Inhale 2 puffs into the lungs daily.  09/07/14   Historical Provider, MD  vitamin B-12 (CYANOCOBALAMIN) 1000 MCG tablet Take 1,000 mcg by mouth daily.    Historical Provider, MD   BP 138/70 mmHg  Pulse 55  Temp(Src) 98.4 F (36.9 C) (Rectal)  Resp 18  Ht 5' 5.5" (1.664 m)  Wt 88.451 kg  BMI 31.94 kg/m2  SpO2 97%   Physical Exam  Constitutional: She is oriented to person, place, and time. She appears well-developed and well-nourished.  HENT:  Head: Normocephalic and atraumatic.  Right Ear: Tympanic membrane, external ear and ear canal normal.  Left Ear: Tympanic membrane, external ear and ear canal normal.  Nose: Right sinus exhibits maxillary sinus tenderness. Left sinus exhibits maxillary sinus tenderness.  Mouth/Throat: Uvula is midline and mucous membranes are normal. Posterior oropharyngeal erythema present.  Eyes: EOM are normal.  Neck: Normal range of motion. Neck supple.  Cardiovascular: Normal rate, regular rhythm and normal heart sounds.   Pulmonary/Chest: Effort normal and breath sounds normal.  Abdominal: Soft. Bowel sounds are normal. There is no tenderness.  Musculoskeletal: Normal range of motion.  Neurological: She is alert and oriented to person, place, and time.  Skin: Skin is warm and dry.  Psychiatric: She has a normal mood and affect. Her behavior is normal. Thought content normal.  Nursing note and vitals reviewed.  ED Course  Procedures (including critical care time) Labs Review Labs Reviewed  BASIC METABOLIC PANEL - Abnormal; Notable for the following:    Chloride 99 (*)    Glucose, Bld 228 (*)    GFR calc non Af Amer 56 (*)    All other components within normal limits  CBG MONITORING, ED - Abnormal; Notable for the following:    Glucose-Capillary 210 (*)    All other components within normal  limits  CBC  URINALYSIS, ROUTINE W REFLEX MICROSCOPIC (NOT AT [facility])   Imaging Review Dg Chest 2 View  07/06/2015  CLINICAL DATA:  75 year old diabetic presenting with hyperglycemia and acute onset of dizziness earlier today. Intermittent cough, chest congestion and sore throat over the past month. EXAM: CHEST  2 VIEW COMPARISON:  05/08/2013, 07/13/2005. FINDINGS: Cardiac silhouette moderately enlarged, unchanged. Hilar and mediastinal contours otherwise unremarkable. Lungs clear. Bronchovascular markings normal. Pulmonary vascularity normal. No visible pleural effusions. No pneumothorax. Visualized bony thorax intact. IMPRESSION: Stable cardiomegaly.  No acute cardiopulmonary disease. Electronically Signed   By: Evangeline Dakin M.D.   On: 07/06/2015 19:11   I have personally reviewed and evaluated these images and lab results as part of my medical decision-making.   EKG Interpretation None      MDM  I have reviewed relevant laboratory values. I have reviewed relevant imaging studies. I have reviewed the relevant previous healthcare records. I obtained HPI from historian. Patient discussed with supervising physician  ED Course:  Assessment: 29y F hx of DM, HLD, HTN, Asthma, AFib on anticoagulation presents with continued cough, URI symptoms x 1 month. Noted hyperglycemia this morning at 287. Has been treated by PCP with antihistamines, decongestants, and steroids. On exam, pt is in NAD. VSS. Afebrile. Lungs CTA. Heart RRR. Labs unremarkable. No leukocytosis. Showed elevated glucose at 228. Given Ns bolus in ED. UA unremarkable. CXR showed no acute cardiopulmonary disease. Patient has follow up with Pulmonologist as per PCP. Will DC patient at this time with scheduled follow up.         Disposition/Plan:  DC Home Additional Verbal discharge instructions given and discussed with patient.  Pt Instructed to f/u with Pulmonologist Return precautions given Pt acknowledges and agrees with  plan  Supervising Physician Davonna Belling, MD   Final diagnoses:  URI (upper respiratory infection)       Shary Decamp, PA-C 07/06/15 [place], MD 07/07/15 1452

## 2015-07-06 NOTE — ED Notes (Addendum)
Pt reports elevated Blood sugar onset yesterday. Pt went to an urgent care yesterday but was too late to get results. Pt reports that her blood sugar was 287 this morning. Pt also reports that she has post nasal drip and would like her sinuses checked.

## 2015-07-19 ENCOUNTER — Telehealth: Payer: Self-pay | Admitting: Family Medicine

## 2015-07-19 MED ORDER — GLIPIZIDE ER 5 MG PO TB24: 5.0000 mg | ORAL_TABLET | Freq: Every day | ORAL | Status: DC

## 2015-07-19 NOTE — Telephone Encounter (Signed)
Pt called needing a refill for glipiZIDE (GLUCOTROL XL) 5 MG 24 hr tablet with 90 day supply. Pt has not had her medication for 2 days. Pharmacy is [id number] [address], [place]. Call pt @ [phone]. Thank you!

## 2015-07-19 NOTE — Telephone Encounter (Signed)
Refilled per request. Thanks 

## 2015-09-09 ENCOUNTER — Other Ambulatory Visit: Payer: Self-pay | Admitting: Family Medicine

## 2015-09-09 NOTE — Telephone Encounter (Signed)
PA from [facility] out of [place] wanted to give patient a knee injection but wanted to verify if Dr.Cook was okay with her receiving injection due to her being diabetic. Dr.Cook was consulted and gave the okay for injection.

## 2015-09-17 ENCOUNTER — Other Ambulatory Visit: Payer: Self-pay | Admitting: Family Medicine

## 2015-09-17 NOTE — Telephone Encounter (Signed)
Refilled 02/2015. Please advise?

## 2015-09-24 ENCOUNTER — Other Ambulatory Visit: Payer: Self-pay | Admitting: *Deleted

## 2015-09-24 MED ORDER — LOSARTAN POTASSIUM-HCTZ 100-12.5 MG PO TABS: 1.0000 | ORAL_TABLET | Freq: Every day | ORAL | Status: DC

## 2015-09-24 MED ORDER — MONTELUKAST SODIUM 10 MG PO TABS: 10.0000 mg | ORAL_TABLET | Freq: Every day | ORAL | Status: DC

## 2015-09-24 NOTE — Addendum Note (Signed)
Addended by: Bevelyn Ngo on: 09/24/2015 04:42 PM   Modules accepted: Orders

## 2015-09-24 NOTE — Telephone Encounter (Signed)
Please call pt @ [phone]. Thank you!

## 2015-09-24 NOTE — Telephone Encounter (Signed)
Please advise for Singular and flecinide.  I saw a prior note that cardiology is supposed to order it? Thanks

## 2015-09-24 NOTE — Telephone Encounter (Signed)
Pt also needs refill  Celecox 200mg , montelukast (SINGULAIR) 10 MG tablet and flecainide (TAMBOCOR) 150 MG tablet. Thank you! Pt has been out of BP medication for 3 days. Call pt @ [phone]. Thank you!

## 2015-09-24 NOTE — Telephone Encounter (Signed)
Patient requested a medication refill for losartan  she's currently  In [place].  [facility]  [place]  Fax [phone]

## 2015-11-19 ENCOUNTER — Ambulatory Visit: Payer: Medicare Other | Admitting: Family Medicine

## 2015-11-19 DIAGNOSIS — Z0289 Encounter for other administrative examinations: Secondary | ICD-10-CM

## 2016-01-10 ENCOUNTER — Emergency Department: Payer: Medicare Other

## 2016-01-10 ENCOUNTER — Encounter: Payer: Self-pay | Admitting: Emergency Medicine

## 2016-01-10 ENCOUNTER — Emergency Department
Admission: EM | Admit: 2016-01-10 | Discharge: 2016-01-10 | Disposition: A | Discharge status: Home / Self Care | Payer: Medicare Other | Attending: Student in an Organized Health Care Education/Training Program | Admitting: Student in an Organized Health Care Education/Training Program

## 2016-01-10 DIAGNOSIS — Y939 Activity, unspecified: Secondary | ICD-10-CM | POA: Diagnosis not present

## 2016-01-10 DIAGNOSIS — M25532 Pain in left wrist: Secondary | ICD-10-CM | POA: Insufficient documentation

## 2016-01-10 DIAGNOSIS — Y92481 Parking lot as the place of occurrence of the external cause: Secondary | ICD-10-CM | POA: Diagnosis not present

## 2016-01-10 DIAGNOSIS — M25531 Pain in right wrist: Secondary | ICD-10-CM

## 2016-01-10 DIAGNOSIS — E119 Type 2 diabetes mellitus without complications: Secondary | ICD-10-CM | POA: Insufficient documentation

## 2016-01-10 DIAGNOSIS — J45909 Unspecified asthma, uncomplicated: Secondary | ICD-10-CM | POA: Insufficient documentation

## 2016-01-10 DIAGNOSIS — M25562 Pain in left knee: Secondary | ICD-10-CM | POA: Insufficient documentation

## 2016-01-10 DIAGNOSIS — Z7984 Long term (current) use of oral hypoglycemic drugs: Secondary | ICD-10-CM | POA: Diagnosis not present

## 2016-01-10 DIAGNOSIS — R220 Localized swelling, mass and lump, head: Secondary | ICD-10-CM | POA: Insufficient documentation

## 2016-01-10 DIAGNOSIS — I10 Essential (primary) hypertension: Secondary | ICD-10-CM | POA: Diagnosis not present

## 2016-01-10 DIAGNOSIS — W010XXA Fall on same level from slipping, tripping and stumbling without subsequent striking against object, initial encounter: Secondary | ICD-10-CM | POA: Diagnosis not present

## 2016-01-10 DIAGNOSIS — Y999 Unspecified external cause status: Secondary | ICD-10-CM | POA: Insufficient documentation

## 2016-01-10 DIAGNOSIS — W19XXXA Unspecified fall, initial encounter: Secondary | ICD-10-CM

## 2016-01-10 DIAGNOSIS — Z79899 Other long term (current) drug therapy: Secondary | ICD-10-CM | POA: Insufficient documentation

## 2016-01-10 MED ORDER — ACETAMINOPHEN 500 MG PO TABS
1000.0000 mg | ORAL_TABLET | Freq: Once | ORAL | Status: AC
Start: 2016-01-10 — End: 2016-01-10
  Administered 2016-01-10: 1000 mg via ORAL
  Filled 2016-01-10: qty 2

## 2016-01-10 NOTE — ED Notes (Signed)
Patient transported to X-ray 

## 2016-01-10 NOTE — ED Provider Notes (Signed)
[facility] Emergency Department Provider Note    First MD Initiated Contact with Patient 01/10/16 2041     (approximate)  I have reviewed the triage vital signs and the nursing notes.   HISTORY  Chief Complaint Fall    HPI Mallory Arellano is a 75 y.o. female  with history of A. fib on [place] relative presents with mechanical fall after tripping over a parking lot curb. Patient states that she fell onto her left knee as well as bilateral outstretched hands and landed on her right face. No loss of consciousness. She has not ambulated since the fall. Family was at bedside and states she was otherwise acting normal. EMS was called to bring patient to the ER for further evaluation. On arrival she is complaining of bilateral forearm pain isolated to the wrist of both forearms as well as the left knee. Denies any chest pain, shortness of breath or abdominal pain.   Past Medical History:  Diagnosis Date  . A-fib ([place])   . Asthma   . Diabetes mellitus without complication ([place])   . Hypercholesteremia   . Hypertension   . Palpitation   . UTI (urinary tract infection)     Patient Active Problem List   Diagnosis Date Noted  . Chronic cough 06/18/2015  . Plantar fascial fibromatosis 06/18/2015  . Preventative health care 01/18/2015  . Paroxysmal atrial fibrillation ([place]) 12/30/2014  . Hypertension, essential, benign 10/16/2014  . Mild persistent chronic asthma without complication 0000000  . Allergic rhinitis 10/16/2014  . GERD (gastroesophageal reflux disease) 10/16/2014  . HLD (hyperlipidemia)   . Arthritis of knee, degenerative 09/07/2014  . DM (diabetes mellitus), type 2, uncontrolled ([place]) 10/26/2012    Past Surgical History:  Procedure Laterality Date  . KNEE ARTHROPLASTY Right   . WISDOM TOOTH EXTRACTION      Prior to Admission medications   Medication Sig Start Date End Date Taking? Authorizing Provider  albuterol (PROVENTIL HFA;VENTOLIN  HFA) 108 (90 BASE) MCG/ACT inhaler Inhale into the lungs every 6 (six) hours as needed for wheezing or shortness of breath.    Historical Provider, MD  albuterol (PROVENTIL) (2.5 MG/3ML) 0.083% nebulizer solution Take 2.5 mg by nebulization every 6 (six) hours as needed for wheezing or shortness of breath.    Historical Provider, MD  apixaban (ELIQUIS) 5 MG TABS tablet Take 1 tablet (5 mg total) by mouth 2 (two) times daily. 03/07/15   Coral Spikes, DO  atorvastatin (LIPITOR) 40 MG tablet Take 1 tablet (40 mg total) by mouth daily. 05/23/15   Coral Spikes, DO  B Complex-Lysine-Zn-FA (BIOTALAN CLEAR) LIQD Take 1 tablet by mouth daily.     Historical Provider, MD  chlorpheniramine-HYDROcodone (TUSSIONEX PENNKINETIC ER) 10-8 MG/5ML SUER Take 5 mLs by mouth every 12 (twelve) hours as needed. 06/18/15   Jayce G Cook, DO  CINNAMON PO Take 1 tablet by mouth daily.    Historical Provider, MD  flecainide (TAMBOCOR) 150 MG tablet Take 1 tablet (150 mg total) by mouth 2 (two) times daily. 03/07/15   Jayce G Cook, DO  fluticasone (FLONASE) 50 MCG/ACT nasal spray Place 2 sprays into both nostrils daily. 05/23/15   Coral Spikes, DO  furosemide (LASIX) 20 MG tablet Take 1 tablet (20 mg total) by mouth daily. 03/07/15   Coral Spikes, DO  glipiZIDE (GLUCOTROL XL) 5 MG 24 hr tablet Take 1 tablet (5 mg total) by mouth daily with breakfast. 07/19/15   Coral Spikes, DO  levalbuterol (XOPENEX) 1.25 MG/3ML nebulizer solution Take 1.25 mg by nebulization every 6 (six) hours as needed for wheezing. 05/23/15   Coral Spikes, DO  losartan-hydrochlorothiazide (HYZAAR) 100-12.5 MG tablet Take 1 tablet by mouth daily. 09/24/15   Jayce G Cook, DO  montelukast (SINGULAIR) 10 MG tablet Take 1 tablet (10 mg total) by mouth daily. 09/24/15   Coral Spikes, DO  pantoprazole (PROTONIX) 40 MG tablet Take 40 mg by mouth 2 (two) times daily.  09/05/14   Historical Provider, MD  Probiotic Product (PROBIOTIC & ACIDOPHILUS EX ST PO) Take 1 tablet by  mouth daily.     Historical Provider, MD  SYMBICORT 160-4.5 MCG/ACT inhaler Inhale 2 puffs into the lungs daily.  09/07/14   Historical Provider, MD  vitamin B-12 (CYANOCOBALAMIN) 1000 MCG tablet Take 1,000 mcg by mouth daily.    Historical Provider, MD    Allergies Warfarin sodium and Penicillins  Family History  Problem Relation Age of Onset  . Diabetes Mother   . Hypertension Mother   . Hypertension Brother   . Diabetes Brother     Social History Social History  Substance Use Topics  . Smoking status: Never Smoker  . Smokeless tobacco: Never Used  . Alcohol use 0.0 oz/week     Comment: 1 GLASS/MONTH    Review of Systems Patient denies headaches, rhinorrhea, blurry vision, numbness, shortness of breath, chest pain, edema, cough, abdominal pain, nausea, vomiting, diarrhea, dysuria, fevers, rashes or hallucinations unless otherwise stated above in HPI. ____________________________________________   PHYSICAL EXAM:  VITAL SIGNS: Vitals:   01/10/16 2044  BP: (!) 157/63  Pulse: 71  Resp: 18  Temp: 98.3 F (36.8 C)    Constitutional: Alert and oriented. Well appearing and in no acute distress. Eyes: Conjunctivae are normal. PERRL. EOMI. Head: small amount of ecchymosis and swelling to right forehead Nose: No congestion/rhinnorhea. Mouth/Throat: Mucous membranes are moist.  Oropharynx non-erythematous. Neck: No stridor. Painless ROM. No cervical spine tenderness to palpation Hematological/Lymphatic/Immunilogical: No cervical lymphadenopathy. Cardiovascular: Normal rate, regular rhythm. Grossly normal heart sounds.  Good peripheral circulation. Respiratory: Normal respiratory effort.  No retractions. Lungs CTAB. Gastrointestinal: Soft and nontender. No distention. No abdominal bruits. No CVA tenderness. Genitourinary:  Musculoskeletal: ttp of RUE and LUE along dorsal aspect of distal radius, no snuff box tenderness to either.  No elbow or shoulder pain to either Ue.  SILT  distally, brisk cap refill.  TTP of left patella.  No effusion, SILT distally, 2+ DP and PT pulses. Neurologic:  Normal speech and language. No gross focal neurologic deficits are appreciated. No gait instability. Skin:  Skin is warm, dry and intact. No rash noted. Psychiatric: Mood and affect are normal. Speech and behavior are normal.  ____________________________________________   LABS (all labs ordered are listed, but only abnormal results are displayed)  No results found for this or any previous visit (from the past 24 hour(s)). ____________________________________________ ____________________________________________  RADIOLOGY  CT head with naica XR wrist left without fracture XR wrist right without fracture XR knee left without fracture ____________________________________________   PROCEDURES  Procedure(s) performed: none    Critical Care performed: no ____________________________________________   INITIAL IMPRESSION / ASSESSMENT AND PLAN / ED COURSE  Pertinent labs & imaging results that were available during my care of the patient were reviewed by me and considered in my medical decision making (see chart for details).  DDX: fracture, dislocation, contusion, sdh, sah, iph  Mallory Arellano is a 75 y.o. who presents to the ED  with chief complaint of head injury as well as bilateral wrist pain and knee pain status post mechanical fall occurred for prior to the presentation. Patient hemodynamically stable. Full trauma evaluation shows no evidence of external trauma to the chest abdomen or pelvis. Patient otherwise well appearing. She is uncertain relative and did have trauma to the head will order CT head to evaluate for subarachnoid hemorrhage versus subdural hematoma. Will order x-ray imaging of bilateral wrists and need to evaluate for fracture dislocation. Patient denied any syncopal components, shortness of breath or chest pain. Based on her presentation do not feel  further diagnostic testing clinically indicated at this time.  Clinical Course  Comment By Time  Patient reassessed. Patient denies any complaints at this time. X-ray imaging without evidence of fracture. No evidence of retained foreign body. CT head is without acute intracranial abnormality or fracture.  Patient was able to tolerate PO and was able to ambulate with a steady gait. Have discussed with the patient and available family all diagnostics and treatments performed thus far and all questions were answered to the best of my ability. The patient demonstrates understanding and agreement with plan.  Merlyn Lot, MD 08/18 2146     ____________________________________________   FINAL CLINICAL IMPRESSION(S) / ED DIAGNOSES  Final diagnoses:  Fall from standing, initial encounter  Knee pain, acute, left  Wrist pain, acute, left  Wrist pain, acute, right      NEW MEDICATIONS STARTED DURING THIS VISIT:  New Prescriptions   No medications on file     Note:  This document was prepared using Dragon voice recognition software and may include unintentional dictation errors.    Merlyn Lot, MD 01/10/16 2204

## 2016-01-10 NOTE — ED Notes (Signed)
Patient returned from CT/XR.

## 2016-01-10 NOTE — ED Triage Notes (Signed)
Patient [place] ems from church. Patient tripped and fell in the parking lot. Patient hit the right side of her face, denies LOC. Patient with complaint of bilateral wrist pain and left knee pain.

## 2016-02-12 ENCOUNTER — Telehealth: Payer: Self-pay | Admitting: *Deleted

## 2016-02-12 NOTE — Telephone Encounter (Signed)
Dr. Lacinda Axon stated that she can be seen at first available.

## 2016-02-12 NOTE — Telephone Encounter (Signed)
Scheduled

## 2016-02-12 NOTE — Telephone Encounter (Signed)
Patient has to be rescheduled from 09/21, she requested to see Dr. Lacinda Axon on Friday Pt contact [phone]

## 2016-02-13 ENCOUNTER — Ambulatory Visit (INDEPENDENT_AMBULATORY_CARE_PROVIDER_SITE_OTHER): Payer: Medicare Other | Admitting: Family

## 2016-02-13 ENCOUNTER — Encounter: Payer: Self-pay | Admitting: Family

## 2016-02-13 ENCOUNTER — Ambulatory Visit: Payer: Medicare Other | Admitting: Family Medicine

## 2016-02-13 VITALS — BP 108/60 | HR 63 | Temp 98.1°F | Ht 66.0 in | Wt 193.8 lb

## 2016-02-13 DIAGNOSIS — J0120 Acute ethmoidal sinusitis, unspecified: Secondary | ICD-10-CM

## 2016-02-13 MED ORDER — DOXYCYCLINE HYCLATE 100 MG PO TABS: 100.0000 mg | ORAL_TABLET | Freq: Two times a day (BID) | ORAL | 0 refills | Status: DC

## 2016-02-13 NOTE — Progress Notes (Signed)
Subjective:    Patient ID: Mallory Arellano, female    DOB: Feb 28, 1941, 75 y.o.   MRN: [id number]  CC: Mallory Arellano is a 75 y.o. female who presents today for an acute visit.    HPI: Patient here for evaluation of sinus congestion x 4 months, unchanged. Endorses cough and post nasal drip. Dry cough worse when lying down. No ear pain.   Has GERD however doesn't have typical epigastric burning. 'Gets sour stomach' if doesn't take omeprazole ( had been on protonix 09/3015). No food triggers. No trouble swallowing, nights sweats, unintentional weight loss.   Pending appt with allergist next week.   H/o asthma , saw Dr Raul Del pulmonologist yesterday as well as 09/3015, and will pick up Flonase today.   Has moved to [place] from [place] 2.5 years ago and seasonal allergies have worsened since.     HISTORY:  Past Medical History:  Diagnosis Date  . A-fib ([place])   . Asthma   . Diabetes mellitus without complication ([place])   . Hypercholesteremia   . Hypertension   . Palpitation   . UTI (urinary tract infection)    Past Surgical History:  Procedure Laterality Date  . KNEE ARTHROPLASTY Right   . WISDOM TOOTH EXTRACTION     Family History  Problem Relation Age of Onset  . Diabetes Mother   . Hypertension Mother   . Hypertension Brother   . Diabetes Brother     Allergies: Warfarin sodium and Penicillins Current Outpatient Prescriptions on File Prior to Visit  Medication Sig Dispense Refill  . albuterol (PROVENTIL HFA;VENTOLIN HFA) 108 (90 BASE) MCG/ACT inhaler Inhale into the lungs every 6 (six) hours as needed for wheezing or shortness of breath.    Marland Kitchen albuterol (PROVENTIL) (2.5 MG/3ML) 0.083% nebulizer solution Take 2.5 mg by nebulization every 6 (six) hours as needed for wheezing or shortness of breath.    Marland Kitchen apixaban (ELIQUIS) 5 MG TABS tablet Take 1 tablet (5 mg total) by mouth 2 (two) times daily. 180 tablet 3  . atorvastatin (LIPITOR) 40 MG tablet Take 1 tablet (40 mg total) by mouth daily.  90 tablet 3  . B Complex-Lysine-Zn-FA (BIOTALAN CLEAR) LIQD Take 1 tablet by mouth daily.     . chlorpheniramine-HYDROcodone (TUSSIONEX PENNKINETIC ER) 10-8 MG/5ML SUER Take 5 mLs by mouth every 12 (twelve) hours as needed. 115 mL 0  . CINNAMON PO Take 1 tablet by mouth daily.    . flecainide (TAMBOCOR) 150 MG tablet Take 1 tablet (150 mg total) by mouth 2 (two) times daily. 90 tablet 3  . fluticasone (FLONASE) 50 MCG/ACT nasal spray Place 2 sprays into both nostrils daily. 16 g 6  . furosemide (LASIX) 20 MG tablet Take 1 tablet (20 mg total) by mouth daily. 90 tablet 3  . glipiZIDE (GLUCOTROL XL) 5 MG 24 hr tablet Take 1 tablet (5 mg total) by mouth daily with breakfast. 90 tablet 1  . levalbuterol (XOPENEX) 1.25 MG/3ML nebulizer solution Take 1.25 mg by nebulization every 6 (six) hours as needed for wheezing. 72 mL 12  . losartan-hydrochlorothiazide (HYZAAR) 100-12.5 MG tablet Take 1 tablet by mouth daily. 90 tablet 1  . montelukast (SINGULAIR) 10 MG tablet Take 1 tablet (10 mg total) by mouth daily. 90 tablet 3  . pantoprazole (PROTONIX) 40 MG tablet Take 40 mg by mouth 2 (two) times daily.   11  . Probiotic Product (PROBIOTIC & ACIDOPHILUS EX ST PO) Take 1 tablet by mouth daily.     Marland Kitchen  SYMBICORT 160-4.5 MCG/ACT inhaler Inhale 2 puffs into the lungs daily.   5  . vitamin B-12 (CYANOCOBALAMIN) 1000 MCG tablet Take 1,000 mcg by mouth daily.     No current facility-administered medications on file prior to visit.     Social History  Substance Use Topics  . Smoking status: Never Smoker  . Smokeless tobacco: Never Used  . Alcohol use 0.0 oz/week     Comment: occ    Review of Systems  Constitutional: Negative for chills, fever and unexpected weight change.  HENT: Positive for congestion, postnasal drip and sinus pressure. Negative for ear pain and trouble swallowing.   Respiratory: Negative for cough, shortness of breath and wheezing.   Cardiovascular: Negative for chest pain and  palpitations.  Gastrointestinal: Negative for nausea and vomiting.      Objective:    BP 108/60   Pulse 63   Temp 98.1 F (36.7 C) (Oral)   Ht 5\' 6"  (1.676 m)   Wt 193 lb 12.8 oz (87.9 kg)   BMI 31.28 kg/m    Physical Exam  Constitutional: She appears well-developed and well-nourished.  HENT:  Head: Normocephalic and atraumatic.  Right Ear: Hearing, tympanic membrane, external ear and ear canal normal. No drainage, swelling or tenderness. No foreign bodies. Tympanic membrane is not erythematous and not bulging. No middle ear effusion. No decreased hearing is noted.  Left Ear: Hearing, tympanic membrane, external ear and ear canal normal. No drainage, swelling or tenderness. No foreign bodies. Tympanic membrane is not erythematous and not bulging.  No middle ear effusion. No decreased hearing is noted.  Nose: Nose normal. No rhinorrhea. Right sinus exhibits no maxillary sinus tenderness and no frontal sinus tenderness. Left sinus exhibits no maxillary sinus tenderness and no frontal sinus tenderness.  Mouth/Throat: Uvula is midline and mucous membranes are normal. Posterior oropharyngeal erythema present. No oropharyngeal exudate, posterior oropharyngeal edema or tonsillar abscesses.  Eyes: Conjunctivae are normal.  Cardiovascular: Regular rhythm, normal heart sounds and normal pulses.   Pulmonary/Chest: Effort normal and breath sounds normal. She has no wheezes. She has no rhonchi. She has no rales.  Lymphadenopathy:       Head (right side): No submental, no submandibular, no tonsillar, no preauricular, no posterior auricular and no occipital adenopathy present.       Head (left side): No submental, no submandibular, no tonsillar, no preauricular, no posterior auricular and no occipital adenopathy present.    She has no cervical adenopathy.  Neurological: She is alert.  Skin: Skin is warm and dry.  Psychiatric: She has a normal mood and affect. Her speech is normal and behavior is  normal. Thought content normal.  Vitals reviewed.      Assessment & Plan:   1. Acute ethmoidal sinusitis, recurrence not specified Afebrile. Due to duration of symptoms and patient preference, we agreed to start antibiotic.  I encouraged her to start Flonase as well as  keep appointment next week with allergy. With her symptoms being more chronic and worsening since removed from [place], I suspect allergic in etiology.  - doxycycline (VIBRA-TABS) 100 MG tablet; Take 1 tablet (100 mg total) by mouth 2 (two) times daily.  Dispense: 14 tablet; Refill: 0    I am having Ms. Walthers start on doxycycline. I am also having her maintain her SYMBICORT, pantoprazole, albuterol, albuterol, BIOTALAN CLEAR, vitamin B-12, Probiotic Product (PROBIOTIC & ACIDOPHILUS EX ST PO), CINNAMON PO, flecainide, furosemide, apixaban, fluticasone, atorvastatin, levalbuterol, chlorpheniramine-HYDROcodone, glipiZIDE, losartan-hydrochlorothiazide, montelukast, and fexofenadine.   Meds  ordered this encounter  Medications  . fexofenadine (ALLEGRA) 180 MG tablet    Sig: Take by mouth.  . doxycycline (VIBRA-TABS) 100 MG tablet    Sig: Take 1 tablet (100 mg total) by mouth 2 (two) times daily.    Dispense:  14 tablet    Refill:  0    Order Specific Question:   Supervising Provider    Answer:   Crecencio Mc [[id number]]    Return precautions given.   Risks, benefits, and alternatives of the medications and treatment plan prescribed today were discussed, and patient expressed understanding.   Education regarding symptom management and diagnosis given to patient on AVS.  Continue to follow with Coral Spikes, DO for routine health maintenance.   Roseann A Mandarino and I agreed with plan.   Mable Paris, FNP

## 2016-02-13 NOTE — Progress Notes (Signed)
Pre visit review using our clinic review tool, if applicable. No additional management support is needed unless otherwise documented below in the visit note. 

## 2016-02-13 NOTE — Patient Instructions (Signed)

## 2016-03-04 ENCOUNTER — Encounter: Payer: Medicare Other | Admitting: Family Medicine

## 2016-03-11 ENCOUNTER — Ambulatory Visit: Payer: Medicare Other | Admitting: Physical Therapy

## 2016-03-25 ENCOUNTER — Encounter: Payer: Medicare Other | Admitting: Family Medicine

## 2016-04-06 ENCOUNTER — Other Ambulatory Visit: Payer: Self-pay | Admitting: Physical Medicine and Rehabilitation

## 2016-04-06 DIAGNOSIS — M5136 Other intervertebral disc degeneration, lumbar region: Secondary | ICD-10-CM

## 2016-04-06 DIAGNOSIS — M5416 Radiculopathy, lumbar region: Secondary | ICD-10-CM

## 2016-04-24 ENCOUNTER — Ambulatory Visit
Admission: RE | Admit: 2016-04-24 | Discharge: 2016-04-24 | Disposition: A | Discharge status: Home / Self Care | Payer: Medicare Other | Source: Ambulatory Visit | Attending: Physical Medicine and Rehabilitation | Admitting: Physical Medicine and Rehabilitation

## 2016-04-24 DIAGNOSIS — N289 Disorder of kidney and ureter, unspecified: Secondary | ICD-10-CM | POA: Diagnosis not present

## 2016-04-24 DIAGNOSIS — M47816 Spondylosis without myelopathy or radiculopathy, lumbar region: Secondary | ICD-10-CM | POA: Insufficient documentation

## 2016-04-24 DIAGNOSIS — M48061 Spinal stenosis, lumbar region without neurogenic claudication: Secondary | ICD-10-CM | POA: Diagnosis not present

## 2016-04-24 DIAGNOSIS — M5416 Radiculopathy, lumbar region: Secondary | ICD-10-CM | POA: Diagnosis not present

## 2016-04-24 DIAGNOSIS — M5136 Other intervertebral disc degeneration, lumbar region: Secondary | ICD-10-CM | POA: Insufficient documentation

## 2016-04-24 DIAGNOSIS — M1288 Other specific arthropathies, not elsewhere classified, other specified site: Secondary | ICD-10-CM | POA: Insufficient documentation

## 2016-05-06 ENCOUNTER — Other Ambulatory Visit: Payer: Self-pay | Admitting: Family Medicine

## 2016-06-10 ENCOUNTER — Other Ambulatory Visit: Payer: Medicare Other

## 2016-06-22 ENCOUNTER — Inpatient Hospital Stay: Admit: 2016-06-22 | Payer: Medicare Other | Admitting: Orthopedic Surgery

## 2016-06-22 SURGERY — ARTHROPLASTY, KNEE, TOTAL, USING IMAGELESS COMPUTER-ASSISTED NAVIGATION
Anesthesia: Choice | Laterality: Right

## 2016-08-18 ENCOUNTER — Other Ambulatory Visit: Payer: Self-pay | Admitting: Adult Health

## 2016-08-18 DIAGNOSIS — R413 Other amnesia: Secondary | ICD-10-CM

## 2016-08-31 ENCOUNTER — Ambulatory Visit
Admission: RE | Admit: 2016-08-31 | Discharge: 2016-08-31 | Disposition: A | Discharge status: Home / Self Care | Payer: Medicare Other | Source: Ambulatory Visit | Attending: Adult Health | Admitting: Adult Health

## 2016-08-31 DIAGNOSIS — D320 Benign neoplasm of cerebral meninges: Secondary | ICD-10-CM | POA: Diagnosis not present

## 2016-08-31 DIAGNOSIS — M4802 Spinal stenosis, cervical region: Secondary | ICD-10-CM | POA: Diagnosis not present

## 2016-08-31 DIAGNOSIS — R413 Other amnesia: Secondary | ICD-10-CM | POA: Insufficient documentation

## 2016-08-31 DIAGNOSIS — M47892 Other spondylosis, cervical region: Secondary | ICD-10-CM | POA: Diagnosis not present

## 2016-10-05 ENCOUNTER — Other Ambulatory Visit: Payer: Self-pay | Admitting: Specialist

## 2016-10-05 DIAGNOSIS — R600 Localized edema: Secondary | ICD-10-CM

## 2016-10-06 ENCOUNTER — Ambulatory Visit
Admission: RE | Admit: 2016-10-06 | Discharge: 2016-10-06 | Disposition: A | Discharge status: Home / Self Care | Payer: Medicare Other | Source: Ambulatory Visit | Attending: Specialist | Admitting: Specialist

## 2016-10-06 DIAGNOSIS — R600 Localized edema: Secondary | ICD-10-CM

## 2016-10-20 ENCOUNTER — Emergency Department: Payer: Medicare Other

## 2016-10-20 ENCOUNTER — Emergency Department
Admission: EM | Admit: 2016-10-20 | Discharge: 2016-10-20 | Disposition: A | Discharge status: Home / Self Care | Payer: Medicare Other | Attending: Emergency Medicine | Admitting: Emergency Medicine

## 2016-10-20 ENCOUNTER — Encounter: Payer: Self-pay | Admitting: Emergency Medicine

## 2016-10-20 DIAGNOSIS — S0083XA Contusion of other part of head, initial encounter: Secondary | ICD-10-CM

## 2016-10-20 DIAGNOSIS — Y999 Unspecified external cause status: Secondary | ICD-10-CM | POA: Insufficient documentation

## 2016-10-20 DIAGNOSIS — H1132 Conjunctival hemorrhage, left eye: Secondary | ICD-10-CM

## 2016-10-20 DIAGNOSIS — I10 Essential (primary) hypertension: Secondary | ICD-10-CM | POA: Insufficient documentation

## 2016-10-20 DIAGNOSIS — Y929 Unspecified place or not applicable: Secondary | ICD-10-CM | POA: Diagnosis not present

## 2016-10-20 DIAGNOSIS — W19XXXA Unspecified fall, initial encounter: Secondary | ICD-10-CM

## 2016-10-20 DIAGNOSIS — Z79899 Other long term (current) drug therapy: Secondary | ICD-10-CM | POA: Diagnosis not present

## 2016-10-20 DIAGNOSIS — S0990XA Unspecified injury of head, initial encounter: Secondary | ICD-10-CM | POA: Diagnosis present

## 2016-10-20 DIAGNOSIS — E119 Type 2 diabetes mellitus without complications: Secondary | ICD-10-CM | POA: Diagnosis not present

## 2016-10-20 DIAGNOSIS — W01198A Fall on same level from slipping, tripping and stumbling with subsequent striking against other object, initial encounter: Secondary | ICD-10-CM | POA: Insufficient documentation

## 2016-10-20 DIAGNOSIS — J45909 Unspecified asthma, uncomplicated: Secondary | ICD-10-CM | POA: Diagnosis not present

## 2016-10-20 DIAGNOSIS — Y9301 Activity, walking, marching and hiking: Secondary | ICD-10-CM | POA: Insufficient documentation

## 2016-10-20 MED ORDER — OXYCODONE-ACETAMINOPHEN 5-325 MG PO TABS
2.0000 | ORAL_TABLET | Freq: Once | ORAL | Status: AC
  Administered 2016-10-20: 2 via ORAL
  Filled 2016-10-20: qty 2

## 2016-10-20 MED ORDER — OXYCODONE-ACETAMINOPHEN 5-325 MG PO TABS: 1.0000 | ORAL_TABLET | Freq: Four times a day (QID) | ORAL | 0 refills | Status: DC | PRN

## 2016-10-20 NOTE — ED Triage Notes (Signed)
Pt walking, tripped on uneven pavement striking left cheek, bilateral knees. Left eye bloodshot. Pt with hx afib( xerelto), DM (fs 149) and HTN.

## 2016-10-20 NOTE — ED Provider Notes (Signed)
[facility] Emergency Department Provider Note       Time seen: ----------------------------------------- 1:44 PM on 10/20/2016 -----------------------------------------     I have reviewed the triage vital signs and the nursing notes.   HISTORY   Chief Complaint Fall    HPI Mallory Arellano is a 76 y.o. female who presents to the ED for a trip and fall. Patient was walking and tripped on uneven pavement striking her left cheek and knees. She was noted to be bleeding around the left eye. She takes Xarelto for chronic atrial fibrillation. She states it felt like her face cracked when she fell. She denies any other injuries or complaints at this time. Pain is 8 out of 10 in her left face   Past Medical History:  Diagnosis Date  . A-fib ([place])   . Asthma   . Diabetes mellitus without complication ([place])   . Hypercholesteremia   . Hypertension   . Palpitation   . UTI (urinary tract infection)     Patient Active Problem List   Diagnosis Date Noted  . Chronic cough 06/18/2015  . Plantar fascial fibromatosis 06/18/2015  . Preventative health care 01/18/2015  . Paroxysmal atrial fibrillation ([place]) 12/30/2014  . Hypertension, essential, benign 10/16/2014  . Mild persistent chronic asthma without complication 85/27/7824  . Allergic rhinitis 10/16/2014  . GERD (gastroesophageal reflux disease) 10/16/2014  . HLD (hyperlipidemia)   . Arthritis of knee, degenerative 09/07/2014  . DM (diabetes mellitus), type 2, uncontrolled ([place]) 10/26/2012    Past Surgical History:  Procedure Laterality Date  . KNEE ARTHROPLASTY Right   . WISDOM TOOTH EXTRACTION      Allergies Warfarin sodium and Penicillins  Social History Social History  Substance Use Topics  . Smoking status: Never Smoker  . Smokeless tobacco: Never Used  . Alcohol use 0.0 oz/week     Comment: occ    Review of Systems Constitutional: Negative for fever. Eyes: Negative for vision changes,  Positive for bleeding around the left eye ENT:  Negative for congestion, sore throat Cardiovascular: Negative for chest pain. Respiratory: Negative for shortness of breath. Gastrointestinal: Negative for abdominal pain, vomiting and diarrhea. Genitourinary: Negative for dysuria. Musculoskeletal: Negative for back pain. Skin: Positive for facial contusion Neurological: Negative for headaches, focal weakness or numbness.  All systems negative/normal/unremarkable except as stated in the HPI  ____________________________________________   PHYSICAL EXAM:  VITAL SIGNS: ED Triage Vitals  Enc Vitals Group     BP 10/20/16 1329 (!) 156/77     Pulse Rate 10/20/16 1329 (!) 52     Resp --      Temp 10/20/16 1329 98.5 F (36.9 C)     Temp Source 10/20/16 1329 Oral     SpO2 10/20/16 1329 98 %     Weight 10/20/16 1330 195 lb (88.5 kg)     Height 10/20/16 1330 5\' 5"  (1.651 m)     Head Circumference --      Peak Flow --      Pain Score 10/20/16 1321 8     Pain Loc --      Pain Edu? --      Excl. in [place]? --     Constitutional: Alert and oriented. Well appearing and in no distress. Eyes: Large left subconjunctival hemorrhage and left, Normal extraocular movements. Normal visual acuity ENT   Head: Normocephalic , left-sided facial trauma, left infraorbital and zygomatic contusion and ecchymosis   Nose: No congestion/rhinnorhea.   Mouth/Throat: Mucous membranes are moist.  Neck: No stridor. Cardiovascular: Irregularly irregular rhythm. No murmurs, rubs, or gallops. Respiratory: Normal respiratory effort without tachypnea nor retractions. Breath sounds are clear and equal bilaterally. No wheezes/rales/rhonchi. Gastrointestinal: Soft and nontender. Normal bowel sounds Musculoskeletal: Nontender with normal range of motion in extremities. No lower extremity tenderness nor edema. Neurologic:  Normal speech and language. No gross focal neurologic deficits are appreciated.  Skin:   Skin is warm, dry and intact. No rash noted. Psychiatric: Mood and affect are normal. Speech and behavior are normal.  ____________________________________________  ED COURSE:  Pertinent labs & imaging results that were available during my care of the patient were reviewed by me and considered in my medical decision making (see chart for details). Patient presents for a fall with head injury, we will assess with labs and imaging as indicated.   Procedures ____________________________________________   RADIOLOGY Images were viewed by me  CT head, max of facial, knee x-ray, humerus x-ray IMPRESSION: 1. No skull fracture or intracranial hemorrhage. 2. No cervical spine fracture or subluxation. 3. Minimal diffuse cerebral atrophy and minimal chronic small vessel white matter ischemic changes in both cerebral hemispheres without significant change. 4. Multilevel cervical spine degenerative changes and reversal of the normal cervical lordosis. ____________________________________________  FINAL ASSESSMENT AND PLAN  Fall, head injury, facial contusion, subconjunctival hemorrhage  Plan: Patient's imaging was dictated above. Patient had presented for a mechanical fall where she tripped and fell and struck the left side of her face. She hasn't no underlying fractures, she'll be treated with pain medicine, ice and outpatient follow-up as needed.   Earleen Newport, MD   Note: This note was generated in part or whole with voice recognition software. Voice recognition is usually quite accurate but there are transcription errors that can and very often do occur. I apologize for any typographical errors that were not detected and corrected.     Earleen Newport, MD 10/20/16 [phone]

## 2017-02-12 ENCOUNTER — Inpatient Hospital Stay
Admission: EM | Admit: 2017-02-12 | Discharge: 2017-02-15 | DRG: 309 | Disposition: A | Discharge status: Home / Self Care | Payer: Medicare Other | Attending: Internal Medicine | Admitting: Internal Medicine

## 2017-02-12 ENCOUNTER — Encounter: Payer: Self-pay | Admitting: *Deleted

## 2017-02-12 DIAGNOSIS — I10 Essential (primary) hypertension: Secondary | ICD-10-CM | POA: Diagnosis present

## 2017-02-12 DIAGNOSIS — Z880 Allergy status to penicillin: Secondary | ICD-10-CM

## 2017-02-12 DIAGNOSIS — J45909 Unspecified asthma, uncomplicated: Secondary | ICD-10-CM | POA: Diagnosis present

## 2017-02-12 DIAGNOSIS — Z7951 Long term (current) use of inhaled steroids: Secondary | ICD-10-CM

## 2017-02-12 DIAGNOSIS — I481 Persistent atrial fibrillation: Secondary | ICD-10-CM | POA: Diagnosis not present

## 2017-02-12 DIAGNOSIS — R001 Bradycardia, unspecified: Secondary | ICD-10-CM | POA: Diagnosis not present

## 2017-02-12 DIAGNOSIS — R42 Dizziness and giddiness: Secondary | ICD-10-CM

## 2017-02-12 DIAGNOSIS — Z7984 Long term (current) use of oral hypoglycemic drugs: Secondary | ICD-10-CM

## 2017-02-12 DIAGNOSIS — E785 Hyperlipidemia, unspecified: Secondary | ICD-10-CM | POA: Diagnosis present

## 2017-02-12 DIAGNOSIS — Z23 Encounter for immunization: Secondary | ICD-10-CM

## 2017-02-12 DIAGNOSIS — E1165 Type 2 diabetes mellitus with hyperglycemia: Secondary | ICD-10-CM

## 2017-02-12 DIAGNOSIS — Z87440 Personal history of urinary (tract) infections: Secondary | ICD-10-CM

## 2017-02-12 DIAGNOSIS — K219 Gastro-esophageal reflux disease without esophagitis: Secondary | ICD-10-CM | POA: Diagnosis present

## 2017-02-12 DIAGNOSIS — R002 Palpitations: Secondary | ICD-10-CM

## 2017-02-12 DIAGNOSIS — Z79899 Other long term (current) drug therapy: Secondary | ICD-10-CM

## 2017-02-12 DIAGNOSIS — IMO0001 Reserved for inherently not codable concepts without codable children: Secondary | ICD-10-CM

## 2017-02-12 DIAGNOSIS — Z79891 Long term (current) use of opiate analgesic: Secondary | ICD-10-CM

## 2017-02-12 DIAGNOSIS — Z833 Family history of diabetes mellitus: Secondary | ICD-10-CM

## 2017-02-12 DIAGNOSIS — Z8249 Family history of ischemic heart disease and other diseases of the circulatory system: Secondary | ICD-10-CM

## 2017-02-12 DIAGNOSIS — R7989 Other specified abnormal findings of blood chemistry: Secondary | ICD-10-CM | POA: Diagnosis present

## 2017-02-12 DIAGNOSIS — E7800 Pure hypercholesterolemia, unspecified: Secondary | ICD-10-CM | POA: Diagnosis present

## 2017-02-12 DIAGNOSIS — I248 Other forms of acute ischemic heart disease: Secondary | ICD-10-CM | POA: Diagnosis present

## 2017-02-12 DIAGNOSIS — E119 Type 2 diabetes mellitus without complications: Secondary | ICD-10-CM | POA: Diagnosis present

## 2017-02-12 DIAGNOSIS — D72829 Elevated white blood cell count, unspecified: Secondary | ICD-10-CM

## 2017-02-12 DIAGNOSIS — Z96651 Presence of right artificial knee joint: Secondary | ICD-10-CM | POA: Diagnosis present

## 2017-02-12 DIAGNOSIS — Z888 Allergy status to other drugs, medicaments and biological substances status: Secondary | ICD-10-CM

## 2017-02-12 DIAGNOSIS — Z7901 Long term (current) use of anticoagulants: Secondary | ICD-10-CM

## 2017-02-12 LAB — CBC WITH DIFFERENTIAL/PLATELET
BASOS PCT: 1 %
Basophils Absolute: 0.1 10*3/uL (ref 0–0.1)
EOS ABS: 0.2 10*3/uL (ref 0–0.7)
Eosinophils Relative: 1 %
HCT: 43.8 % (ref 35.0–47.0)
HEMOGLOBIN: 14.8 g/dL (ref 12.0–16.0)
Lymphocytes Relative: 28 %
Lymphs Abs: 4.6 10*3/uL — ABNORMAL HIGH (ref 1.0–3.6)
MCH: 29.4 pg (ref 26.0–34.0)
MCHC: 33.8 g/dL (ref 32.0–36.0)
MCV: 86.8 fL (ref 80.0–100.0)
MONOS PCT: 6 %
Monocytes Absolute: 0.9 10*3/uL (ref 0.2–0.9)
NEUTROS PCT: 64 %
Neutro Abs: 10.8 10*3/uL — ABNORMAL HIGH (ref 1.4–6.5)
Platelets: 291 10*3/uL (ref 150–440)
RBC: 5.04 MIL/uL (ref 3.80–5.20)
RDW: 13.5 % (ref 11.5–14.5)
WBC: 16.6 10*3/uL — ABNORMAL HIGH (ref 3.6–11.0)

## 2017-02-12 LAB — COMPREHENSIVE METABOLIC PANEL
ALBUMIN: 4.2 g/dL (ref 3.5–5.0)
ALK PHOS: 52 U/L (ref 38–126)
ALT: 17 U/L (ref 14–54)
ANION GAP: 8 (ref 5–15)
AST: 17 U/L (ref 15–41)
BUN: 22 mg/dL — ABNORMAL HIGH (ref 6–20)
CALCIUM: 9.3 mg/dL (ref 8.9–10.3)
CO2: 29 mmol/L (ref 22–32)
Chloride: 100 mmol/L — ABNORMAL LOW (ref 101–111)
Creatinine, Ser: 1.09 mg/dL — ABNORMAL HIGH (ref 0.44–1.00)
GFR calc Af Amer: 56 mL/min — ABNORMAL LOW (ref >60)
GFR calc non Af Amer: 48 mL/min — ABNORMAL LOW (ref >60)
GLUCOSE: 247 mg/dL — AB (ref 65–99)
POTASSIUM: 4.2 mmol/L (ref 3.5–5.1)
SODIUM: 137 mmol/L (ref 135–145)
Total Bilirubin: 1.1 mg/dL (ref 0.3–1.2)
Total Protein: 7.3 g/dL (ref 6.5–8.1)

## 2017-02-12 LAB — GLUCOSE, CAPILLARY
GLUCOSE-CAPILLARY: 176 mg/dL — AB (ref 65–99)
GLUCOSE-CAPILLARY: 196 mg/dL — AB (ref 65–99)

## 2017-02-12 LAB — TROPONIN I
Troponin I: 0.06 ng/mL (ref <0.03)
Troponin I: 0.06 ng/mL (ref <0.03)
Troponin I: 0.06 ng/mL (ref <0.03)

## 2017-02-12 MED ORDER — FUROSEMIDE 20 MG PO TABS: 20.0000 mg | ORAL_TABLET | Freq: Every day | ORAL | Status: DC | PRN

## 2017-02-12 MED ORDER — INSULIN ASPART 100 UNIT/ML ~~LOC~~ SOLN
0.0000 [IU] | Freq: Every day | SUBCUTANEOUS | Status: DC
  Administered 2017-02-13: 2 [IU] via SUBCUTANEOUS
  Filled 2017-02-12: qty 1

## 2017-02-12 MED ORDER — HYDRALAZINE HCL 20 MG/ML IJ SOLN: 5.0000 mg | Freq: Four times a day (QID) | INTRAMUSCULAR | Status: DC | PRN

## 2017-02-12 MED ORDER — INFLUENZA VAC SPLIT HIGH-DOSE 0.5 ML IM SUSY
0.5000 mL | PREFILLED_SYRINGE | INTRAMUSCULAR | Status: AC
  Administered 2017-02-13: 0.5 mL via INTRAMUSCULAR
  Filled 2017-02-12: qty 0.5

## 2017-02-12 MED ORDER — MONTELUKAST SODIUM 10 MG PO TABS: 10.0000 mg | ORAL_TABLET | Freq: Every day | ORAL | Status: DC | PRN

## 2017-02-12 MED ORDER — MOMETASONE FURO-FORMOTEROL FUM 200-5 MCG/ACT IN AERO
2.0000 | INHALATION_SPRAY | Freq: Two times a day (BID) | RESPIRATORY_TRACT | Status: DC
  Administered 2017-02-12 – 2017-02-15 (×6): 2 via RESPIRATORY_TRACT
  Filled 2017-02-12: qty 8.8

## 2017-02-12 MED ORDER — ALBUTEROL SULFATE (2.5 MG/3ML) 0.083% IN NEBU: 2.5000 mg | INHALATION_SOLUTION | Freq: Four times a day (QID) | RESPIRATORY_TRACT | Status: DC | PRN

## 2017-02-12 MED ORDER — CELECOXIB 200 MG PO CAPS
200.0000 mg | ORAL_CAPSULE | Freq: Every day | ORAL | Status: DC
  Administered 2017-02-13 – 2017-02-15 (×3): 200 mg via ORAL
  Filled 2017-02-12 (×3): qty 1

## 2017-02-12 MED ORDER — PANTOPRAZOLE SODIUM 40 MG PO TBEC
40.0000 mg | DELAYED_RELEASE_TABLET | Freq: Two times a day (BID) | ORAL | Status: DC
  Administered 2017-02-12 – 2017-02-15 (×6): 40 mg via ORAL
  Filled 2017-02-12 (×6): qty 1

## 2017-02-12 MED ORDER — HYDROCHLOROTHIAZIDE 25 MG PO TABS
25.0000 mg | ORAL_TABLET | Freq: Every day | ORAL | Status: DC
  Administered 2017-02-13 – 2017-02-15 (×3): 25 mg via ORAL
  Filled 2017-02-12 (×3): qty 1

## 2017-02-12 MED ORDER — ACETAMINOPHEN 325 MG PO TABS
650.0000 mg | ORAL_TABLET | Freq: Four times a day (QID) | ORAL | Status: DC | PRN
  Administered 2017-02-13 – 2017-02-15 (×6): 650 mg via ORAL
  Filled 2017-02-12 (×6): qty 2

## 2017-02-12 MED ORDER — ACETAMINOPHEN 650 MG RE SUPP: 650.0000 mg | Freq: Four times a day (QID) | RECTAL | Status: DC | PRN

## 2017-02-12 MED ORDER — FLUTICASONE PROPIONATE 50 MCG/ACT NA SUSP
2.0000 | Freq: Every day | NASAL | Status: DC
  Administered 2017-02-13 – 2017-02-15 (×3): 2 via NASAL
  Filled 2017-02-12: qty 16

## 2017-02-12 MED ORDER — ONDANSETRON HCL 4 MG PO TABS: 4.0000 mg | ORAL_TABLET | Freq: Four times a day (QID) | ORAL | Status: DC | PRN

## 2017-02-12 MED ORDER — INSULIN ASPART 100 UNIT/ML ~~LOC~~ SOLN
0.0000 [IU] | Freq: Three times a day (TID) | SUBCUTANEOUS | Status: DC
  Administered 2017-02-13 – 2017-02-15 (×9): 2 [IU] via SUBCUTANEOUS
  Filled 2017-02-12 (×9): qty 1

## 2017-02-12 MED ORDER — ATROPINE SULFATE 1 MG/10ML IJ SOSY: 0.5000 mg | PREFILLED_SYRINGE | INTRAMUSCULAR | Status: DC | PRN

## 2017-02-12 MED ORDER — ALBUTEROL SULFATE HFA 108 (90 BASE) MCG/ACT IN AERS: 2.0000 | INHALATION_SPRAY | Freq: Four times a day (QID) | RESPIRATORY_TRACT | Status: DC | PRN

## 2017-02-12 MED ORDER — ATROPINE SULFATE 1 MG/10ML IJ SOSY
0.5000 mg | PREFILLED_SYRINGE | Freq: Once | INTRAMUSCULAR | Status: AC
  Administered 2017-02-12: 0.5 mg via INTRAVENOUS
  Filled 2017-02-12: qty 10

## 2017-02-12 MED ORDER — POLYETHYLENE GLYCOL 3350 17 G PO PACK: 17.0000 g | PACK | Freq: Every day | ORAL | Status: DC | PRN

## 2017-02-12 MED ORDER — ONDANSETRON HCL 4 MG/2ML IJ SOLN: 4.0000 mg | Freq: Four times a day (QID) | INTRAMUSCULAR | Status: DC | PRN

## 2017-02-12 NOTE — Consult Note (Signed)
Cardiology Consultation:   Patient ID: Mallory Arellano; [id number]; [birth year]   Admit date: 02/12/2017 Date of Consult: 02/12/2017  Primary Care Provider: System, Pcp Not In Primary Cardiologist:  Dr. Clayborn Bigness.  Primary Electrophysiologist:  None   Patient Profile:   Mallory Arellano is a 76 y.o. female with a hx of persistent atrial fibrillation maintained in sinus rhythm with flecainide and Toprol who is being seen today for the evaluation of symptomatic bradycardia at the request of Dr. Margaretmary Eddy.  History of Present Illness:   Mallory Arellano  Is a 76 year old female with past medical history of persistent atrial fibrillation maintained in sinus rhythm with flecainide and metoprolol, long-term anticoagulation with Xarelto, type 2 diabetes, hypertension and hyperlipidemia.  The patient had an episode of lightheadedness yesterday while she was sitting at the pool. She did not have syncope. She felt weak. She went to orthopedic appointment this morning and had a knee injection. After that, she felt extremely lightheaded with presyncope. She was noted to be bradycardic with heart rate in the upper 20s and low 30s. She was brought to the emergency room and was noted to be in sinus bradycardia with up to 4 second pauses. She was given one dose of atropine with improvement in heart rate. She denies any chest pain. She does feel weak with mild shortness of breath. She is not dizzy anymore but does become lightheaed with standi up. No previous syncope.  Past Medical History:  Diagnosis Date  . A-fib ([place])   . Asthma   . Diabetes mellitus without complication ([place])   . Hypercholesteremia   . Hypertension   . Palpitation   . UTI (urinary tract infection)     Past Surgical History:  Procedure Laterality Date  . KNEE ARTHROPLASTY Right   . WISDOM TOOTH EXTRACTION       Home Medications:  Prior to Admission medications   Medication Sig Start Date End Date Taking? Authorizing Provider  albuterol  (PROVENTIL HFA;VENTOLIN HFA) 108 (90 BASE) MCG/ACT inhaler Inhale 2 puffs into the lungs every 6 (six) hours as needed for wheezing or shortness of breath.    Yes [provider]  albuterol (PROVENTIL) (2.5 MG/3ML) 0.083% nebulizer solution Take 2.5 mg by nebulization every 6 (six) hours as needed for wheezing or shortness of breath.   Yes [provider]  budesonide-formoterol (SYMBICORT) 160-4.5 MCG/ACT inhaler Inhale 2 puffs into the lungs 2 (two) times daily.   Yes [provider]  celecoxib (CELEBREX) 200 MG capsule TAKE 1 CAPSULE (200 MG TOTAL) BY MOUTH ONCE DAILY. 08/07/16  Yes [provider]  flecainide (TAMBOCOR) 150 MG tablet Take 1 tablet (150 mg total) by mouth 2 (two) times daily. 03/07/15  Yes Cook, Jayce G, DO  fluticasone (FLONASE) 50 MCG/ACT nasal spray Place 2 sprays into both nostrils daily. 05/23/15  Yes Cook, Jayce G, DO  furosemide (LASIX) 20 MG tablet Take 1 tablet (20 mg total) by mouth daily. Patient taking differently: Take 20 mg by mouth daily as needed for fluid or edema.  03/07/15  Yes Cook, Jayce G, DO  hydrochlorothiazide (HYDRODIURIL) 25 MG tablet Take 25 mg by mouth daily.   Yes [provider]  hydrOXYzine (ATARAX/VISTARIL) 25 MG tablet Take 25 mg by mouth 3 (three) times daily as needed for itching.    Yes [provider]  metoprolol succinate (TOPROL-XL) 50 MG 24 hr tablet Take 50 mg by mouth daily.   Yes [provider]  montelukast (SINGULAIR) 10 MG  tablet Take 1 tablet (10 mg total) by mouth daily. Patient taking differently: Take 10 mg by mouth daily as needed. For allergies 09/24/15  Yes Cook, Jayce G, DO  pantoprazole (PROTONIX) 40 MG tablet Take 40 mg by mouth 2 (two) times daily.  09/05/14  Yes [provider]  rivaroxaban (XARELTO) 20 MG TABS tablet Take 20 mg by mouth daily with breakfast.   Yes [provider]    Inpatient Medications: Scheduled Meds: . [START ON 02/13/2017]  celecoxib  200 mg Oral Daily  . [START ON 02/13/2017] fluticasone  2 spray Each Nare Daily  . [START ON 02/13/2017] hydrochlorothiazide  25 mg Oral Daily  . [START ON 02/13/2017] Influenza vac split quadrivalent PF  0.5 mL Intramuscular Tomorrow-1000  . insulin aspart  0-5 Units Subcutaneous QHS  . insulin aspart  0-9 Units Subcutaneous TID WC  . mometasone-formoterol  2 puff Inhalation BID  . pantoprazole  40 mg Oral BID   Continuous Infusions:  PRN Meds: acetaminophen **OR** acetaminophen, albuterol, atropine, furosemide, hydrALAZINE, montelukast, ondansetron **OR** ondansetron (ZOFRAN) IV, polyethylene glycol  Allergies:    Allergies  Allergen Reactions  . Warfarin Sodium Other (See Comments)    Patient didn't know what happened but states she couldn't take it  . Penicillins Itching    Has patient had a PCN reaction causing immediate rash, facial/tongue/throat swelling, SOB or lightheadedness with hypotension: No Has patient had a PCN reaction causing severe rash involving mucus membranes or skin necrosis: No Has patient had a PCN reaction that required hospitalization: No Has patient had a PCN reaction occurring within the last 10 years: No If all of the above answers are "NO", then may proceed with Cephalosporin use.     Social History:   Social History   Social History  . Marital status: Widowed    Spouse name: N/A  . Number of children: N/A  . Years of education: N/A   Occupational History  . Not on file.   Social History Main Topics  . Smoking status: Never Smoker  . Smokeless tobacco: Never Used  . Alcohol use 0.0 oz/week     Comment: occ  . Drug use: No  . Sexual activity: Not Currently    Partners: Male    Birth control/ protection: Post-menopausal   Other Topics Concern  . Not on file   Social History Narrative  . No narrative on file    Family History:    Family History  Problem Relation Age of Onset  . Diabetes Mother   . Hypertension Mother     . Hypertension Brother   . Diabetes Brother      ROS:  Please see the history of present illness.  ROS  All other ROS reviewed and negative.     Physical Exam/Data:   Vitals:   02/12/17 1300 02/12/17 1330 02/12/17 1400 02/12/17 1448  BP: (!) 167/67 (!) 161/81 (!) 153/70 (!) 146/83  Pulse: (!) 48 (!) 47  (!) 50  Resp: 16 18 18 18   Temp:    97.9 F (36.6 C)  TempSrc:      SpO2: 99% 99%  99%  Weight:      Height:       No intake or output data in the 24 hours ending 02/12/17 1733 Filed Weights   02/12/17 1047  Weight: 200 lb (90.7 kg)   Body mass index is 32.28 kg/m.  General:  Well nourished, well developed, in no acute distress HEENT: normal Lymph: no adenopathy  Neck: no JVD Endocrine:  No thryomegaly Vascular: No carotid bruits; FA pulses 2+ bilaterally without bruits  Cardiac:  normal S1, S2; RRR bradycardic; no murmur  Lungs:  clear to auscultation bilaterally, no wheezing, rhonchi or rales  Abd: soft, nontender, no hepatomegaly  Ext: no edema Musculoskeletal:  No deformities, BUE and BLE strength normal and equal Skin: warm and dry  Neuro:  CNs 2-12 intact, no focal abnormalities noted Psych:  Normal affect   EKG:  The EKG was personally reviewed and demonstrates:  Sinus bradycardia with inferolateral T wave changes. Telemetry:  Telemetry was personally reviewed and demonstrates:  Sinus bradycardia with heart rate of 40-50 at the present time.  Relevant CV Studies: Echocardiogram is pending.  Laboratory Data:  Chemistry Recent Labs Lab 02/12/17 1104  NA 137  K 4.2  CL 100*  CO2 29  GLUCOSE 247*  BUN 22*  CREATININE 1.09*  CALCIUM 9.3  GFRNONAA 48*  GFRAA 56*  ANIONGAP 8     Recent Labs Lab 02/12/17 1104  PROT 7.3  ALBUMIN 4.2  AST 17  ALT 17  ALKPHOS 52  BILITOT 1.1   Hematology Recent Labs Lab 02/12/17 1104  WBC 16.6*  RBC 5.04  HGB 14.8  HCT 43.8  MCV 86.8  MCH 29.4  MCHC 33.8  RDW 13.5  PLT 291   Cardiac  Enzymes Recent Labs Lab 02/12/17 1104 02/12/17 1519  TROPONINI 0.06* 0.06*   No results for input(s): TROPIPOC in the last 168 hours.  BNPNo results for input(s): BNP, PROBNP in the last 168 hours.  DDimer No results for input(s): DDIMER in the last 168 hours.  Radiology/Studies:  No results found.  Assessment and Plan:   1. 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 At the present time I recommend holding metoprolol and flecainide. I agree with holding Xarelto daily until we make sure a pacemaker was not needed. We can consider unfractionated heparin in the meanwhile.TSH is pending. There is no indication for temporary pacemaker at the present time. Once heart rate improves, we can resume flecainide and Toprol at a lower dose  2. Mildly elevated troponin likely supply demand ischemia. Echocardiogram is pending. No symptoms of angina.  3. Persistent atrial fibrillation: Currently  In sinus bradycardia. Plan is as outlined above.    Signed, Kathlyn Sacramento, MD  02/12/2017 5:33 PM

## 2017-02-12 NOTE — H&P (Signed)
[facility] NAME: Mallory Arellano    MR#:  [id number]  DATE OF BIRTH:  1941/04/11  DATE OF ADMISSION:  02/12/2017  PRIMARY CARE PHYSICIAN: System, Pcp Not In   REQUESTING/REFERRING PHYSICIAN: Archie Balboa  CHIEF COMPLAINT:  Dizziness and palpitations  HISTORY OF PRESENT ILLNESS:  Mallory Arellano  is a 76 y.o. female with a known history of Atrial fibrillation on Xarelto and Toprol-XL and flecainide, diabetes mellitus, hyperlipidemia hypertension is present into the ED with a chief complaint of lightheadedness and palpitations. Patient denies any loss of consciousness or chest pain. Denies any shortness of breath. EKG has revealed sinus bradycardia heart rate at around 37 with 4 second pauses Patient was given atropine IV and hospitalist team is called to admit the patient. The ED physician has discussed with the on-call cardiologist Dr. Nehemiah Massed regarding the patient's situation and he has recommended to discontinue Toprol. During my exam as patient is very anxious and heart rate is at around 50s after pushing atropine  PAST MEDICAL HISTORY:   Past Medical History:  Diagnosis Date  . A-fib ([place])   . Asthma   . Diabetes mellitus without complication ([place])   . Hypercholesteremia   . Hypertension   . Palpitation   . UTI (urinary tract infection)     PAST SURGICAL HISTOIRY:   Past Surgical History:  Procedure Laterality Date  . KNEE ARTHROPLASTY Right   . WISDOM TOOTH EXTRACTION      SOCIAL HISTORY:   Social History  Substance Use Topics  . Smoking status: Never Smoker  . Smokeless tobacco: Never Used  . Alcohol use 0.0 oz/week     Comment: occ    FAMILY HISTORY:   Family History  Problem Relation Age of Onset  . Diabetes Mother   . Hypertension Mother   . Hypertension Brother   . Diabetes Brother     DRUG ALLERGIES:   Allergies  Allergen Reactions  . Warfarin Sodium Other (See Comments)    Patient didn't know  what happened but states she couldn't take it  . Penicillins Itching    Has patient had a PCN reaction causing immediate rash, facial/tongue/throat swelling, SOB or lightheadedness with hypotension: No Has patient had a PCN reaction causing severe rash involving mucus membranes or skin necrosis: No Has patient had a PCN reaction that required hospitalization: No Has patient had a PCN reaction occurring within the last 10 years: No If all of the above answers are "NO", then may proceed with Cephalosporin use.     REVIEW OF SYSTEMS:  CONSTITUTIONAL: No fever, fatigue or weakness.  EYES: No blurred or double vision.  EARS, NOSE, AND THROAT: No tinnitus or ear pain.  RESPIRATORY: No cough, shortness of breath, wheezing or hemoptysis.  CARDIOVASCULAR: No chest pain, orthopnea, edema.  GASTROINTESTINAL: No nausea, vomiting, diarrhea or abdominal pain.  GENITOURINARY: No dysuria, hematuria.  ENDOCRINE: No polyuria, nocturia,  HEMATOLOGY: No anemia, easy bruising or bleeding SKIN: No rash or lesion. MUSCULOSKELETAL: No joint pain or arthritis.   NEUROLOGIC: No tingling, numbness, weakness.  PSYCHIATRY: No anxiety or depression.   MEDICATIONS AT HOME:   Prior to Admission medications   Medication Sig Start Date End Date Taking? Authorizing Provider  albuterol (PROVENTIL HFA;VENTOLIN HFA) 108 (90 BASE) MCG/ACT inhaler Inhale 2 puffs into the lungs every 6 (six) hours as needed for wheezing or shortness of breath.    Yes [provider]  albuterol (PROVENTIL) (2.5 MG/3ML) 0.083% nebulizer  solution Take 2.5 mg by nebulization every 6 (six) hours as needed for wheezing or shortness of breath.   Yes [provider]  budesonide-formoterol (SYMBICORT) 160-4.5 MCG/ACT inhaler Inhale 2 puffs into the lungs 2 (two) times daily.   Yes [provider]  celecoxib (CELEBREX) 200 MG capsule TAKE 1 CAPSULE (200 MG TOTAL) BY MOUTH ONCE DAILY. 08/07/16  Yes [provider]   flecainide (TAMBOCOR) 150 MG tablet Take 1 tablet (150 mg total) by mouth 2 (two) times daily. 03/07/15  Yes Cook, Jayce G, DO  fluticasone (FLONASE) 50 MCG/ACT nasal spray Place 2 sprays into both nostrils daily. 05/23/15  Yes Cook, Jayce G, DO  furosemide (LASIX) 20 MG tablet Take 1 tablet (20 mg total) by mouth daily. Patient taking differently: Take 20 mg by mouth daily as needed for fluid or edema.  03/07/15  Yes Cook, Jayce G, DO  hydrochlorothiazide (HYDRODIURIL) 25 MG tablet Take 25 mg by mouth daily.   Yes [provider]  hydrOXYzine (ATARAX/VISTARIL) 25 MG tablet Take 25 mg by mouth 3 (three) times daily as needed for itching.    Yes [provider]  metoprolol succinate (TOPROL-XL) 50 MG 24 hr tablet Take 50 mg by mouth daily.   Yes [provider]  montelukast (SINGULAIR) 10 MG tablet Take 1 tablet (10 mg total) by mouth daily. Patient taking differently: Take 10 mg by mouth daily as needed. For allergies 09/24/15  Yes Cook, Jayce G, DO  pantoprazole (PROTONIX) 40 MG tablet Take 40 mg by mouth 2 (two) times daily.  09/05/14  Yes [provider]  rivaroxaban (XARELTO) 20 MG TABS tablet Take 20 mg by mouth daily with breakfast.   Yes [provider]      VITAL SIGNS:  Blood pressure (!) 154/89, pulse (!) 49, temperature 98.1 F (36.7 C), temperature source Oral, resp. rate 18, height 5\' 6"  (1.676 m), weight 90.7 kg (200 lb), SpO2 99 %.  PHYSICAL EXAMINATION:  GENERAL:  76 y.o.-year-old patient lying in the bed with no acute distress.  EYES: Pupils equal, round, reactive to light and accommodation. No scleral icterus. Extraocular muscles intact.  HEENT: Head atraumatic, normocephalic. Oropharynx and nasopharynx clear.  NECK:  Supple, no jugular venous distention. No thyroid enlargement, no tenderness.  LUNGS: Normal breath sounds bilaterally, no wheezing, rales,rhonchi or crepitation. No use of accessory muscles of respiration.   CARDIOVASCULAR: S1, S2 normal. No murmurs, rubs, or gallops.  ABDOMEN: Soft, nontender, nondistended. Bowel sounds present. No organomegaly or mass.  EXTREMITIES: No pedal edema, cyanosis, or clubbing.  NEUROLOGIC: Cranial nerves II through XII are intact. Muscle strength 5/5 in all extremities. Sensation intact. Gait not checked.  PSYCHIATRIC: The patient is alert and oriented x 3.  SKIN: No obvious rash, lesion, or ulcer.   LABORATORY PANEL:   CBC  Recent Labs Lab 02/12/17 1104  WBC 16.6*  HGB 14.8  HCT 43.8  PLT 291   ------------------------------------------------------------------------------------------------------------------  Chemistries   Recent Labs Lab 02/12/17 1104  NA 137  K 4.2  CL 100*  CO2 29  GLUCOSE 247*  BUN 22*  CREATININE 1.09*  CALCIUM 9.3  AST 17  ALT 17  ALKPHOS 52  BILITOT 1.1   ------------------------------------------------------------------------------------------------------------------  Cardiac Enzymes  Recent Labs Lab 02/12/17 1104  TROPONINI 0.06*   ------------------------------------------------------------------------------------------------------------------  RADIOLOGY:  No results found.  EKG:   Orders placed or performed during the hospital encounter of 02/12/17  . EKG 12-Lead  . EKG 12-Lead    IMPRESSION  AND PLAN:   Mallory Arellano  is a 76 y.o. female with a known history of Atrial fibrillation on Xarelto and Toprol-XL and flecainide, diabetes mellitus, hyperlipidemia hypertension is present into the ED with a chief complaint of lightheadedness and palpitations. Patient denies any loss of consciousness or chest pain. Denies any shortness of breath. EKG has revealed sinus bradycardia heart rate at around 37 with 4 second pauses Patient was given atropine IV and hospitalist team is called to admit the patient. The ED physician has discussed with the on-call cardiologist Dr. Nehemiah Massed regarding the patient's situation  and he has recommended to discontinue Toprol.    # symptomatic bradycardia with 4 second pauses Admit patient to telemetry and monitor on telemetry Discontinue Toprol, flecainide and Xarelto Atropine at bedside and pacer pads at bedside Patient had one dose of atropine pushed in the ED and currently heart rate is at around 50 Discussed with cardiology Dr. Nehemiah Massed he is aware of the case  #Chronic history of atrial fibrillation   currently in sinus bradycardia Holding Toprol, click needed and Xarelto for possible pacemaker placement if needed  #Diabetes mellitus  Sliding scale insulin and check hemoglobin A1c Not on any home medications, diet controlled  #Essential hypertension Continue hydrochlorothiazide and holding Toprol XL Will give hydralazine as needed basis   #GERD continue Protonix   DVT prophylaxis with SCDs          All the  records are reviewed and case discussed with ED provider. Management plans discussed with the patient, daughter  and they are in agreement.  CODE STATUS:fc,Son and daughters are healthcare power of attorney  TOTAL TIME TAKING CARE OF THIS PATIENT: 45 minutes.   Note: This dictation was prepared with Dragon dictation along with smaller phrase technology. Any transcriptional errors that result from this process are unintentional.  Nicholes Mango M.D on 02/12/2017 at 1:35 PM  Between 7am to 6pm - Pager - [phone]  After 6pm go to [email or web address] - password [email or web address] [facility]  Office  [phone]  CC: Primary care physician; System, Pcp Not In

## 2017-02-12 NOTE — ED Triage Notes (Signed)
Pt complains of irregular heart beat with palpations today, pt denies chest pain, pt denies any other symptoms,

## 2017-02-12 NOTE — Progress Notes (Signed)
I Have discussed with patient's daughter who lives in [place] over phone, who is healthcare power of attorney regarding patient's diagnosis and plan of care in detail at the time of admission on patient's request Another daughter who lives locally came into the hospital and asked me over phone to come and talk to her face-to-face regarding the patient's diagnosis and plan of care  in the evening. I have told the daughter that I will come and talk to her in few minutes as I was in the middle of an admission. She states ''no matter how many patients you have ,you should move your butt and discuss with me regarding my mom's situation right now "  as patient is her mom and I'm getting paid for that. Daughter was yelling and  screaming at me and was not listenIng to me , started using obscene language

## 2017-02-12 NOTE — ED Notes (Addendum)
AAOx3.  Skin warm and dry. Denies c/o CP or SOB.  External pacing pads placed on patient with Zoll defib at bedside.  0.5 mg Atropine given IV.  No change in cardiac rhythm.  Remains SB 51.  No ectopy noted.  Continue to monitor.

## 2017-02-12 NOTE — ED Provider Notes (Signed)
[facility] Emergency Department Provider Note   ____________________________________________   I have reviewed the triage vital signs and the nursing notes.   HISTORY  Chief Complaint Palpitations   History limited by: Not Limited   HPI Mallory Arellano is a 76 y.o. female who presents to the emergency department today because of concerns for lightheadedness and palpitations.the patient states that she first started feeling palpitations yesterday. They then went away and she was feeling better this morning. She in fact was able to make it to an appointment and get her knees injected. Shortly after her appointment that she started feeling lightheaded. She felt like she might pass out. At the same time she was feeling like she had palpitations. The patient denies any recent medication changes. She denies any recent illnesses.   Past Medical History:  Diagnosis Date  . A-fib ([place])   . Asthma   . Diabetes mellitus without complication ([place])   . Hypercholesteremia   . Hypertension   . Palpitation   . UTI (urinary tract infection)     Patient Active Problem List   Diagnosis Date Noted  . Chronic cough 06/18/2015  . Plantar fascial fibromatosis 06/18/2015  . Preventative health care 01/18/2015  . Paroxysmal atrial fibrillation ([place]) 12/30/2014  . Hypertension, essential, benign 10/16/2014  . Mild persistent chronic asthma without complication 16/02/9603  . Allergic rhinitis 10/16/2014  . GERD (gastroesophageal reflux disease) 10/16/2014  . HLD (hyperlipidemia)   . Arthritis of knee, degenerative 09/07/2014  . DM (diabetes mellitus), type 2, uncontrolled ([place]) 10/26/2012    Past Surgical History:  Procedure Laterality Date  . KNEE ARTHROPLASTY Right   . WISDOM TOOTH EXTRACTION      Prior to Admission medications   Medication Sig Start Date End Date Taking? Authorizing Provider  albuterol (PROVENTIL HFA;VENTOLIN HFA) 108 (90 BASE) MCG/ACT inhaler  Inhale into the lungs every 6 (six) hours as needed for wheezing or shortness of breath.    [provider]  albuterol (PROVENTIL) (2.5 MG/3ML) 0.083% nebulizer solution Take 2.5 mg by nebulization every 6 (six) hours as needed for wheezing or shortness of breath.    [provider]  apixaban (ELIQUIS) 5 MG TABS tablet Take 1 tablet (5 mg total) by mouth 2 (two) times daily. 03/07/15   Coral Spikes, DO  atorvastatin (LIPITOR) 40 MG tablet Take 1 tablet (40 mg total) by mouth daily. 05/23/15   Cook, Barnie Del, DO  B Complex-Lysine-Zn-FA (BIOTALAN CLEAR) LIQD Take 1 tablet by mouth daily.     [provider]  celecoxib (CELEBREX) 200 MG capsule TAKE 1 CAPSULE (200 MG TOTAL) BY MOUTH ONCE DAILY. 08/07/16   [provider]  chlorpheniramine-HYDROcodone (TUSSIONEX PENNKINETIC ER) 10-8 MG/5ML SUER Take 5 mLs by mouth every 12 (twelve) hours as needed. Patient not taking: Reported on 10/20/2016 06/18/15   Coral Spikes, DO  CINNAMON PO Take 1 tablet by mouth daily.    [provider]  diclofenac sodium (VOLTAREN) 1 % GEL Apply topically. 02/04/15   [provider]  doxycycline (VIBRA-TABS) 100 MG tablet Take 1 tablet (100 mg total) by mouth 2 (two) times daily. Patient not taking: Reported on 10/20/2016 02/13/16   Burnard Hawthorne, FNP  fexofenadine (ALLEGRA) 180 MG tablet Take by mouth daily as needed.  02/10/16 02/09/17  [provider]  flecainide (TAMBOCOR) 150 MG tablet Take 1 tablet (150 mg total) by mouth 2 (two) times daily. 03/07/15   Coral Spikes, DO  fluticasone (  FLONASE) 50 MCG/ACT nasal spray Place 2 sprays into both nostrils daily. 05/23/15   Coral Spikes, DO  furosemide (LASIX) 20 MG tablet Take 1 tablet (20 mg total) by mouth daily. 03/07/15   Coral Spikes, DO  glipiZIDE (GLUCOTROL XL) 5 MG 24 hr tablet Take 1 tablet (5 mg total) by mouth daily with breakfast. 07/19/15   Coral Spikes, DO  levalbuterol (XOPENEX) 1.25 MG/3ML nebulizer  solution Take 1.25 mg by nebulization every 6 (six) hours as needed for wheezing. 05/23/15   Coral Spikes, DO  losartan-hydrochlorothiazide (HYZAAR) 100-12.5 MG tablet Take 1 tablet by mouth daily. 09/24/15   Coral Spikes, DO  montelukast (SINGULAIR) 10 MG tablet Take 1 tablet (10 mg total) by mouth daily. 09/24/15   Coral Spikes, DO  oxyCODONE-acetaminophen (PERCOCET) 5-325 MG tablet Take 1-2 tablets by mouth every 6 (six) hours as needed. 10/20/16   Earleen Newport, MD  pantoprazole (PROTONIX) 40 MG tablet Take 40 mg by mouth 2 (two) times daily.  09/05/14   [provider]  Probiotic Product (PROBIOTIC & ACIDOPHILUS EX ST PO) Take 1 tablet by mouth daily.     [provider]  SYMBICORT 160-4.5 MCG/ACT inhaler Inhale 2 puffs into the lungs daily.  09/07/14   [provider]  vitamin B-12 (CYANOCOBALAMIN) 1000 MCG tablet Take 1,000 mcg by mouth daily.    [provider]  Vitamin D, Ergocalciferol, (DRISDOL) 50000 units CAPS capsule Take by mouth 2 (two) times a week.  09/03/16   [provider]    Allergies Warfarin sodium and Penicillins  Family History  Problem Relation Age of Onset  . Diabetes Mother   . Hypertension Mother   . Hypertension Brother   . Diabetes Brother     Social History Social History  Substance Use Topics  . Smoking status: Never Smoker  . Smokeless tobacco: Never Used  . Alcohol use 0.0 oz/week     Comment: occ    Review of Systems Constitutional: No fever/chills Eyes: No visual changes. ENT: No sore throat. Cardiovascular: positive for palpitations Respiratory: Denies shortness of breath. Gastrointestinal: No abdominal pain.  No nausea, no vomiting.  No diarrhea.   Genitourinary: Negative for dysuria. Musculoskeletal: Negative for back pain. Skin: Negative for rash. Neurological: Negative for headaches, focal weakness or numbness.  ____________________________________________   PHYSICAL EXAM:  VITAL  SIGNS: ED Triage Vitals [02/12/17 1047]  Enc Vitals Group     BP 167/62     Pulse 53     Resp 18     Temp 98.1     Temp src      SpO2 98     Weight 200 lb (90.7 kg)     Height 5\' 6"  (1.676 m)    Constitutional: Alert and oriented. Well appearing and in no distress. Eyes: Conjunctivae are normal.  ENT   Head: Normocephalic and atraumatic.   Nose: No congestion/rhinnorhea.   Mouth/Throat: Mucous membranes are moist.   Neck: No stridor. Hematological/Lymphatic/Immunilogical: No cervical lymphadenopathy. Cardiovascular: Bradycardic. Irregular rhythm.  No murmurs, rubs, or gallops. Respiratory: Normal respiratory effort without tachypnea nor retractions. Breath sounds are clear and equal bilaterally. No wheezes/rales/rhonchi. Gastrointestinal: Soft and non tender. No rebound. No guarding.  Genitourinary: Deferred Musculoskeletal: Normal range of motion in all extremities. No lower extremity edema. Neurologic:  Normal speech and language. No gross focal neurologic deficits are appreciated.  Skin:  Skin is warm, dry and intact. No rash noted. Psychiatric: Mood and affect  are normal. Speech and behavior are normal. Patient exhibits appropriate insight and judgment.  ____________________________________________    LABS (pertinent positives/negatives)  CBC  WBC 16.6 Hgb 14.8 Pl 291 CMP K 4.2 Glu 247 Cr 1.09  ____________________________________________   EKG  I, Nance Pear, attending physician, personally viewed and interpreted this EKG  EKG Time: 1046 Rate: 37 Rhythm: sinus bradycardia Axis: left axis deviation Intervals: qtc 409 QRS: LVH, narrow, q waves V1 ST changes: no st elevation Impression: abnormal ekg   ____________________________________________    RADIOLOGY  None  ____________________________________________   PROCEDURES  Procedures  CRITICAL CARE Performed by: Nance Pear   Total critical care time: 30  minutes  Critical care time was exclusive of separately billable procedures and treating other patients.  Critical care was necessary to treat or prevent imminent or life-threatening deterioration.  Critical care was time spent personally by me on the following activities: development of treatment plan with patient and/or surrogate as well as nursing, discussions with consultants, evaluation of patient's response to treatment, examination of patient, obtaining history from patient or surrogate, ordering and performing treatments and interventions, ordering and review of laboratory studies, ordering and review of radiographic studies, pulse oximetry and re-evaluation of patient's condition.  ____________________________________________   INITIAL IMPRESSION / ASSESSMENT AND PLAN / ED COURSE  Pertinent labs & imaging results that were available during my care of the patient were reviewed by me and considered in my medical decision making (see chart for details).  patient presented to the emergency department today because of concerns for lightheadedness and palpitations. differentials would include cardiac disease, arrhythmias, anemia, electrolyte abnormalities, infection. Patient without fever. Blood work did show an elevation of her troponin. Concerning only EKG showed sinus bradycardia with positives. Did have concern for sick sinus syndrome. Patient was placed on pads. Discussed with Dr. Nehemiah Massed with cardiology. at this point he thinks that it could be secondary to medication. Plan will be to admit patient to the hospital hold beta blockers and reassess. ____________________________________________   FINAL CLINICAL IMPRESSION(S) / ED DIAGNOSES  Final diagnoses:  Heart palpitations  Dizziness  Sinus bradycardia     Note: This dictation was prepared with Dragon dictation. Any transcriptional errors that result from this process are unintentional     Nance Pear, MD 02/12/17  1240

## 2017-02-13 ENCOUNTER — Observation Stay (HOSPITAL_COMMUNITY)
Admit: 2017-02-13 | Discharge: 2017-02-13 | Disposition: A | Discharge status: Home / Self Care | Payer: Medicare Other | Attending: Internal Medicine | Admitting: Internal Medicine

## 2017-02-13 DIAGNOSIS — Z833 Family history of diabetes mellitus: Secondary | ICD-10-CM | POA: Diagnosis not present

## 2017-02-13 DIAGNOSIS — R7989 Other specified abnormal findings of blood chemistry: Secondary | ICD-10-CM | POA: Diagnosis present

## 2017-02-13 DIAGNOSIS — Z79891 Long term (current) use of opiate analgesic: Secondary | ICD-10-CM | POA: Diagnosis not present

## 2017-02-13 DIAGNOSIS — I248 Other forms of acute ischemic heart disease: Secondary | ICD-10-CM | POA: Diagnosis present

## 2017-02-13 DIAGNOSIS — E785 Hyperlipidemia, unspecified: Secondary | ICD-10-CM | POA: Diagnosis present

## 2017-02-13 DIAGNOSIS — R55 Syncope and collapse: Secondary | ICD-10-CM

## 2017-02-13 DIAGNOSIS — Z96651 Presence of right artificial knee joint: Secondary | ICD-10-CM | POA: Diagnosis present

## 2017-02-13 DIAGNOSIS — Z880 Allergy status to penicillin: Secondary | ICD-10-CM | POA: Diagnosis not present

## 2017-02-13 DIAGNOSIS — I481 Persistent atrial fibrillation: Secondary | ICD-10-CM | POA: Diagnosis present

## 2017-02-13 DIAGNOSIS — I10 Essential (primary) hypertension: Secondary | ICD-10-CM | POA: Diagnosis present

## 2017-02-13 DIAGNOSIS — E119 Type 2 diabetes mellitus without complications: Secondary | ICD-10-CM | POA: Diagnosis present

## 2017-02-13 DIAGNOSIS — Z87440 Personal history of urinary (tract) infections: Secondary | ICD-10-CM | POA: Diagnosis not present

## 2017-02-13 DIAGNOSIS — Z7951 Long term (current) use of inhaled steroids: Secondary | ICD-10-CM | POA: Diagnosis not present

## 2017-02-13 DIAGNOSIS — Z7901 Long term (current) use of anticoagulants: Secondary | ICD-10-CM | POA: Diagnosis not present

## 2017-02-13 DIAGNOSIS — Z888 Allergy status to other drugs, medicaments and biological substances status: Secondary | ICD-10-CM | POA: Diagnosis not present

## 2017-02-13 DIAGNOSIS — J45909 Unspecified asthma, uncomplicated: Secondary | ICD-10-CM | POA: Diagnosis present

## 2017-02-13 DIAGNOSIS — Z23 Encounter for immunization: Secondary | ICD-10-CM | POA: Diagnosis present

## 2017-02-13 DIAGNOSIS — K219 Gastro-esophageal reflux disease without esophagitis: Secondary | ICD-10-CM | POA: Diagnosis present

## 2017-02-13 DIAGNOSIS — R001 Bradycardia, unspecified: Secondary | ICD-10-CM | POA: Diagnosis not present

## 2017-02-13 DIAGNOSIS — Z8249 Family history of ischemic heart disease and other diseases of the circulatory system: Secondary | ICD-10-CM | POA: Diagnosis not present

## 2017-02-13 DIAGNOSIS — Z79899 Other long term (current) drug therapy: Secondary | ICD-10-CM | POA: Diagnosis not present

## 2017-02-13 DIAGNOSIS — Z7984 Long term (current) use of oral hypoglycemic drugs: Secondary | ICD-10-CM | POA: Diagnosis not present

## 2017-02-13 DIAGNOSIS — R42 Dizziness and giddiness: Secondary | ICD-10-CM | POA: Diagnosis present

## 2017-02-13 DIAGNOSIS — E7800 Pure hypercholesterolemia, unspecified: Secondary | ICD-10-CM | POA: Diagnosis present

## 2017-02-13 LAB — HEMOGLOBIN A1C
Hgb A1c MFr Bld: 8.9 % — ABNORMAL HIGH (ref 4.8–5.6)
Mean Plasma Glucose: 208.73 mg/dL

## 2017-02-13 LAB — GLUCOSE, CAPILLARY
GLUCOSE-CAPILLARY: 216 mg/dL — AB (ref 65–99)
Glucose-Capillary: 176 mg/dL — ABNORMAL HIGH (ref 65–99)
Glucose-Capillary: 198 mg/dL — ABNORMAL HIGH (ref 65–99)
Glucose-Capillary: 177 mg/dL — ABNORMAL HIGH (ref 65–99)

## 2017-02-13 LAB — TSH: TSH: 0.646 u[IU]/mL (ref 0.350–4.500)

## 2017-02-13 LAB — TROPONIN I: TROPONIN I: 0.07 ng/mL — AB (ref <0.03)

## 2017-02-13 MED ORDER — FLECAINIDE ACETATE 50 MG PO TABS
50.0000 mg | ORAL_TABLET | Freq: Two times a day (BID) | ORAL | Status: DC
  Administered 2017-02-13 – 2017-02-15 (×4): 50 mg via ORAL
  Filled 2017-02-13 (×5): qty 1

## 2017-02-13 MED ORDER — RIVAROXABAN 20 MG PO TABS
20.0000 mg | ORAL_TABLET | Freq: Every day | ORAL | Status: DC
  Administered 2017-02-13 – 2017-02-15 (×3): 20 mg via ORAL
  Filled 2017-02-13 (×3): qty 1

## 2017-02-13 NOTE — Progress Notes (Signed)
*  PRELIMINARY RESULTS* Echocardiogram 2D Echocardiogram has been performed.  Mallory Arellano 02/13/2017, 9:57 AM

## 2017-02-13 NOTE — Progress Notes (Signed)
[facility] for rivaroxaban Indication: atrial fibrillation  Allergies  Allergen Reactions  . Warfarin Sodium Other (See Comments)    Patient didn't know what happened but states she couldn't take it  . Penicillins Itching    Has patient had a PCN reaction causing immediate rash, facial/tongue/throat swelling, SOB or lightheadedness with hypotension: No Has patient had a PCN reaction causing severe rash involving mucus membranes or skin necrosis: No Has patient had a PCN reaction that required hospitalization: No Has patient had a PCN reaction occurring within the last 10 years: No If all of the above answers are "NO", then may proceed with Cephalosporin use.     Patient Measurements: Height: 5\' 6"  (167.6 cm) Weight: 200 lb (90.7 kg) IBW/kg (Calculated) : 59.3   Vital Signs: Temp: 97.6 F (36.4 C) (09/22 0830) Temp Source: Oral (09/22 0830) BP: 132/86 (09/22 0413) Pulse Rate: 48 (09/22 0830)  Labs:  Recent Labs  02/12/17 1104 02/12/17 1519 02/12/17 2055 02/13/17 0230  HGB 14.8  --   --   --   HCT 43.8  --   --   --   PLT 291  --   --   --   CREATININE 1.09*  --   --   --   TROPONINI 0.06* 0.06* 0.06* 0.07*    Estimated Creatinine Clearance: 49.8 mL/min (A) (by C-G formula based on SCr of 1.09 mg/dL (H)).  Actual body weight CrCl= 63 ml/min  Medical History: Past Medical History:  Diagnosis Date  . A-fib ([place])   . Asthma   . Diabetes mellitus without complication ([place])   . Hypercholesteremia   . Hypertension   . Palpitation   . UTI (urinary tract infection)     Assessment: 76 y/o F with a h/o PAF holding Xarelto initially due to possible PPM placement. Cardiology wants to resume Xarelto today. Last dose of Xarelto was 9/21.   Plan:  Continue Xarelto 20 mg daily as CrCl based on actual body weight is > 50 ml/min .   Ulice Dash D 02/13/2017,12:13 PM

## 2017-02-13 NOTE — Progress Notes (Signed)
Progress Note  Patient Name: Mallory Arellano Date of Encounter: 02/13/2017  Primary Cardiologist: Dr. Clayborn Bigness  Subjective   Bradycardia improved with a heart rate now in the 36s. She continues to complain of fatigue. No chest pain or shortness of breath.  Inpatient Medications    Scheduled Meds: . celecoxib  200 mg Oral Daily  . flecainide  50 mg Oral Q12H  . fluticasone  2 spray Each Nare Daily  . hydrochlorothiazide  25 mg Oral Daily  . Influenza vac split quadrivalent PF  0.5 mL Intramuscular Tomorrow-1000  . insulin aspart  0-5 Units Subcutaneous QHS  . insulin aspart  0-9 Units Subcutaneous TID WC  . mometasone-formoterol  2 puff Inhalation BID  . pantoprazole  40 mg Oral BID  . rivaroxaban  20 mg Oral Q supper   Continuous Infusions:  PRN Meds: acetaminophen **OR** acetaminophen, albuterol, atropine, furosemide, hydrALAZINE, montelukast, ondansetron **OR** ondansetron (ZOFRAN) IV, polyethylene glycol   Vital Signs    Vitals:   02/12/17 1448 02/12/17 2017 02/13/17 0413 02/13/17 0830  BP: (!) 146/83 (!) 147/65 132/86   Pulse: (!) 50 (!) 50 (!) 56 (!) 48  Resp: 18 18 18 14   Temp: 97.9 F (36.6 C) 98.9 F (37.2 C) 98.2 F (36.8 C) 97.6 F (36.4 C)  TempSrc:  Oral Oral Oral  SpO2: 99% 94% 100% 99%  Weight:      Height:        Intake/Output Summary (Last 24 hours) at 02/13/17 1125 Last data filed at 02/13/17 1040  Gross per 24 hour  Intake              480 ml  Output              750 ml  Net             -270 ml   Filed Weights   02/12/17 1047  Weight: 200 lb (90.7 kg)    Telemetry    Sinus bradycardia with heart rate in the 50s. - Personally Reviewed  ECG    Not performed today - Personally Reviewed  Physical Exam   GEN: No acute distress.   Neck: No JVD Cardiac: RRR, no murmurs, rubs, or gallops.  Respiratory: Clear to auscultation bilaterally. GI: Soft, nontender, non-distended  MS: No edema; No deformity. Neuro:  Nonfocal  Psych:  Normal affect   Labs    Chemistry Recent Labs Lab 02/12/17 1104  NA 137  K 4.2  CL 100*  CO2 29  GLUCOSE 247*  BUN 22*  CREATININE 1.09*  CALCIUM 9.3  PROT 7.3  ALBUMIN 4.2  AST 17  ALT 17  ALKPHOS 52  BILITOT 1.1  GFRNONAA 48*  GFRAA 56*  ANIONGAP 8     Hematology Recent Labs Lab 02/12/17 1104  WBC 16.6*  RBC 5.04  HGB 14.8  HCT 43.8  MCV 86.8  MCH 29.4  MCHC 33.8  RDW 13.5  PLT 291    Cardiac Enzymes Recent Labs Lab 02/12/17 1104 02/12/17 1519 02/12/17 2055 02/13/17 0230  TROPONINI 0.06* 0.06* 0.06* 0.07*   No results for input(s): TROPIPOC in the last 168 hours.   BNPNo results for input(s): BNP, PROBNP in the last 168 hours.   DDimer No results for input(s): DDIMER in the last 168 hours.   Radiology    No results found.  Cardiac Studies   Echocardiogram is pending.  Patient Profile     76 y.o. female with past medical history of persistent  atrial fibrillation maintained in sinus rhythm with flecainide and Toprol who presented with presyncope due to symptomatic bradycardia.  Assessment & Plan    1. Symptomatic bradycardia: Improved after holding Toprol and flecainide. Going to resume flecainide at a smaller dose of 50 mg twice daily in order to try to prevent future episodes of atrial fibrillation. Continue to hold metoprolol for now. TSH was normal. Continue to monitor for at least another day. I'm going to resume Xarelto today.  2. Persistent atrial fibrillation: Currently in sinus rhythm. If the patient's develops recurrent atrial fibrillation, she might require a permanent pacemaker placement in order to effectively treat her atrial fibrillation without the fear of significant bradycardia.  3. Mildly elevated troponin likely demand ischemia. No symptoms suggestive of angina. Continue to monitor.  For questions or updates, please contact [place] Please consult [email or web address] for contact info under Cardiology/STEMI.        Signed, Kathlyn Sacramento, MD  02/13/2017, 11:25 AM

## 2017-02-13 NOTE — Progress Notes (Signed)
Mallory Arellano at [place] NAME: Mallory Arellano    MR#:  [id number]  DATE OF BIRTH:  28-Jul-1940  SUBJECTIVE:  CHIEF COMPLAINT:   Chief Complaint  Patient presents with  . Palpitations    Came with dizziness and bradycardia, metoprolol and flecainide is stopped, feeling better, in sinus rhythm Rate in 50. REVIEW OF SYSTEMS:  CONSTITUTIONAL: No fever, fatigue or weakness.  EYES: No blurred or double vision.  EARS, NOSE, AND THROAT: No tinnitus or ear pain.  RESPIRATORY: No cough, shortness of breath, wheezing or hemoptysis.  CARDIOVASCULAR: No chest pain, orthopnea, edema.  GASTROINTESTINAL: No nausea, vomiting, diarrhea or abdominal pain.  GENITOURINARY: No dysuria, hematuria.  ENDOCRINE: No polyuria, nocturia,  HEMATOLOGY: No anemia, easy bruising or bleeding SKIN: No rash or lesion. MUSCULOSKELETAL: No joint pain or arthritis.   NEUROLOGIC: No tingling, numbness, weakness.  PSYCHIATRY: No anxiety or depression.   ROS  DRUG ALLERGIES:   Allergies  Allergen Reactions  . Warfarin Sodium Other (See Comments)    Patient didn't know what happened but states she couldn't take it  . Penicillins Itching    Has patient had a PCN reaction causing immediate rash, facial/tongue/throat swelling, SOB or lightheadedness with hypotension: No Has patient had a PCN reaction causing severe rash involving mucus membranes or skin necrosis: No Has patient had a PCN reaction that required hospitalization: No Has patient had a PCN reaction occurring within the last 10 years: No If all of the above answers are "NO", then may proceed with Cephalosporin use.     VITALS:  Blood pressure (!) 156/60, pulse (!) 49, temperature 98.6 F (37 C), temperature source Oral, resp. rate 14, height 5\' 6"  (1.676 m), weight 90.7 kg (200 lb), SpO2 97 %.  PHYSICAL EXAMINATION:  GENERAL:  76 y.o.-year-old patient lying in the bed with no acute distress.  EYES: Pupils equal,  round, reactive to light and accommodation. No scleral icterus. Extraocular muscles intact.  HEENT: Head atraumatic, normocephalic. Oropharynx and nasopharynx clear.  NECK:  Supple, no jugular venous distention. No thyroid enlargement, no tenderness.  LUNGS: Normal breath sounds bilaterally, no wheezing, rales,rhonchi or crepitation. No use of accessory muscles of respiration.  CARDIOVASCULAR: S1, S2 normal. No murmurs, rubs, or gallops.  ABDOMEN: Soft, nontender, nondistended. Bowel sounds present. No organomegaly or mass.  EXTREMITIES: No pedal edema, cyanosis, or clubbing.  NEUROLOGIC: Cranial nerves II through XII are intact. Muscle strength 5/5 in all extremities. Sensation intact. Gait not checked.  PSYCHIATRIC: The patient is alert and oriented x 3.  SKIN: No obvious rash, lesion, or ulcer.   Physical Exam LABORATORY PANEL:   CBC  Recent Labs Lab 02/12/17 1104  WBC 16.6*  HGB 14.8  HCT 43.8  PLT 291   ------------------------------------------------------------------------------------------------------------------  Chemistries   Recent Labs Lab 02/12/17 1104  NA 137  K 4.2  CL 100*  CO2 29  GLUCOSE 247*  BUN 22*  CREATININE 1.09*  CALCIUM 9.3  AST 17  ALT 17  ALKPHOS 52  BILITOT 1.1   ------------------------------------------------------------------------------------------------------------------  Cardiac Enzymes  Recent Labs Lab 02/12/17 2055 02/13/17 0230  TROPONINI 0.06* 0.07*   ------------------------------------------------------------------------------------------------------------------  RADIOLOGY:  No results found.  ASSESSMENT AND PLAN:   Active Problems:   Symptomatic bradycardia  # symptomatic bradycardia with 4 second pauses  monitor on telemetry Discontinue Toprol, flecainide and Xarelto Atropine at bedside and pacer pads at bedside Patient had one dose of atropine pushed in the ED and currently heart rate  is at around 51 Dr.  Fletcher Anon saw her today, resume xarelto and small dose of flecainide to prevent A fib, monitor on tele today.  #Chronic history of atrial fibrillation   currently in sinus bradycardia Holding Toprol, resume Xarelto today.  #Diabetes mellitus  Sliding scale insulin HBA1c is high- may need meds on d/c. Not on any home medications, diet controlled- not well controlled.  #Essential hypertension Continue hydrochlorothiazide and holding Toprol XL Will give hydralazine as needed basis  #GERD continue Protonix  DVT prophylaxis with SCDs,, on xarelto now.   All the records are reviewed and case discussed with Care Management/Social Workerr. Management plans discussed with the patient, family and they are in agreement.  CODE STATUS: full.  TOTAL TIME TAKING CARE OF THIS PATIENT: 35 minutes.     POSSIBLE D/C IN 1 DAYS, DEPENDING ON CLINICAL CONDITION.   Vaughan Basta M.D on 02/13/2017   Between 7am to 6pm - Pager - [phone]  After 6pm go to [email or web address] - password [email or web address] [facility]  Office  [phone]  CC: Primary care physician; System, Pcp Not In  Note: This dictation was prepared with Dragon dictation along with smaller phrase technology. Any transcriptional errors that result from this process are unintentional.

## 2017-02-14 ENCOUNTER — Inpatient Hospital Stay: Payer: Medicare Other

## 2017-02-14 LAB — URINALYSIS, COMPLETE (UACMP) WITH MICROSCOPIC
BILIRUBIN URINE: NEGATIVE
Glucose, UA: NEGATIVE mg/dL
HGB URINE DIPSTICK: NEGATIVE
Ketones, ur: NEGATIVE mg/dL
Leukocytes, UA: NEGATIVE
NITRITE: NEGATIVE
PROTEIN: NEGATIVE mg/dL
RBC / HPF: NONE SEEN RBC/hpf (ref 0–5)
Specific Gravity, Urine: 1.005 (ref 1.005–1.030)
pH: 7 (ref 5.0–8.0)

## 2017-02-14 LAB — ECHOCARDIOGRAM COMPLETE
Height: 66 in
WEIGHTICAEL: 3200 [oz_av]

## 2017-02-14 LAB — BASIC METABOLIC PANEL
ANION GAP: 7 (ref 5–15)
BUN: 19 mg/dL (ref 6–20)
CALCIUM: 8.8 mg/dL — AB (ref 8.9–10.3)
CO2: 28 mmol/L (ref 22–32)
Chloride: 104 mmol/L (ref 101–111)
Creatinine, Ser: 0.91 mg/dL (ref 0.44–1.00)
GFR, EST NON AFRICAN AMERICAN: 60 mL/min — AB (ref >60)
GLUCOSE: 192 mg/dL — AB (ref 65–99)
Potassium: 4 mmol/L (ref 3.5–5.1)
Sodium: 139 mmol/L (ref 135–145)

## 2017-02-14 LAB — CBC
HCT: 37.5 % (ref 35.0–47.0)
Hemoglobin: 12.9 g/dL (ref 12.0–16.0)
MCH: 29.4 pg (ref 26.0–34.0)
MCHC: 34.4 g/dL (ref 32.0–36.0)
MCV: 85.4 fL (ref 80.0–100.0)
PLATELETS: 225 10*3/uL (ref 150–440)
RBC: 4.4 MIL/uL (ref 3.80–5.20)
RDW: 13.7 % (ref 11.5–14.5)
WBC: 10.1 10*3/uL (ref 3.6–11.0)

## 2017-02-14 LAB — GLUCOSE, CAPILLARY
GLUCOSE-CAPILLARY: 177 mg/dL — AB (ref 65–99)
Glucose-Capillary: 164 mg/dL — ABNORMAL HIGH (ref 65–99)
Glucose-Capillary: 189 mg/dL — ABNORMAL HIGH (ref 65–99)
Glucose-Capillary: 183 mg/dL — ABNORMAL HIGH (ref 65–99)

## 2017-02-14 MED ORDER — GLUCERNA SHAKE PO LIQD
237.0000 mL | Freq: Three times a day (TID) | ORAL | Status: DC
  Administered 2017-02-14 – 2017-02-15 (×3): 237 mL via ORAL

## 2017-02-14 MED ORDER — METOPROLOL SUCCINATE ER 25 MG PO TB24
12.5000 mg | ORAL_TABLET | Freq: Every day | ORAL | Status: DC
  Administered 2017-02-15: 12.5 mg via ORAL
  Filled 2017-02-14: qty 1

## 2017-02-14 MED ORDER — AMLODIPINE BESYLATE 10 MG PO TABS
10.0000 mg | ORAL_TABLET | Freq: Every day | ORAL | Status: DC
  Administered 2017-02-14 – 2017-02-15 (×2): 10 mg via ORAL
  Filled 2017-02-14 (×2): qty 1

## 2017-02-14 NOTE — Progress Notes (Signed)
Pt's daughter is very concerned about the results of mother's echo. Would like results read today.

## 2017-02-14 NOTE — Progress Notes (Signed)
Progress Note  Patient Name: Mallory Arellano Date of Encounter: 02/14/2017  Primary Cardiologist: Dr. Clayborn Bigness  Subjective   Bradycardia improved from before with heart rate consistently above 50. I resumed flecainide 50 mg twice daily last night. She continues to complain of heaviness in her head with no chest pain or shortness of breath.   Inpatient Medications    Scheduled Meds: . amLODipine  10 mg Oral Daily  . celecoxib  200 mg Oral Daily  . flecainide  50 mg Oral Q12H  . fluticasone  2 spray Each Nare Daily  . hydrochlorothiazide  25 mg Oral Daily  . insulin aspart  0-5 Units Subcutaneous QHS  . insulin aspart  0-9 Units Subcutaneous TID WC  . mometasone-formoterol  2 puff Inhalation BID  . pantoprazole  40 mg Oral BID  . rivaroxaban  20 mg Oral Q supper   Continuous Infusions:  PRN Meds: acetaminophen **OR** acetaminophen, albuterol, atropine, furosemide, hydrALAZINE, montelukast, ondansetron **OR** ondansetron (ZOFRAN) IV, polyethylene glycol   Vital Signs    Vitals:   02/13/17 2209 02/14/17 0517 02/14/17 0748 02/14/17 1139  BP: (!) 162/76 (!) 163/69 (!) 151/71 (!) 158/86  Pulse: (!) 54 (!) 56 (!) 53 62  Resp: 18 18 14 14   Temp: 98.3 F (36.8 C) 98 F (36.7 C) 98 F (36.7 C) 98.2 F (36.8 C)  TempSrc: Oral Oral Oral Oral  SpO2: 97% 95% 100% 99%  Weight:      Height:        Intake/Output Summary (Last 24 hours) at 02/14/17 1208 Last data filed at 02/14/17 1024  Gross per 24 hour  Intake              720 ml  Output              750 ml  Net              -30 ml   Filed Weights   02/12/17 1047  Weight: 200 lb (90.7 kg)    Telemetry    Sinus bradycardia with heart rate in the High 50s and low 60s. - Personally Reviewed  ECG    Not performed today   Physical Exam   GEN: No acute distress.   Neck: No JVD Cardiac: RRR, no murmurs, rubs, or gallops.  Respiratory: Clear to auscultation bilaterally. GI: Soft, nontender, non-distended  MS: No  edema; No deformity. Neuro:  Nonfocal  Psych: Normal affect   Labs    Chemistry  Recent Labs Lab 02/12/17 1104 02/14/17 0458  NA 137 139  K 4.2 4.0  CL 100* 104  CO2 29 28  GLUCOSE 247* 192*  BUN 22* 19  CREATININE 1.09* 0.91  CALCIUM 9.3 8.8*  PROT 7.3  --   ALBUMIN 4.2  --   AST 17  --   ALT 17  --   ALKPHOS 52  --   BILITOT 1.1  --   GFRNONAA 48* 60*  GFRAA 56* >60  ANIONGAP 8 7     Hematology  Recent Labs Lab 02/12/17 1104 02/14/17 0458  WBC 16.6* 10.1  RBC 5.04 4.40  HGB 14.8 12.9  HCT 43.8 37.5  MCV 86.8 85.4  MCH 29.4 29.4  MCHC 33.8 34.4  RDW 13.5 13.7  PLT 291 225    Cardiac Enzymes  Recent Labs Lab 02/12/17 1104 02/12/17 1519 02/12/17 2055 02/13/17 0230  TROPONINI 0.06* 0.06* 0.06* 0.07*   No results for input(s): TROPIPOC in the last 168 hours.  BNPNo results for input(s): BNP, PROBNP in the last 168 hours.   DDimer No results for input(s): DDIMER in the last 168 hours.   Radiology    Dg Chest 2 View  Result Date: 02/14/2017 CLINICAL DATA:  Elevated white blood cell count EXAM: CHEST  2 VIEW COMPARISON:  07/06/2015 FINDINGS: Cardiac shadow remains enlarged. The lungs are well aerated bilaterally. No focal infiltrate or sizable effusion is seen. No bony abnormality is noted. IMPRESSION: No active cardiopulmonary disease. Electronically Signed   By: Inez Catalina M.D.   On: 02/14/2017 11:06    Cardiac Studies   Echocardiogram Was personally reviewed which showed normal LV systolic function, moderate left ventricular hypertrophy and no significant valvular abnormalities.  Patient Profile     76 y.o. female with past medical history of persistent atrial fibrillation maintained in sinus rhythm with flecainide and Toprol who presented with presyncope due to symptomatic bradycardia.  Assessment & Plan    1. Symptomatic bradycardia: Improved after holding Toprol and flecainide. I resumed flecainide 50 mg twice daily. Once heart rate  is consistently above 60, I recommend resuming Toprol at the lower dose of 25 mg once daily. TSH was normal. Continue Xarelto for anticoagulation.  2. Persistent atrial fibrillation: Currently in sinus rhythm. If the patient's develops recurrent atrial fibrillation, she might require a permanent pacemaker placement in order to effectively treat her atrial fibrillation without the fear of significant bradycardia.  3. Mildly elevated troponin likely demand ischemia. No symptoms suggestive of angina. I reviewed her previous cardiac records and she had a nuclear stress test done in January of this year which was normal.  Further cardiac care can be resumed by Dr. Clayborn Bigness tomorrow.  For questions or updates, please contact [place] Please consult [email or web address] for contact info under Cardiology/STEMI.      Signed, Kathlyn Sacramento, MD  02/14/2017, 12:08 PM

## 2017-02-14 NOTE — Progress Notes (Signed)
Pt resting comfortably w/ family at bedside. Daughter updated on pt. Status.

## 2017-02-14 NOTE — Progress Notes (Signed)
Pt drinks Glucerna at home, and would like to have it ordered for her here.

## 2017-02-14 NOTE — Progress Notes (Signed)
[facility] [place] NAME: Mallory Arellano    MR#:  [id number]  DATE OF BIRTH:  [birth year]  SUBJECTIVE:  CHIEF COMPLAINT:   Chief Complaint  Patient presents with  . Palpitations    Came with dizziness and bradycardia, metoprolol and flecainide is stopped, feeling better, in sinus rhythm Rate in 50. Still c/o feeling "Uneasy", can not describe more, but not dizzi.  REVIEW OF SYSTEMS:  CONSTITUTIONAL: No fever, fatigue or weakness.  EYES: No blurred or double vision.  EARS, NOSE, AND THROAT: No tinnitus or ear pain.  RESPIRATORY: No cough, shortness of breath, wheezing or hemoptysis.  CARDIOVASCULAR: No chest pain, orthopnea, edema.  GASTROINTESTINAL: No nausea, vomiting, diarrhea or abdominal pain.  GENITOURINARY: No dysuria, hematuria.  ENDOCRINE: No polyuria, nocturia,  HEMATOLOGY: No anemia, easy bruising or bleeding SKIN: No rash or lesion. MUSCULOSKELETAL: No joint pain or arthritis.   NEUROLOGIC: No tingling, numbness, weakness.  PSYCHIATRY: No anxiety or depression.   ROS  DRUG ALLERGIES:   Allergies  Allergen Reactions  . Warfarin Sodium Other (See Comments)    Patient didn't know what happened but states she couldn't take it  . Penicillins Itching    Has patient had a PCN reaction causing immediate rash, facial/tongue/throat swelling, SOB or lightheadedness with hypotension: No Has patient had a PCN reaction causing severe rash involving mucus membranes or skin necrosis: No Has patient had a PCN reaction that required hospitalization: No Has patient had a PCN reaction occurring within the last 10 years: No If all of the above answers are "NO", then may proceed with Cephalosporin use.     VITALS:  Blood pressure (!) 158/86, pulse 62, temperature 98.2 F (36.8 C), temperature source Oral, resp. rate 14, height 5\' 6"  (1.676 m), weight 90.7 kg (200 lb), SpO2 99 %.  PHYSICAL EXAMINATION:  GENERAL:  76 y.o.-year-old patient  lying in the bed with no acute distress.  EYES: Pupils equal, round, reactive to light and accommodation. No scleral icterus. Extraocular muscles intact.  HEENT: Head atraumatic, normocephalic. Oropharynx and nasopharynx clear.  NECK:  Supple, no jugular venous distention. No thyroid enlargement, no tenderness.  LUNGS: Normal breath sounds bilaterally, no wheezing, rales,rhonchi or crepitation. No use of accessory muscles of respiration.  CARDIOVASCULAR: S1, S2 normal. No murmurs, rubs, or gallops.  ABDOMEN: Soft, nontender, nondistended. Bowel sounds present. No organomegaly or mass.  EXTREMITIES: No pedal edema, cyanosis, or clubbing.  NEUROLOGIC: Cranial nerves II through XII are intact. Muscle strength 5/5 in all extremities. Sensation intact. Gait not checked.  PSYCHIATRIC: The patient is alert and oriented x 3.  SKIN: No obvious rash, lesion, or ulcer.   Physical Exam LABORATORY PANEL:   CBC  Recent Labs Lab 02/14/17 0458  WBC 10.1  HGB 12.9  HCT 37.5  PLT 225   ------------------------------------------------------------------------------------------------------------------  Chemistries   Recent Labs Lab 02/12/17 1104 02/14/17 0458  NA 137 139  K 4.2 4.0  CL 100* 104  CO2 29 28  GLUCOSE 247* 192*  BUN 22* 19  CREATININE 1.09* 0.91  CALCIUM 9.3 8.8*  AST 17  --   ALT 17  --   ALKPHOS 52  --   BILITOT 1.1  --    ------------------------------------------------------------------------------------------------------------------  Cardiac Enzymes  Recent Labs Lab 02/12/17 2055 02/13/17 0230  TROPONINI 0.06* 0.07*   ------------------------------------------------------------------------------------------------------------------  RADIOLOGY:  Dg Chest 2 View  Result Date: 02/14/2017 CLINICAL DATA:  Elevated white blood cell count EXAM: CHEST  2 VIEW  COMPARISON:  07/06/2015 FINDINGS: Cardiac shadow remains enlarged. The lungs are well aerated bilaterally. No  focal infiltrate or sizable effusion is seen. No bony abnormality is noted. IMPRESSION: No active cardiopulmonary disease. Electronically Signed   By: Inez Catalina M.D.   On: 02/14/2017 11:06    ASSESSMENT AND PLAN:   Active Problems:   Symptomatic bradycardia  # symptomatic bradycardia with 4 second pauses  monitor on telemetry Discontinue Toprol, flecainide and Xarelto Atropine at bedside and pacer pads at bedside Patient had one dose of atropine pushed in the ED  Dr. Fletcher Anon saw her today, resume xarelto and small dose of flecainide to prevent A fib, monitor on tele, remains in sinus rhythm in 50's. Stress test was negative this year, so no further work up advised. Suggest to start low dose of Metoprolol and monitor, if heart rate Is stable. UA and xray chest are negative, no clear signs of infection.  #Chronic history of atrial fibrillation   currently in sinus bradycardia  resume Xarelto and low dose metoprolol and flecainide.  #Diabetes mellitus  Sliding scale insulin HBA1c is high- may need meds on d/c. Not on any home medications, diet controlled- not well controlled.  #Essential hypertension Continue hydrochlorothiazide , added amlodipine and low dose metoprolol. Will give hydralazine as needed basis  #GERD continue Protonix  DVT prophylaxis with SCDs,, on xarelto now.   All the records are reviewed and case discussed with Care Management/Social Workerr. Management plans discussed with the patient, family and they are in agreement.  CODE STATUS: full.  TOTAL TIME TAKING CARE OF THIS PATIENT: 35 minutes.     POSSIBLE D/C IN 1 DAYS, DEPENDING ON CLINICAL CONDITION.   Vaughan Basta M.D on 02/14/2017   Between 7am to 6pm - Pager - [phone]  After 6pm go to [email or web address] - password [email or web address] [facility]  Office  [phone]  CC: Primary care physician; System, Pcp Not In  Note: This dictation was prepared with Dragon  dictation along with smaller phrase technology. Any transcriptional errors that result from this process are unintentional.

## 2017-02-15 LAB — GLUCOSE, CAPILLARY
GLUCOSE-CAPILLARY: 200 mg/dL — AB (ref 65–99)
Glucose-Capillary: 158 mg/dL — ABNORMAL HIGH (ref 65–99)
Glucose-Capillary: 154 mg/dL — ABNORMAL HIGH (ref 65–99)

## 2017-02-15 MED ORDER — METOPROLOL SUCCINATE ER 25 MG PO TB24: 25.0000 mg | ORAL_TABLET | Freq: Every day | ORAL | 0 refills | Status: DC

## 2017-02-15 MED ORDER — GUAIFENESIN-DM 100-10 MG/5ML PO SYRP: 5.0000 mL | ORAL_SOLUTION | ORAL | Status: DC | PRN

## 2017-02-15 MED ORDER — AMLODIPINE BESYLATE 5 MG PO TABS: 5.0000 mg | ORAL_TABLET | Freq: Every day | ORAL | 0 refills | Status: DC

## 2017-02-15 MED ORDER — METFORMIN HCL 500 MG PO TABS: 500.0000 mg | ORAL_TABLET | Freq: Two times a day (BID) | ORAL | 0 refills | Status: DC

## 2017-02-15 MED ORDER — FLECAINIDE ACETATE 50 MG PO TABS: 50.0000 mg | ORAL_TABLET | Freq: Two times a day (BID) | ORAL | 0 refills | Status: DC

## 2017-02-15 MED ORDER — GLIMEPIRIDE 2 MG PO TABS: 2.0000 mg | ORAL_TABLET | ORAL | 0 refills | Status: DC

## 2017-02-15 NOTE — Care Management (Signed)
Members of care team relay there ar no discharge concerns

## 2017-02-15 NOTE — Discharge Instructions (Signed)
Follow with PMD in 2 weeks for management of diabetes.

## 2017-02-15 NOTE — Discharge Summary (Addendum)
[facility] at [place] NAME: Mallory Arellano    MR#:  [id number]  DATE OF BIRTH:  Sep 25, 1940  DATE OF ADMISSION:  02/12/2017 ADMITTING PHYSICIAN: Nicholes Mango, MD  DATE OF DISCHARGE: 02/15/2017  PRIMARY CARE PHYSICIAN: System, Pcp Not In    ADMISSION DIAGNOSIS:  Dizziness [R42] Sinus bradycardia [R00.1] Heart palpitations [R00.2]  DISCHARGE DIAGNOSIS:  Active Problems:   Symptomatic bradycardia   diabetes  SECONDARY DIAGNOSIS:   Past Medical History:  Diagnosis Date  . A-fib ([place])   . Asthma   . Diabetes mellitus without complication ([place])   . Hypercholesteremia   . Hypertension   . Palpitation   . UTI (urinary tract infection)     HOSPITAL COURSE:   # symptomatic bradycardia with 4 second pauses  monitor on telemetry Discontinue Toprol, flecainide and Xarelto Atropine at bedside and pacer pads at bedside Patient had one dose of atropine pushed in the ED  Dr. Fletcher Anon saw her today, resume xarelto and small dose of flecainide to prevent A fib, monitor on tele, remains in sinus rhythm in 50's. Stress test was negative this year, so no further work up advised. Suggest to start low dose of Metoprolol and monitor, if heart rate Is stable. UA and xray chest are negative, no clear signs of infection.  Pt's HR remained stable in 60's on low dose metoprolol and Flecainide.  Dr. Fletcher Anon cleared for discharge. Pt is also seen by Dr. Clayborn Bigness- pt's cardiologist, and he agreed with discharge and follow in 1-2 weeks.  #Chronic history of atrial fibrillation  presented in sinus bradycardia  resume Xarelto and low dose metoprolol and flecainide.  #Diabetes mellitus  Sliding scale insulin HBA1c is high- may need meds on d/c. Not on any home medications, diet controlled- not well controlled. started on metformin, dietary consult.  Follow with PMD in 2 weeks for adjustment with meds.   Pt refused for metformin, said" it gives her  stomach upset", so will give low dose glimiperide.  #Essential hypertension Continue hydrochlorothiazide , added amlodipine and low dose metoprolol. Will give hydralazine as needed basis  #GERD continue Protonix  DVT prophylaxis with SCDs,, on xarelto now.  DISCHARGE CONDITIONS:   Stable.  CONSULTS OBTAINED:  Treatment Team:  Corey Skains, MD Wellington Hampshire, MD  DRUG ALLERGIES:   Allergies  Allergen Reactions  . Warfarin Sodium Other (See Comments)    Patient didn't know what happened but states she couldn't take it  . Penicillins Itching    Has patient had a PCN reaction causing immediate rash, facial/tongue/throat swelling, SOB or lightheadedness with hypotension: No Has patient had a PCN reaction causing severe rash involving mucus membranes or skin necrosis: No Has patient had a PCN reaction that required hospitalization: No Has patient had a PCN reaction occurring within the last 10 years: No If all of the above answers are "NO", then may proceed with Cephalosporin use.     DISCHARGE MEDICATIONS:   Current Discharge Medication List    START taking these medications   Details  amLODipine (NORVASC) 5 MG tablet Take 1 tablet (5 mg total) by mouth daily. Qty: 30 tablet, Refills: 0    metFORMIN (GLUCOPHAGE) 500 MG tablet Take 1 tablet (500 mg total) by mouth 2 (two) times daily with a meal. Qty: 60 tablet, Refills: 0      CONTINUE these medications which have CHANGED   Details  flecainide (TAMBOCOR) 50 MG tablet Take 1 tablet (50 mg  total) by mouth every 12 (twelve) hours. Qty: 60 tablet, Refills: 0    metoprolol succinate (TOPROL-XL) 25 MG 24 hr tablet Take 1 tablet (25 mg total) by mouth daily. Qty: 30 tablet, Refills: 0      CONTINUE these medications which have NOT CHANGED   Details  albuterol (PROVENTIL HFA;VENTOLIN HFA) 108 (90 BASE) MCG/ACT inhaler Inhale 2 puffs into the lungs every 6 (six) hours as needed for wheezing or shortness of  breath.     albuterol (PROVENTIL) (2.5 MG/3ML) 0.083% nebulizer solution Take 2.5 mg by nebulization every 6 (six) hours as needed for wheezing or shortness of breath.    budesonide-formoterol (SYMBICORT) 160-4.5 MCG/ACT inhaler Inhale 2 puffs into the lungs 2 (two) times daily.    celecoxib (CELEBREX) 200 MG capsule TAKE 1 CAPSULE (200 MG TOTAL) BY MOUTH ONCE DAILY.    fluticasone (FLONASE) 50 MCG/ACT nasal spray Place 2 sprays into both nostrils daily. Qty: 16 g, Refills: 6    furosemide (LASIX) 20 MG tablet Take 1 tablet (20 mg total) by mouth daily. Qty: 90 tablet, Refills: 3    hydrochlorothiazide (HYDRODIURIL) 25 MG tablet Take 25 mg by mouth daily.    hydrOXYzine (ATARAX/VISTARIL) 25 MG tablet Take 25 mg by mouth 3 (three) times daily as needed for itching.     montelukast (SINGULAIR) 10 MG tablet Take 1 tablet (10 mg total) by mouth daily. Qty: 90 tablet, Refills: 3    pantoprazole (PROTONIX) 40 MG tablet Take 40 mg by mouth 2 (two) times daily.  Refills: 11    rivaroxaban (XARELTO) 20 MG TABS tablet Take 20 mg by mouth daily with breakfast.         DISCHARGE INSTRUCTIONS:    Follow with PMD in 2 weeks, with cardiologist in 1-2 weeks.  If you experience worsening of your admission symptoms, develop shortness of breath, life threatening emergency, suicidal or homicidal thoughts you must seek medical attention immediately by calling 911 or calling your MD immediately  if symptoms less severe.  You Must read complete instructions/literature along with all the possible adverse reactions/side effects for all the Medicines you take and that have been prescribed to you. Take any new Medicines after you have completely understood and accept all the possible adverse reactions/side effects.   Please note  You were cared for by a hospitalist during your hospital stay. If you have any questions about your discharge medications or the care you received while you were in the  hospital after you are discharged, you can call the unit and asked to speak with the hospitalist on call if the hospitalist that took care of you is not available. Once you are discharged, your primary care physician will handle any further medical issues. Please note that NO REFILLS for any discharge medications will be authorized once you are discharged, as it is imperative that you return to your primary care physician (or establish a relationship with a primary care physician if you do not have one) for your aftercare needs so that they can reassess your need for medications and monitor your lab values.    Today   CHIEF COMPLAINT:   Chief Complaint  Patient presents with  . Palpitations    HISTORY OF PRESENT ILLNESS:  Mallory Arellano  is a 76 y.o. female with a known history of Atrial fibrillation on Xarelto and Toprol-XL and flecainide, diabetes mellitus, hyperlipidemia hypertension is present into the ED with a chief complaint of lightheadedness and palpitations. Patient denies any loss  of consciousness or chest pain. Denies any shortness of breath. EKG has revealed sinus bradycardia heart rate at around 37 with 4 second pauses Patient was given atropine IV and hospitalist team is called to admit the patient. The ED physician has discussed with the on-call cardiologist Dr. Nehemiah Massed regarding the patient's situation and he has recommended to discontinue Toprol. During my exam as patient is very anxious and heart rate is at around 50s after pushing atropine   VITAL SIGNS:  Blood pressure (!) 148/130, pulse 72, temperature (!) 97.3 F (36.3 C), temperature source Oral, resp. rate 18, height 5\' 6"  (1.676 m), weight 90.7 kg (200 lb), SpO2 99 %.  I/O:   Intake/Output Summary (Last 24 hours) at 02/15/17 1356 Last data filed at 02/15/17 1000  Gross per 24 hour  Intake              360 ml  Output                0 ml  Net              360 ml    PHYSICAL EXAMINATION:  GENERAL:  76  y.o.-year-old patient lying in the bed with no acute distress.  EYES: Pupils equal, round, reactive to light and accommodation. No scleral icterus. Extraocular muscles intact.  HEENT: Head atraumatic, normocephalic. Oropharynx and nasopharynx clear.  NECK:  Supple, no jugular venous distention. No thyroid enlargement, no tenderness.  LUNGS: Normal breath sounds bilaterally, no wheezing, rales,rhonchi or crepitation. No use of accessory muscles of respiration.  CARDIOVASCULAR: S1, S2 normal. No murmurs, rubs, or gallops.  ABDOMEN: Soft, non-tender, non-distended. Bowel sounds present. No organomegaly or mass.  EXTREMITIES: No pedal edema, cyanosis, or clubbing.  NEUROLOGIC: Cranial nerves II through XII are intact. Muscle strength 5/5 in all extremities. Sensation intact. Gait not checked.  PSYCHIATRIC: The patient is alert and oriented x 3.  SKIN: No obvious rash, lesion, or ulcer.   DATA REVIEW:   CBC  Recent Labs Lab 02/14/17 0458  WBC 10.1  HGB 12.9  HCT 37.5  PLT 225    Chemistries   Recent Labs Lab 02/12/17 1104 02/14/17 0458  NA 137 139  K 4.2 4.0  CL 100* 104  CO2 29 28  GLUCOSE 247* 192*  BUN 22* 19  CREATININE 1.09* 0.91  CALCIUM 9.3 8.8*  AST 17  --   ALT 17  --   ALKPHOS 52  --   BILITOT 1.1  --     Cardiac Enzymes  Recent Labs Lab 02/13/17 0230  TROPONINI 0.07*    Microbiology Results  No results found for this or any previous visit.  RADIOLOGY:  Dg Chest 2 View  Result Date: 02/14/2017 CLINICAL DATA:  Elevated white blood cell count EXAM: CHEST  2 VIEW COMPARISON:  07/06/2015 FINDINGS: Cardiac shadow remains enlarged. The lungs are well aerated bilaterally. No focal infiltrate or sizable effusion is seen. No bony abnormality is noted. IMPRESSION: No active cardiopulmonary disease. Electronically Signed   By: Inez Catalina M.D.   On: 02/14/2017 11:06    EKG:   Orders placed or performed during the hospital encounter of 02/12/17  . EKG  12-Lead  . EKG 12-Lead      Management plans discussed with the patient, family and they are in agreement.  CODE STATUS:     Code Status Orders        Start     Ordered   02/12/17 1446  Full code  Continuous     02/12/17 1445    Code Status History    Date Active Date Inactive Code Status Order ID Comments User Context   12/29/2014  7:57 PM 12/31/2014  6:40 PM Full Code [id number]  Lucious Groves, DO Inpatient      TOTAL TIME TAKING CARE OF THIS PATIENT:35 minutes.    Vaughan Basta M.D on 02/15/2017 at 1:56 PM  Between 7am to 6pm - Pager - [phone]  After 6pm go to [email or web address] - password [email or web address] [facility]  Office  [phone]  CC: Primary care physician; System, Pcp Not In   Note: This dictation was prepared with Dragon dictation along with smaller phrase technology. Any transcriptional errors that result from this process are unintentional.

## 2017-02-15 NOTE — Progress Notes (Signed)
Nutrition Education Note   RD consulted for nutrition education regarding diabetes.   Lab Results  Component Value Date   HGBA1C 8.9 (H) 02/13/2017    RD provided "Nutrition and Type II Diabetes" handout from the Academy of [employer]etics. Discussed different food groups and their effects on blood sugar, emphasizing carbohydrate-containing foods. Provided list of carbohydrates and recommended serving sizes of common foods.  Discussed importance of controlled and consistent carbohydrate intake throughout the day. Provided examples of ways to balance meals/snacks and encouraged intake of high-fiber, whole grain complex carbohydrates. Teach back method used.  Expect good compliance.  Body mass index is 32.28 kg/m. Pt meets criteria for obese based on current BMI.  Current diet order is 2g sodium , patient is consuming approximately 100% of meals at this time. Labs and medications reviewed. No further nutrition interventions warranted at this time. RD contact information provided. If additional nutrition issues arise, please re-consult RD.  Koleen Distance MS, RD, LDN Pager #- [phone] After Hours Pager: [phone]

## 2017-02-15 NOTE — Care Management Important Message (Signed)
Important Message  Patient Details  Name: Mallory Arellano MRN: [id number] Date of Birth: 07-24-1940   Medicare Important Message Given:  Yes Signed IM notice given    Katrina Stack, RN 02/15/2017, 1:25 PM

## 2017-02-15 NOTE — Progress Notes (Addendum)
Met with patient earlier this afternoon.  Patient stated she was told she had "pre-diabetes" before but did not realize she had full blown diabetes.  Stated she took Metformin in the past, however, this medicine gave her severe bloating and that she could not tolerate this medicine.  Discussed Amaryl with patient and patient agreeable to trying Amaryl.  Spoke with pt about new diagnosis.  Discussed A1C results with her and explained what an A1C is, basic pathophysiology of DM Type 2, basic home care, basic diabetes diet nutrition principles, importance of checking CBGs and maintaining good CBG control to prevent long-term and short-term complications.  Reviewed signs and symptoms of hyperglycemia and hypoglycemia and how to treat hypoglycemia at home.  Also reviewed blood sugar goals and A1c goals for home.    Gave patient information on purchasing an inexpensive CBG meter and strips at [employer].  Meter is $9 and a box of 50 strips is $9.  Encouraged pt to check her CBGs at least 1-2 times daily and to record all CBGs for future MD visits.  RNs to provide ongoing basic DM education at bedside with this patient.  Have ordered educational booklet for patient.  Have also placed RD consult for DM diet education for this patient.  Patient asked me to please call Dr. Joycie Peek office (from pt's room) and make her an appt.  Attempted to call Dr. Joycie Peek office, but sat on hold for 10 minutes.  Gave patient the phone and asked her to please wait on the line and request an appt with Dr. Gabriel Carina when the receptionist answered.      --Will follow patient during hospitalization--  Wyn Quaker RN, MSN, CDE Diabetes Coordinator Inpatient Glycemic Control Team Team Pager: [phone] (8a-5p)

## 2017-02-15 NOTE — Progress Notes (Signed)
Inpatient Diabetes Program Recommendations  AACE/ADA: New Consensus Statement on Inpatient Glycemic Control (2015)  Target Ranges:  Prepandial:   less than 140 mg/dL      Peak postprandial:   less than 180 mg/dL (1-2 hours)      Critically ill patients:  140 - 180 mg/dL   Results for Mallory Arellano, Mallory Arellano (MRN [id number]) as of 02/15/2017 11:09  Ref. Range 02/14/2017 07:38 02/14/2017 11:38 02/14/2017 16:57 02/14/2017 21:25  Glucose-Capillary Latest Ref Range: 65 - 99 mg/dL 164 (H) 177 (H) 189 (H) 183 (H)   Results for Mallory Arellano, Mallory Arellano (MRN [id number]) as of 02/15/2017 11:09  Ref. Range 02/13/2017 02:30  Hemoglobin A1C Latest Ref Range: 4.8 - 5.6 % 8.9 (H)    Admit with: Symptomatic Bradycardia  History: DM2  Home DM Meds: None- Diet Controlled  Current Insulin Orders: Novolog Sensitive Correction Scale/ SSI (0-9 units) TID AC + HS       MD- Note patient with A1c above goal.  Patient may require Initiation of an Oral Diabetes Medication at time of discharge.  Creatinine is WNL (0.91 mg/dl) and patient may do well on Metformin for home.  Recommend Metformin 500 mg BID at time of discharge and have patient follow up with PCP.     --Will follow patient during hospitalization--  Wyn Quaker RN, MSN, CDE Diabetes Coordinator Inpatient Glycemic Control Team Team Pager: [phone] (8a-5p)

## 2017-02-23 ENCOUNTER — Encounter: Payer: Medicare Other | Attending: Internal Medicine | Admitting: Dietician

## 2017-02-23 ENCOUNTER — Encounter: Payer: Self-pay | Admitting: Dietician

## 2017-02-23 VITALS — BP 130/68 | Ht 66.0 in | Wt 185.5 lb

## 2017-02-23 DIAGNOSIS — Z713 Dietary counseling and surveillance: Secondary | ICD-10-CM | POA: Insufficient documentation

## 2017-02-23 DIAGNOSIS — E1165 Type 2 diabetes mellitus with hyperglycemia: Secondary | ICD-10-CM | POA: Diagnosis not present

## 2017-02-23 DIAGNOSIS — E119 Type 2 diabetes mellitus without complications: Secondary | ICD-10-CM

## 2017-02-23 NOTE — Progress Notes (Signed)
Diabetes Self-Management Education  Visit Type: First/Initial  Appt. Start Time:1015  Appt. End Time: 1130  02/23/2017  [name], identified by name and date of birth, is a 76 y.o. female with a diagnosis of Diabetes: Type 2.   ASSESSMENT  Blood pressure 130/68, height 5\' 6"  (1.676 m), weight 185 lb 8 oz (84.1 kg). Body mass index is 29.94 kg/m.      Diabetes Self-Management Education - 02/23/17 1134      Visit Information   Visit Type First/Initial     Initial Visit   Diabetes Type Type 2     Health Coping   How would you rate your overall health? Good     Psychosocial Assessment   Patient Belief/Attitude about Diabetes Motivated to manage diabetes   Self-care barriers None   Self-management support Doctor's office   Other persons present Patient   Patient Concerns Glycemic Control;Weight Control;Medication  prevent complications   Special Needs None   Preferred Learning Style Auditory;Visual;Hands on   Learning Readiness Ready   What is the last grade level you completed in school? BS degree     Pre-Education Assessment   Patient understands the diabetes disease and treatment process. Needs Instruction   Patient understands incorporating nutritional management into lifestyle. Needs Instruction   Patient undertands incorporating physical activity into lifestyle. Needs Instruction   Patient understands using medications safely. Needs Instruction   Patient understands monitoring blood glucose, interpreting and using results Needs Review   Patient understands prevention, detection, and treatment of acute complications. Needs Instruction   Patient understands prevention, detection, and treatment of chronic complications. Needs Instruction   Patient understands how to develop strategies to address psychosocial issues. Needs Instruction   Patient understands how to develop strategies to promote health/change behavior. Needs Instruction     Complications   Last  HgB A1C per patient/outside source 8.9 %  01-2017   How often do you check your blood sugar? 1-2 times/day   Fasting Blood glucose range (mg/dL) 130-179 ; today's FBG 146 ac supper=130's-160's   Have you had a dilated eye exam in the past 12 months? Yes  1 wk ago   Have you had a dental exam in the past 12 months? Yes  4 months ago   Are you checking your feet? No     Dietary Intake   Breakfast eats breakfast at 8a-9a   Snack (morning) eats crackers with peanut butter or salad at 10:30a   Lunch eats lunch at 12p; has decreased intake of sweets and sugar   Snack (afternoon) eats fruit or popcorn at 3p   Dinner eats supper at 6p   Snack (evening) eats fruit at 10p   Beverage(s) drinks water 8+x/day and occasional milk     Exercise   Exercise Type ADL's;Light (walking / raking leaves);Moderate (swimming / aerobic walking)   How many days per week to you exercise? 5   How many minutes per day do you exercise? 150   Total minutes per week of exercise 750     Patient Education   Previous Diabetes Education No   Disease state  Definition of diabetes, type 1 and 2, and the diagnosis of diabetes;Explored patient's options for treatment of their diabetes   Nutrition management  Role of diet in the treatment of diabetes and the relationship between the three main macronutrients and blood glucose level;Food label reading, portion sizes and measuring food.;Carbohydrate counting   Physical activity and exercise  Role of exercise on diabetes  management, blood pressure control and cardiac health.;Helped patient identify appropriate exercises in relation to his/her diabetes, diabetes complications and other health issue.   Medications Reviewed patients medication for diabetes, action, purpose, timing of dose and side effects.   Monitoring Purpose and frequency of SMBG.;Taught/discussed recording of test results and interpretation of SMBG.;Identified appropriate SMBG and/or A1C goals.;Yearly dilated eye  exam   Acute complications Taught treatment of hypoglycemia - the 15 rule.  reviewed symptoms of low BG and treatment   Chronic complications Relationship between chronic complications and blood glucose control;Lipid levels, blood glucose control and heart disease;Dental care;Retinopathy and reason for yearly dilated eye exams;Reviewed with patient heart disease, higher risk of, and prevention;Nephropathy, what it is, prevention of, the use of ACE, ARB's and early detection of through urine microalbumia.   Personal strategies to promote health Lifestyle issues that need to be addressed for better diabetes care;Helped patient develop diabetes management plan for (enter comment)     Outcomes   Expected Outcomes Demonstrated interest in learning. Expect positive outcomes      Individualized Plan for Diabetes Self-Management Training:   Learning Objective:  Patient will have a greater understanding of diabetes self-management. Patient education plan is to attend individual and/or group sessions per assessed needs and concerns.   Plan:   Patient Instructions   Check blood sugars 2 x day before breakfast and 2 hrs after supper every day and record Bring blood sugar records to the next appointment/class Exercise:  Ask MD (cardiologist) for permission to exercise Eat 3 meals day,   1  snack a day at bedtime Space meals 4-5 hours apart Eat 2-3 carbohydrate servings/meal + protein Eat 1 carbohydrate serving/snack + protein Avoid sugar sweetened drinks (soda, tea, coffee, sports drinks, juices) Limit intake of fried foods and sweets/desserts Get a Sharps container Carry fast acting glucose and a snack at all times Return for appointment/classes on: 03-15-17   Expected Outcomes:  Demonstrated interest in learning. Expect positive outcomes  Education material provided: General meal planning guidelines, 1 tube glucose tablets for PRN use  If problems or questions, patient to contact team via:   [phone]  Future DSME appointment:  03-15-17

## 2017-02-23 NOTE — Patient Instructions (Signed)
  Check blood sugars 2 x day before breakfast and 2 hrs after supper every day and record Bring blood sugar records to the next appointment/class Exercise:  Ask MD for permission to exercise Eat 3 meals day,   1  snack a day at bedtime Space meals 4-5 hours apart Eat 2-3 carbohydrate servings/meal + protein Eat 1 carbohydrate serving/snack + protein Avoid sugar sweetened drinks (soda, tea, coffee, sports drinks, juices) Limit intake of fried foods and sweets/desserts Get a Sharps container Carry fast acting glucose and a snack at all times Return for appointment/classes on:

## 2017-03-16 ENCOUNTER — Telehealth: Payer: Self-pay | Admitting: Dietician

## 2017-03-16 NOTE — Telephone Encounter (Signed)
Called patient to reschedule class series, as she missed class 1 on 03/15/17. Rescheduled to 04/05/17.  She also reports getting BG readings of 28, and 1, but denies any symptoms of hypoglycemia. Due to caring for ill sister, and helping with funeral for brother-in-law, she is unable to come to our office at this time. Advised her to call meter company for assistance. Discussed effects of her stress on BGs. She does report that she is eating regularly.  Encouraged her to call back with any other questions or concerns.

## 2017-04-05 ENCOUNTER — Encounter: Payer: Medicare Other | Attending: Internal Medicine

## 2017-04-05 DIAGNOSIS — Z713 Dietary counseling and surveillance: Secondary | ICD-10-CM | POA: Insufficient documentation

## 2017-04-05 DIAGNOSIS — E1165 Type 2 diabetes mellitus with hyperglycemia: Secondary | ICD-10-CM | POA: Insufficient documentation

## 2017-04-13 ENCOUNTER — Encounter: Payer: Self-pay | Admitting: Dietician

## 2017-04-13 NOTE — Progress Notes (Signed)
Pt did not come to diabetes class 1 on 04-05-17.  Will send discharge letter to MD as she has missed 2 appointments to begin  diabetes classes

## 2017-10-27 ENCOUNTER — Other Ambulatory Visit: Payer: Self-pay

## 2017-10-27 ENCOUNTER — Encounter
Admission: RE | Admit: 2017-10-27 | Discharge: 2017-10-27 | Disposition: A | Discharge status: Home / Self Care | Payer: Medicare Other | Source: Ambulatory Visit | Attending: Orthopedic Surgery | Admitting: Orthopedic Surgery

## 2017-10-27 DIAGNOSIS — I499 Cardiac arrhythmia, unspecified: Secondary | ICD-10-CM | POA: Insufficient documentation

## 2017-10-27 DIAGNOSIS — Z01812 Encounter for preprocedural laboratory examination: Secondary | ICD-10-CM | POA: Insufficient documentation

## 2017-10-27 DIAGNOSIS — Z01810 Encounter for preprocedural cardiovascular examination: Secondary | ICD-10-CM | POA: Diagnosis not present

## 2017-10-27 DIAGNOSIS — I10 Essential (primary) hypertension: Secondary | ICD-10-CM

## 2017-10-27 DIAGNOSIS — I119 Hypertensive heart disease without heart failure: Secondary | ICD-10-CM | POA: Insufficient documentation

## 2017-10-27 HISTORY — DX: Cardiac arrhythmia, unspecified: I49.9

## 2017-10-27 HISTORY — DX: Localized edema: R60.0

## 2017-10-27 HISTORY — DX: Gastro-esophageal reflux disease without esophagitis: K21.9

## 2017-10-27 HISTORY — DX: Unspecified osteoarthritis, unspecified site: M19.90

## 2017-10-27 LAB — CBC
HCT: 42.4 % (ref 35.0–47.0)
HEMOGLOBIN: 14.2 g/dL (ref 12.0–16.0)
MCH: 29.4 pg (ref 26.0–34.0)
MCHC: 33.4 g/dL (ref 32.0–36.0)
MCV: 88 fL (ref 80.0–100.0)
PLATELETS: 239 10*3/uL (ref 150–440)
RBC: 4.82 MIL/uL (ref 3.80–5.20)
RDW: 14.4 % (ref 11.5–14.5)
WBC: 7.8 10*3/uL (ref 3.6–11.0)

## 2017-10-27 LAB — BASIC METABOLIC PANEL
Anion gap: 8 (ref 5–15)
BUN: 25 mg/dL — AB (ref 6–20)
CHLORIDE: 102 mmol/L (ref 101–111)
CO2: 28 mmol/L (ref 22–32)
CREATININE: 0.89 mg/dL (ref 0.44–1.00)
Calcium: 8.9 mg/dL (ref 8.9–10.3)
Glucose, Bld: 151 mg/dL — ABNORMAL HIGH (ref 65–99)
POTASSIUM: 3.5 mmol/L (ref 3.5–5.1)
SODIUM: 138 mmol/L (ref 135–145)

## 2017-10-27 LAB — URINALYSIS, COMPLETE (UACMP) WITH MICROSCOPIC
BACTERIA UA: NONE SEEN
BILIRUBIN URINE: NEGATIVE
Glucose, UA: NEGATIVE mg/dL
HGB URINE DIPSTICK: NEGATIVE
Ketones, ur: NEGATIVE mg/dL
LEUKOCYTES UA: NEGATIVE
Nitrite: NEGATIVE
PROTEIN: NEGATIVE mg/dL
SPECIFIC GRAVITY, URINE: 1.008 (ref 1.005–1.030)
WBC UA: NONE SEEN WBC/hpf (ref 0–5)
pH: 5 (ref 5.0–8.0)

## 2017-10-27 LAB — PROTIME-INR
INR: 1.61
PROTHROMBIN TIME: 19 s — AB (ref 11.4–15.2)

## 2017-10-27 LAB — APTT: APTT: 37 s — AB (ref 24–36)

## 2017-10-27 LAB — TYPE AND SCREEN
ABO/RH(D): O POS
Antibody Screen: NEGATIVE

## 2017-10-27 LAB — SEDIMENTATION RATE: SED RATE: 15 mm/h (ref 0–30)

## 2017-10-27 NOTE — Patient Instructions (Addendum)
Your procedure is scheduled on: 11/11/17 Thur Report to Same Day Surgery 2nd floor medical mall [facility] Entrance-take elevator on left to 2nd floor.  Check in with surgery information desk.) To find out your arrival time please call [phone] between 1PM - 3PM on 11/10/17 Wed  Remember: Instructions that are not followed completely may result in serious medical risk, up to and including death, or upon the discretion of your surgeon and anesthesiologist your surgery may need to be rescheduled.    _x___ 1. Do not eat food after midnight the night before your procedure. You may drink clear liquids up to 2 hours before you are scheduled to arrive at the hospital for your procedure.  Do not drink clear liquids within 2 hours of your scheduled arrival to the hospital.  Clear liquids include  --Water or Apple juice without pulp  --Clear carbohydrate beverage such as ClearFast or Gatorade  --Black Coffee or Clear Tea (No milk, no creamers, do not add anything to                  the coffee or Tea Type 1 and type 2 diabetics should only drink water.  No gum chewing or hard candies.     __x__ 2. No Alcohol for 24 hours before or after surgery.   __x__3. No Smoking or e-cigarettes for 24 prior to surgery.  Do not use any chewable tobacco products for at least 6 hour prior to surgery   ____  4. Bring all medications with you on the day of surgery if instructed.    __x__ 5. Notify your doctor if there is any change in your medical condition     (cold, fever, infections).    x___6. On the morning of surgery brush your teeth with toothpaste and water.  You may rinse your mouth with mouth wash if you wish.  Do not swallow any toothpaste or mouthwash.   Do not wear jewelry, make-up, hairpins, clips or nail polish.  Do not wear lotions, powders, or perfumes. You may wear deodorant.  Do not shave 48 hours prior to surgery. Men may shave face and neck.  Do not bring valuables to the hospital.     [facility] is not responsible for any belongings or valuables.               Contacts, dentures or bridgework may not be worn into surgery.  Leave your suitcase in the car. After surgery it may be brought to your room.  For patients admitted to the hospital, discharge time is determined by your                       treatment team.  _  Patients discharged the day of surgery will not be allowed to drive home.  You will need someone to drive you home and stay with you the night of your procedure.    Please read over the following fact sheets that you were given:   [facility] Preparing for Surgery and or MRSA Information   _x___ Take anti-hypertensive listed below, cardiac, seizure, asthma,     anti-reflux and psychiatric medicines. These include:  1. budesonide-formoterol (SYMBICORT) 160-4.5 MCG/ACT inhaler  2.flecainide (TAMBOCOR) 50 MG tablet  3.HYDROcodone-acetaminophen (NORCO/VICODIN) 5-325 MG tablet if needed  4.metoprolol succinate (TOPROL-XL) 50 MG 24 hr tablet  5.pantoprazole (PROTONIX) 40 MG tablet  6.traMADol (ULTRAM) 50 MG tablet if needed  7. albuterol (PROVENTIL  If needed  ____Fleets  enema or Magnesium Citrate as directed.   _x___ Use CHG Soap or sage wipes as directed on instruction sheet   ____ Use inhalers on the day of surgery and bring to hospital day of surgery  ____ Stop Metformin and Janumet 2 days prior to surgery.    ____ Take 1/2 of usual insulin dose the night before surgery and none on the morning     surgery.   _x___ Follow recommendations from Cardiologist, Pulmonologist or PCP regarding          stopping Aspirin, Coumadin, Plavix ,Eliquis, Effient, or Pradaxa, and Pletal. Stop   Stop Xarelto 1-2 days before surgery if OK with Dr Clayborn Bigness  X____Stop Anti-inflammatories such as Advil, Aleve, Ibuprofen, Motrin, Naproxen, Naprosyn, Goodies powders or aspirin products. OK to take Tylenol and                          Celebrex.   _x___ Stop supplements  until after surgery.  But may continue Vitamin D, Vitamin B,       and multivitamin.   ____ Bring C-Pap to the hospital.

## 2017-10-27 NOTE — Pre-Procedure Instructions (Signed)
Cardiac clearance on chart. 

## 2017-10-29 LAB — URINE CULTURE

## 2017-10-29 LAB — SURGICAL PCR SCREEN
MRSA, PCR: NEGATIVE
STAPHYLOCOCCUS AUREUS: NEGATIVE

## 2017-11-10 MED ORDER — CLINDAMYCIN PHOSPHATE 900 MG/50ML IV SOLN
900.0000 mg | Freq: Once | INTRAVENOUS | Status: AC
  Administered 2017-11-11: 900 mg via INTRAVENOUS

## 2017-11-11 ENCOUNTER — Inpatient Hospital Stay: Payer: Medicare Other | Admitting: Certified Registered Nurse Anesthetist

## 2017-11-11 ENCOUNTER — Other Ambulatory Visit: Payer: Self-pay

## 2017-11-11 ENCOUNTER — Inpatient Hospital Stay
Admission: RE | Admit: 2017-11-11 | Discharge: 2017-11-13 | DRG: 470 | Disposition: A | Discharge status: Skilled Nursing Facility | Payer: Medicare Other | Attending: Orthopedic Surgery | Admitting: Orthopedic Surgery

## 2017-11-11 ENCOUNTER — Encounter
Admission: RE | Disposition: A | Discharge status: Skilled Nursing Facility | Payer: Self-pay | Source: Home / Self Care | Attending: Orthopedic Surgery

## 2017-11-11 ENCOUNTER — Encounter: Payer: Self-pay | Admitting: *Deleted

## 2017-11-11 ENCOUNTER — Inpatient Hospital Stay: Payer: Medicare Other

## 2017-11-11 DIAGNOSIS — Z419 Encounter for procedure for purposes other than remedying health state, unspecified: Secondary | ICD-10-CM

## 2017-11-11 DIAGNOSIS — K219 Gastro-esophageal reflux disease without esophagitis: Secondary | ICD-10-CM | POA: Diagnosis present

## 2017-11-11 DIAGNOSIS — Z7901 Long term (current) use of anticoagulants: Secondary | ICD-10-CM | POA: Diagnosis not present

## 2017-11-11 DIAGNOSIS — Z888 Allergy status to other drugs, medicaments and biological substances status: Secondary | ICD-10-CM

## 2017-11-11 DIAGNOSIS — Z7984 Long term (current) use of oral hypoglycemic drugs: Secondary | ICD-10-CM | POA: Diagnosis not present

## 2017-11-11 DIAGNOSIS — I10 Essential (primary) hypertension: Secondary | ICD-10-CM | POA: Diagnosis present

## 2017-11-11 DIAGNOSIS — M1611 Unilateral primary osteoarthritis, right hip: Secondary | ICD-10-CM | POA: Diagnosis present

## 2017-11-11 DIAGNOSIS — E119 Type 2 diabetes mellitus without complications: Secondary | ICD-10-CM | POA: Diagnosis present

## 2017-11-11 DIAGNOSIS — I4891 Unspecified atrial fibrillation: Secondary | ICD-10-CM | POA: Diagnosis present

## 2017-11-11 DIAGNOSIS — Z880 Allergy status to penicillin: Secondary | ICD-10-CM

## 2017-11-11 DIAGNOSIS — Z79899 Other long term (current) drug therapy: Secondary | ICD-10-CM

## 2017-11-11 DIAGNOSIS — Z7951 Long term (current) use of inhaled steroids: Secondary | ICD-10-CM

## 2017-11-11 DIAGNOSIS — G8918 Other acute postprocedural pain: Secondary | ICD-10-CM

## 2017-11-11 HISTORY — PX: TOTAL HIP ARTHROPLASTY: SHX124

## 2017-11-11 LAB — TYPE AND SCREEN
ABO/RH(D): O POS
ANTIBODY SCREEN: NEGATIVE

## 2017-11-11 LAB — GLUCOSE, CAPILLARY
GLUCOSE-CAPILLARY: 161 mg/dL — AB (ref 65–99)
Glucose-Capillary: 110 mg/dL — ABNORMAL HIGH (ref 65–99)
Glucose-Capillary: 160 mg/dL — ABNORMAL HIGH (ref 65–99)
Glucose-Capillary: 147 mg/dL — ABNORMAL HIGH (ref 65–99)
Glucose-Capillary: 172 mg/dL — ABNORMAL HIGH (ref 65–99)

## 2017-11-11 LAB — PROTIME-INR
INR: 0.9
Prothrombin Time: 12.1 seconds (ref 11.4–15.2)

## 2017-11-11 LAB — APTT: aPTT: 28 seconds (ref 24–36)

## 2017-11-11 SURGERY — ARTHROPLASTY, HIP, TOTAL, ANTERIOR APPROACH
Anesthesia: Spinal | Laterality: Right

## 2017-11-11 MED ORDER — ACETAMINOPHEN 325 MG PO TABS: 325.0000 mg | ORAL_TABLET | Freq: Four times a day (QID) | ORAL | Status: DC | PRN

## 2017-11-11 MED ORDER — ONDANSETRON HCL 4 MG/2ML IJ SOLN: 4.0000 mg | Freq: Once | INTRAMUSCULAR | Status: DC | PRN

## 2017-11-11 MED ORDER — LIDOCAINE HCL (CARDIAC) PF 100 MG/5ML IV SOSY
PREFILLED_SYRINGE | INTRAVENOUS | Status: DC | PRN
  Administered 2017-11-11: 40 mg via INTRAVENOUS

## 2017-11-11 MED ORDER — GLIMEPIRIDE 2 MG PO TABS
4.0000 mg | ORAL_TABLET | Freq: Every day | ORAL | Status: DC
  Administered 2017-11-13: 4 mg via ORAL
  Filled 2017-11-11: qty 2
  Filled 2017-11-11 (×2): qty 1

## 2017-11-11 MED ORDER — BUPIVACAINE-EPINEPHRINE 0.25% -1:200000 IJ SOLN
INTRAMUSCULAR | Status: DC | PRN
  Administered 2017-11-11: 60 mL

## 2017-11-11 MED ORDER — KETAMINE HCL 50 MG/ML IJ SOLN
INTRAMUSCULAR | Status: AC
  Filled 2017-11-11: qty 10

## 2017-11-11 MED ORDER — CLINDAMYCIN PHOSPHATE 900 MG/50ML IV SOLN
INTRAVENOUS | Status: AC
  Filled 2017-11-11: qty 50

## 2017-11-11 MED ORDER — METHOCARBAMOL 1000 MG/10ML IJ SOLN
500.0000 mg | Freq: Four times a day (QID) | INTRAVENOUS | Status: DC | PRN
  Filled 2017-11-11: qty 5

## 2017-11-11 MED ORDER — HYDROCODONE-ACETAMINOPHEN 7.5-325 MG PO TABS
1.0000 | ORAL_TABLET | ORAL | Status: DC | PRN
  Administered 2017-11-13 (×2): 1 via ORAL
  Filled 2017-11-11 (×2): qty 1

## 2017-11-11 MED ORDER — SODIUM CHLORIDE 0.9 % IV SOLN
INTRAVENOUS | Status: DC
  Administered 2017-11-11: 13:00:00 via INTRAVENOUS
  Administered 2017-11-12: 75 mL/h via INTRAVENOUS

## 2017-11-11 MED ORDER — BUPIVACAINE HCL (PF) 0.5 % IJ SOLN
INTRAMUSCULAR | Status: DC | PRN
  Administered 2017-11-11: 3 mL

## 2017-11-11 MED ORDER — BUPIVACAINE HCL (PF) 0.5 % IJ SOLN
INTRAMUSCULAR | Status: AC
  Filled 2017-11-11: qty 10

## 2017-11-11 MED ORDER — RIVAROXABAN 20 MG PO TABS
20.0000 mg | ORAL_TABLET | Freq: Every day | ORAL | Status: DC
  Administered 2017-11-12 – 2017-11-13 (×2): 20 mg via ORAL
  Filled 2017-11-11 (×2): qty 1

## 2017-11-11 MED ORDER — OCUVITE-LUTEIN PO CAPS
1.0000 | ORAL_CAPSULE | Freq: Every day | ORAL | Status: DC
  Administered 2017-11-11 – 2017-11-13 (×3): 1 via ORAL
  Filled 2017-11-11 (×3): qty 1

## 2017-11-11 MED ORDER — ONDANSETRON HCL 4 MG/2ML IJ SOLN
INTRAMUSCULAR | Status: DC | PRN
  Administered 2017-11-11: 4 mg via INTRAVENOUS

## 2017-11-11 MED ORDER — DIPHENHYDRAMINE HCL 12.5 MG/5ML PO ELIX: 12.5000 mg | ORAL_SOLUTION | ORAL | Status: DC | PRN

## 2017-11-11 MED ORDER — METOCLOPRAMIDE HCL 10 MG PO TABS: 5.0000 mg | ORAL_TABLET | Freq: Three times a day (TID) | ORAL | Status: DC | PRN

## 2017-11-11 MED ORDER — SODIUM CHLORIDE 0.9 % IV SOLN
INTRAVENOUS | Status: DC
  Administered 2017-11-11: 07:00:00 via INTRAVENOUS

## 2017-11-11 MED ORDER — SUCCINYLCHOLINE CHLORIDE 20 MG/ML IJ SOLN
INTRAMUSCULAR | Status: AC
Start: 2017-11-11 — End: ?
  Filled 2017-11-11: qty 1

## 2017-11-11 MED ORDER — MORPHINE SULFATE (PF) 2 MG/ML IV SOLN
0.5000 mg | INTRAVENOUS | Status: DC | PRN
  Administered 2017-11-11: 0.5 mg via INTRAVENOUS
  Filled 2017-11-11: qty 1

## 2017-11-11 MED ORDER — HYDROXYZINE HCL 25 MG PO TABS
25.0000 mg | ORAL_TABLET | Freq: Three times a day (TID) | ORAL | Status: DC | PRN
  Filled 2017-11-11: qty 1

## 2017-11-11 MED ORDER — GLYCOPYRROLATE PF 0.2 MG/ML IJ SOSY
PREFILLED_SYRINGE | INTRAMUSCULAR | Status: DC | PRN
  Administered 2017-11-11 (×2): .2 mg via INTRAVENOUS

## 2017-11-11 MED ORDER — SUMATRIPTAN SUCCINATE 50 MG PO TABS
25.0000 mg | ORAL_TABLET | ORAL | Status: DC | PRN
  Filled 2017-11-11: qty 1

## 2017-11-11 MED ORDER — TRAMADOL HCL 50 MG PO TABS
50.0000 mg | ORAL_TABLET | Freq: Four times a day (QID) | ORAL | Status: DC
  Administered 2017-11-11 – 2017-11-13 (×7): 50 mg via ORAL
  Filled 2017-11-11 (×7): qty 1

## 2017-11-11 MED ORDER — PANTOPRAZOLE SODIUM 40 MG PO TBEC
40.0000 mg | DELAYED_RELEASE_TABLET | Freq: Two times a day (BID) | ORAL | Status: DC
  Administered 2017-11-11 – 2017-11-13 (×4): 40 mg via ORAL
  Filled 2017-11-11 (×4): qty 1

## 2017-11-11 MED ORDER — SEVOFLURANE IN SOLN
RESPIRATORY_TRACT | Status: AC
  Filled 2017-11-11: qty 250

## 2017-11-11 MED ORDER — PROPOFOL 500 MG/50ML IV EMUL
INTRAVENOUS | Status: DC | PRN
  Administered 2017-11-11: 60 ug/kg/min via INTRAVENOUS

## 2017-11-11 MED ORDER — PROPOFOL 10 MG/ML IV BOLUS
INTRAVENOUS | Status: AC
Start: 2017-11-11 — End: ?
  Filled 2017-11-11: qty 40

## 2017-11-11 MED ORDER — ACETAMINOPHEN 500 MG PO TABS
500.0000 mg | ORAL_TABLET | Freq: Four times a day (QID) | ORAL | Status: AC
  Administered 2017-11-12: 500 mg via ORAL
  Filled 2017-11-11: qty 1

## 2017-11-11 MED ORDER — ONDANSETRON HCL 4 MG/2ML IJ SOLN
4.0000 mg | Freq: Four times a day (QID) | INTRAMUSCULAR | Status: DC | PRN
Start: 2017-11-11 — End: 2017-11-13
  Administered 2017-11-12: 4 mg via INTRAVENOUS
  Filled 2017-11-11: qty 2

## 2017-11-11 MED ORDER — FLECAINIDE ACETATE 50 MG PO TABS
50.0000 mg | ORAL_TABLET | Freq: Two times a day (BID) | ORAL | Status: DC
Start: 2017-11-11 — End: 2017-11-13
  Administered 2017-11-11 – 2017-11-13 (×4): 50 mg via ORAL
  Filled 2017-11-11 (×5): qty 1

## 2017-11-11 MED ORDER — PROPOFOL 10 MG/ML IV BOLUS
INTRAVENOUS | Status: DC | PRN
  Administered 2017-11-11: 20 mg via INTRAVENOUS

## 2017-11-11 MED ORDER — ALBUTEROL SULFATE (2.5 MG/3ML) 0.083% IN NEBU
2.5000 mg | INHALATION_SOLUTION | Freq: Four times a day (QID) | RESPIRATORY_TRACT | Status: DC | PRN
Start: 2017-11-11 — End: 2017-11-13

## 2017-11-11 MED ORDER — FUROSEMIDE 20 MG PO TABS: 20.0000 mg | ORAL_TABLET | Freq: Every day | ORAL | Status: DC | PRN

## 2017-11-11 MED ORDER — NEOMYCIN-POLYMYXIN B GU 40-200000 IR SOLN
Status: DC | PRN
  Administered 2017-11-11: 4 mL

## 2017-11-11 MED ORDER — METOPROLOL SUCCINATE ER 50 MG PO TB24
50.0000 mg | ORAL_TABLET | Freq: Every day | ORAL | Status: DC
  Administered 2017-11-12 – 2017-11-13 (×2): 50 mg via ORAL
  Filled 2017-11-11 (×2): qty 1

## 2017-11-11 MED ORDER — EPHEDRINE SULFATE 50 MG/ML IJ SOLN
INTRAMUSCULAR | Status: AC
  Filled 2017-11-11: qty 1

## 2017-11-11 MED ORDER — EPHEDRINE SULFATE 50 MG/ML IJ SOLN
INTRAMUSCULAR | Status: DC | PRN
  Administered 2017-11-11: 5 mg via INTRAVENOUS
  Administered 2017-11-11: 10 mg via INTRAVENOUS

## 2017-11-11 MED ORDER — METHOCARBAMOL 500 MG PO TABS: 500.0000 mg | ORAL_TABLET | Freq: Four times a day (QID) | ORAL | Status: DC | PRN

## 2017-11-11 MED ORDER — BISACODYL 5 MG PO TBEC
5.0000 mg | DELAYED_RELEASE_TABLET | Freq: Every day | ORAL | Status: DC | PRN
  Administered 2017-11-12: 5 mg via ORAL
  Filled 2017-11-11: qty 1

## 2017-11-11 MED ORDER — MENTHOL 3 MG MT LOZG
1.0000 | LOZENGE | OROMUCOSAL | Status: DC | PRN
  Filled 2017-11-11: qty 9

## 2017-11-11 MED ORDER — ONDANSETRON HCL 4 MG PO TABS: 4.0000 mg | ORAL_TABLET | Freq: Four times a day (QID) | ORAL | Status: DC | PRN

## 2017-11-11 MED ORDER — PROPOFOL 500 MG/50ML IV EMUL
INTRAVENOUS | Status: AC
Start: 2017-11-11 — End: ?
  Filled 2017-11-11: qty 50

## 2017-11-11 MED ORDER — FENTANYL CITRATE (PF) 100 MCG/2ML IJ SOLN: 25.0000 ug | INTRAMUSCULAR | Status: DC | PRN

## 2017-11-11 MED ORDER — KETAMINE HCL 10 MG/ML IJ SOLN
INTRAMUSCULAR | Status: DC | PRN
  Administered 2017-11-11: 50 mg via INTRAVENOUS

## 2017-11-11 MED ORDER — HYDROCODONE-ACETAMINOPHEN 5-325 MG PO TABS
1.0000 | ORAL_TABLET | ORAL | Status: DC | PRN
  Administered 2017-11-11 – 2017-11-12 (×5): 2 via ORAL
  Filled 2017-11-11 (×5): qty 2

## 2017-11-11 MED ORDER — INSULIN ASPART 100 UNIT/ML ~~LOC~~ SOLN
0.0000 [IU] | Freq: Three times a day (TID) | SUBCUTANEOUS | Status: DC
  Administered 2017-11-11: 2 [IU] via SUBCUTANEOUS
  Administered 2017-11-11 – 2017-11-12 (×2): 3 [IU] via SUBCUTANEOUS
  Administered 2017-11-12: 5 [IU] via SUBCUTANEOUS
  Administered 2017-11-12 – 2017-11-13 (×3): 3 [IU] via SUBCUTANEOUS
  Filled 2017-11-11 (×7): qty 1

## 2017-11-11 MED ORDER — ALBUTEROL SULFATE HFA 108 (90 BASE) MCG/ACT IN AERS: 2.0000 | INHALATION_SPRAY | Freq: Four times a day (QID) | RESPIRATORY_TRACT | Status: DC | PRN

## 2017-11-11 MED ORDER — PHENOL 1.4 % MT LIQD
1.0000 | OROMUCOSAL | Status: DC | PRN
  Filled 2017-11-11 (×2): qty 177

## 2017-11-11 MED ORDER — HYDROCHLOROTHIAZIDE 25 MG PO TABS
25.0000 mg | ORAL_TABLET | Freq: Every day | ORAL | Status: DC
  Administered 2017-11-12 – 2017-11-13 (×2): 25 mg via ORAL
  Filled 2017-11-11 (×2): qty 1

## 2017-11-11 MED ORDER — ZOLPIDEM TARTRATE 5 MG PO TABS: 5.0000 mg | ORAL_TABLET | Freq: Every evening | ORAL | Status: DC | PRN

## 2017-11-11 MED ORDER — PHENYLEPHRINE HCL 10 MG/ML IJ SOLN
INTRAMUSCULAR | Status: AC
  Filled 2017-11-11: qty 1

## 2017-11-11 MED ORDER — MAGNESIUM HYDROXIDE 400 MG/5ML PO SUSP
30.0000 mL | Freq: Every day | ORAL | Status: DC | PRN
  Administered 2017-11-12: 30 mL via ORAL
  Filled 2017-11-11: qty 30

## 2017-11-11 MED ORDER — MAGNESIUM CITRATE PO SOLN
1.0000 | Freq: Once | ORAL | Status: DC | PRN
  Filled 2017-11-11: qty 296

## 2017-11-11 MED ORDER — DOCUSATE SODIUM 100 MG PO CAPS
100.0000 mg | ORAL_CAPSULE | Freq: Two times a day (BID) | ORAL | Status: DC
  Administered 2017-11-11 – 2017-11-13 (×5): 100 mg via ORAL
  Filled 2017-11-11 (×5): qty 1

## 2017-11-11 MED ORDER — SODIUM CHLORIDE 0.9 % IV SOLN
INTRAVENOUS | Status: DC | PRN
  Administered 2017-11-11: 40 ug/min via INTRAVENOUS

## 2017-11-11 MED ORDER — GABAPENTIN 300 MG PO CAPS
300.0000 mg | ORAL_CAPSULE | Freq: Three times a day (TID) | ORAL | Status: DC
  Administered 2017-11-11 – 2017-11-13 (×7): 300 mg via ORAL
  Filled 2017-11-11 (×7): qty 1

## 2017-11-11 MED ORDER — ONDANSETRON HCL 4 MG/2ML IJ SOLN
INTRAMUSCULAR | Status: AC
Start: 2017-11-11 — End: ?
  Filled 2017-11-11: qty 2

## 2017-11-11 MED ORDER — CLINDAMYCIN PHOSPHATE 600 MG/50ML IV SOLN
600.0000 mg | Freq: Four times a day (QID) | INTRAVENOUS | Status: AC
  Administered 2017-11-11 – 2017-11-12 (×3): 600 mg via INTRAVENOUS
  Filled 2017-11-11 (×3): qty 50

## 2017-11-11 MED ORDER — METOCLOPRAMIDE HCL 5 MG/ML IJ SOLN: 5.0000 mg | Freq: Three times a day (TID) | INTRAMUSCULAR | Status: DC | PRN

## 2017-11-11 MED ORDER — MOMETASONE FURO-FORMOTEROL FUM 200-5 MCG/ACT IN AERO
2.0000 | INHALATION_SPRAY | Freq: Two times a day (BID) | RESPIRATORY_TRACT | Status: DC
  Administered 2017-11-11 – 2017-11-13 (×4): 2 via RESPIRATORY_TRACT
  Filled 2017-11-11: qty 8.8

## 2017-11-11 SURGICAL SUPPLY — 55 items
BLADE SAGITTAL AGGR TOOTH XLG (BLADE) ×3 IMPLANT
BNDG COHESIVE 6X5 TAN STRL LF (GAUZE/BANDAGES/DRESSINGS) ×9 IMPLANT
CANISTER SUCT 1200ML W/VALVE (MISCELLANEOUS) ×3 IMPLANT
CHLORAPREP W/TINT 26ML (MISCELLANEOUS) ×3 IMPLANT
DRAPE C-ARM XRAY 36X54 (DRAPES) ×3 IMPLANT
DRAPE INCISE IOBAN 66X60 STRL (DRAPES) IMPLANT
DRAPE POUCH INSTRU U-SHP 10X18 (DRAPES) ×3 IMPLANT
DRAPE SHEET LG 3/4 BI-LAMINATE (DRAPES) ×9 IMPLANT
DRAPE TABLE BACK 80X90 (DRAPES) ×3 IMPLANT
DRESSING SURGICEL FIBRLLR 1X2 (HEMOSTASIS) ×2 IMPLANT
DRSG OPSITE POSTOP 4X8 (GAUZE/BANDAGES/DRESSINGS) ×6 IMPLANT
DRSG SURGICEL FIBRILLAR 1X2 (HEMOSTASIS) ×6
ELECT BLADE 6.5 EXT (BLADE) ×3 IMPLANT
ELECT REM PT RETURN 9FT ADLT (ELECTROSURGICAL) ×3
ELECTRODE REM PT RTRN 9FT ADLT (ELECTROSURGICAL) ×1 IMPLANT
GLOVE BIOGEL PI IND STRL 9 (GLOVE) ×1 IMPLANT
GLOVE BIOGEL PI INDICATOR 9 (GLOVE) ×2
GLOVE SURG SYN 9.0  PF PI (GLOVE) ×4
GLOVE SURG SYN 9.0 PF PI (GLOVE) ×2 IMPLANT
GOWN SRG 2XL LVL 4 RGLN SLV (GOWNS) ×1 IMPLANT
GOWN STRL NON-REIN 2XL LVL4 (GOWNS) ×2
GOWN STRL REUS W/ TWL LRG LVL3 (GOWN DISPOSABLE) ×1 IMPLANT
GOWN STRL REUS W/TWL LRG LVL3 (GOWN DISPOSABLE) ×2
HEMOVAC 400CC 10FR (MISCELLANEOUS) IMPLANT
HIP FEM HD M 28 (Head) ×3 IMPLANT
HOLDER FOLEY CATH W/STRAP (MISCELLANEOUS) ×3 IMPLANT
HOOD PEEL AWAY FLYTE STAYCOOL (MISCELLANEOUS) ×3 IMPLANT
KIT PREVENA INCISION MGT 13 (CANNISTER) ×3 IMPLANT
LINER DUAL MOB 50MM (Liner) ×3 IMPLANT
MAT BLUE FLOOR 46X72 FLO (MISCELLANEOUS) ×3 IMPLANT
NDL SAFETY ECLIPSE 18X1.5 (NEEDLE) ×1 IMPLANT
NEEDLE HYPO 18GX1.5 SHARP (NEEDLE) ×2
NEEDLE SPNL 18GX3.5 QUINCKE PK (NEEDLE) ×3 IMPLANT
NS IRRIG 1000ML POUR BTL (IV SOLUTION) ×3 IMPLANT
PACK HIP COMPR (MISCELLANEOUS) ×3 IMPLANT
SHELL ACETABULAR SZ0 50 DME (Shell) ×3 IMPLANT
SOL PREP PVP 2OZ (MISCELLANEOUS) ×3
SOLUTION PREP PVP 2OZ (MISCELLANEOUS) ×1 IMPLANT
SPONGE DRAIN TRACH 4X4 STRL 2S (GAUZE/BANDAGES/DRESSINGS) ×3 IMPLANT
STAPLER SKIN PROX 35W (STAPLE) ×3 IMPLANT
STEM FEMORAL 1 STD COLLARED (Stem) ×3 IMPLANT
STRAP SAFETY 5IN WIDE (MISCELLANEOUS) ×3 IMPLANT
SUT DVC 2 QUILL PDO  T11 36X36 (SUTURE) ×2
SUT DVC 2 QUILL PDO T11 36X36 (SUTURE) ×1 IMPLANT
SUT SILK 0 (SUTURE) ×2
SUT SILK 0 30XBRD TIE 6 (SUTURE) ×1 IMPLANT
SUT V-LOC 90 ABS DVC 3-0 CL (SUTURE) ×3 IMPLANT
SUT VIC AB 1 CT1 36 (SUTURE) ×3 IMPLANT
SYR 20CC LL (SYRINGE) ×3 IMPLANT
SYR 30ML LL (SYRINGE) ×3 IMPLANT
SYR BULB IRRIG 60ML STRL (SYRINGE) ×3 IMPLANT
TAPE MICROFOAM 4IN (TAPE) ×3 IMPLANT
TOWEL OR 17X26 4PK STRL BLUE (TOWEL DISPOSABLE) ×3 IMPLANT
TRAY FOLEY MTR SLVR 16FR STAT (SET/KITS/TRAYS/PACK) ×3 IMPLANT
WND VAC CANISTER 500ML (MISCELLANEOUS) ×3 IMPLANT

## 2017-11-11 NOTE — H&P (Signed)
Reviewed paper H+P, will be scanned into chart. No changes noted.  

## 2017-11-11 NOTE — Progress Notes (Signed)
Mallory Arellano received an OR for room 142 A, [place]. I identified myself as the [facility], and was welcomed into the room (pastoral presence ensued). The patient's sister was sitting beside her. Mallory Arellano seemed to be in discomfort from a now surgically repaired Rt Hip. She spoke of her church affiliation and work as a [employer] years prior. She was requesting prayer and words of comfort. Prayer was initiated with both the patient and sister holding hands, words of encouragement were offered. Upon leaving salutations of having a blessed night were shared. The patient was encouraged to reach out to the [facility] if any additional needs arise over night.   11/11/17 1700  Clinical Encounter Type  Visited With Patient and family together  Visit Type Initial;Other (Comment)  Referral From  (Order request)  Consult/Referral To Physician  Spiritual Encounters  Spiritual Needs Prayer  Stress Factors  Patient Stress Factors Major life changes  Family Stress Factors None identified

## 2017-11-11 NOTE — Anesthesia Preprocedure Evaluation (Signed)
Anesthesia Evaluation  Patient identified by MRN, date of birth, ID band Patient awake    Reviewed: Allergy & Precautions, H&P , NPO status , Patient's Chart, lab work & pertinent test results, reviewed documented beta blocker date and time   History of Anesthesia Complications Negative for: history of anesthetic complications  Airway Mallampati: IV  TM Distance: >3 FB Neck ROM: full    Dental  (+) Partial Lower, Edentulous Upper, Upper Dentures, Dental Advidsory Given   Pulmonary neg shortness of breath, asthma , neg COPD, neg recent URI,           Cardiovascular Exercise Tolerance: Good hypertension, (-) angina(-) CAD, (-) Past MI, (-) Cardiac Stents and (-) CABG + dysrhythmias Atrial Fibrillation (-) Valvular Problems/Murmurs     Neuro/Psych negative neurological ROS  negative psych ROS   GI/Hepatic Neg liver ROS, GERD  ,  Endo/Other  diabetes  Renal/GU negative Renal ROS  negative genitourinary   Musculoskeletal   Abdominal   Peds  Hematology negative hematology ROS (+)   Anesthesia Other Findings Past Medical History: No date: A-fib (HCC) No date: Arthritis No date: Asthma No date: Diabetes mellitus without complication (HCC) No date: Dysrhythmia No date: GERD (gastroesophageal reflux disease) No date: Hypercholesteremia No date: Hypertension No date: Lower extremity edema No date: Palpitation No date: UTI (urinary tract infection)   Reproductive/Obstetrics negative OB ROS                             Anesthesia Physical Anesthesia Plan  ASA: III  Anesthesia Plan: Spinal   Post-op Pain Management:    Induction: Intravenous  PONV Risk Score and Plan: 2 and Propofol infusion  Airway Management Planned: Simple Face Mask  Additional Equipment:   Intra-op Plan:   Post-operative Plan:   Informed Consent: I have reviewed the patients History and Physical, chart, labs  and discussed the procedure including the risks, benefits and alternatives for the proposed anesthesia with the patient or authorized representative who has indicated his/her understanding and acceptance.   Dental Advisory Given  Plan Discussed with: Anesthesiologist, CRNA and Surgeon  Anesthesia Plan Comments:         Anesthesia Quick Evaluation

## 2017-11-11 NOTE — Evaluation (Signed)
Physical Therapy Evaluation Patient Details Name: Mallory Arellano MRN: [id number] DOB: 1940-11-21 Today's Date: 11/11/2017   History of Present Illness  Pt is a 77 y/o F s/p direct anterior R THA.  Pt's PMH includes a-fib.  Clinical Impression  Pt is s/p THA resulting in the deficits listed below (see PT Problem List). Pt was very pleasant but very pain focused saying, "I don't like pain".  Pt currently requires mod assist for supine<>sit.  Unable to advance to OOB this afternoon as pt reported dizziness sitting EOB that did not clear but remained the same after sitting for ~4 minutes.  BP in supine 162/69.  Pt required assist for supine therapeutic exercises.  Pt lives with her son who is at work during the day with no additional family or friends able to assist at d/c.  Given pt's current mobility status, recommending SNF at d/c.   Pt will benefit from skilled PT to increase their independence and safety with mobility to allow discharge to the venue listed below.     Follow Up Recommendations SNF    Equipment Recommendations  Other (comment)(TBD at next venue of care)    Recommendations for Other Services       Precautions / Restrictions Precautions Precautions: Fall Precaution Comments: Direct anterior, no hip precautions Restrictions Weight Bearing Restrictions: Yes RLE Weight Bearing: Weight bearing as tolerated      Mobility  Bed Mobility Overal bed mobility: Needs Assistance Bed Mobility: Supine to Sit;Sit to Supine     Supine to sit: Mod assist;HOB elevated Sit to supine: Mod assist   General bed mobility comments: Cues for proper sequencing and assist to elevate trunk and advance RLE to EOB. To return to supine the pt requires assist to bring LEs back into bed.   Transfers Overall transfer level: Needs assistance Equipment used: None Transfers: Lateral/Scoot Transfers          Lateral/Scoot Transfers: Min assist General transfer comment:  Pt performed lateral  scooting at EOB with min assist by using bed pad.   Ambulation/Gait             General Gait Details: Unable to assess at this time.   Stairs            Wheelchair Mobility    Modified Rankin (Stroke Patients Only)       Balance Overall balance assessment: Needs assistance Sitting-balance support: Single extremity supported;Feet supported Sitting balance-Leahy Scale: Poor Sitting balance - Comments: Pt relied on at least 1UE support for static sitting EOB.  Pt significantly leaning to L to take weight off of R hip.                                      Pertinent Vitals/Pain Pain Assessment: 0-10 Pain Score: 9  Pain Location: R hip Pain Descriptors / Indicators: Aching;Grimacing;Guarding;Moaning Pain Intervention(s): Limited activity within patient's tolerance;Monitored during session;Repositioned;Ice applied;Utilized relaxation techniques    Home Living Family/patient expects to be discharged to:: Skilled nursing facility Living Arrangements: Children(son) Available Help at Discharge: Family;Available PRN/intermittently(son works during the day) Type of Home: House Home Access: Level entry     Home Layout: One [place]: Environmental consultant - 4 wheels;Wheelchair - manual      Prior Function Level of Independence: Independent with assistive device(s)         Comments: Pt uses rollator at all times. 2 falls in the past  6 months.  Independent with bathing, dressing, cooking.  Pt's family does the driving due to R hip pain.      Hand Dominance        Extremity/Trunk Assessment   Upper Extremity Assessment Upper Extremity Assessment: Overall WFL for tasks assessed    Lower Extremity Assessment Lower Extremity Assessment: RLE deficits/detail RLE Deficits / Details: Pt requires assist for SLR, hip Abd RLE: Unable to fully assess due to pain       Communication   Communication: No difficulties  Cognition Arousal/Alertness:  Awake/alert Behavior During Therapy: WFL for tasks assessed/performed Overall Cognitive Status: Within Functional Limits for tasks assessed                                        General Comments General comments (skin integrity, edema, etc.): Pt reported lightheadedness with supine>sit which did not improve but remained the same after sitting for ~4 minutes EOB.  Pt therefore assisted back to supine and BP taken 162/69.      Exercises Total Joint Exercises Ankle Circles/Pumps: AROM;Both;10 reps;Supine Quad Sets: Strengthening;Both;10 reps;Supine Hip ABduction/ADduction: AAROM;Right;10 reps;Supine Straight Leg Raises: AAROM;Right;10 reps;Supine   Assessment/Plan    PT Assessment Patient needs continued PT services  PT Problem List Decreased strength;Decreased range of motion;Decreased activity tolerance;Decreased balance;Decreased mobility;Decreased knowledge of use of DME;Decreased safety awareness;Pain       PT Treatment Interventions DME instruction;Gait training;Stair training;Functional mobility training;Therapeutic exercise;Therapeutic activities;Balance training;Neuromuscular re-education;Patient/family education;Modalities;Wheelchair mobility training    PT Goals (Current goals can be found in the Care Plan section)  Acute Rehab PT Goals Patient Stated Goal: to go to rehab before home PT Goal Formulation: With patient Time For Goal Achievement: 11/25/17 Potential to Achieve Goals: Good    Frequency BID   Barriers to discharge Decreased caregiver support No assist available during the day at d/c    Co-evaluation               AM-PAC PT "6 Clicks" Daily Activity  Outcome Measure Difficulty turning over in bed (including adjusting bedclothes, sheets and blankets)?: Unable Difficulty moving from lying on back to sitting on the side of the bed? : Unable Difficulty sitting down on and standing up from a chair with arms (e.g., wheelchair, bedside  commode, etc,.)?: Unable Help needed moving to and from a bed to chair (including a wheelchair)?: A Lot Help needed walking in hospital room?: A Lot Help needed climbing 3-5 steps with a railing? : Total 6 Click Score: 8    End of Session   Activity Tolerance: Patient limited by pain;Other (comment)(limited by lightheadedness) Patient left: in bed;with call bell/phone within reach;with bed alarm set;with SCD's reapplied;with family/visitor present Nurse Communication: Mobility status;Other (comment)(pt's dizziness in sitting; BP; pain 9/10) PT Visit Diagnosis: Pain;Muscle weakness (generalized) (M62.81);Unsteadiness on feet (R26.81);Difficulty in walking, not elsewhere classified (R26.2) Pain - Right/Left: Right Pain - part of body: Hip    Time: 7412-8786 PT Time Calculation (min) (ACUTE ONLY): 37 min   Charges:   PT Evaluation $PT Eval Moderate Complexity: 1 Mod PT Treatments $Therapeutic Exercise: 8-22 mins $Therapeutic Activity: 8-22 mins   PT G Codes:       Collie Siad PT, DPT 11/11/2017, 4:24 PM

## 2017-11-11 NOTE — Op Note (Signed)
11/11/2017  9:08 AM  PATIENT:  Mallory Arellano  77 y.o. female  PRE-OPERATIVE DIAGNOSIS:  primary osteoarthritis of right hip  POST-OPERATIVE DIAGNOSIS:  primary osteoarthritis of right hip  PROCEDURE:  Procedure(s): TOTAL HIP ARTHROPLASTY ANTERIOR APPROACH (Right)  SURGEON: Laurene Footman, MD  ASSISTANTS: None  ANESTHESIA:   spinal  EBL:  Total I/O In: 500 [I.V.:500] Out: 250 [Urine:50; Blood:200]  BLOOD ADMINISTERED:none  DRAINS: none   LOCAL MEDICATIONS USED:  MARCAINE     SPECIMEN:  Source of Specimen:  Right femoral head  DISPOSITION OF SPECIMEN:  PATHOLOGY  COUNTS:  YES  TOURNIQUET:  * No tourniquets in log *  IMPLANTS: Medacta AMIS 1 standard stem with 50 mm Mpact DM cup with liner and M 28 mm metal head  DICTATION: .Dragon Dictation   The patient was brought to the operating room and after spinal anesthesia was obtained patient was placed on the operative table with the ipsilateral foot into the Medacta attachment, contralateral leg on a well-padded table. C-arm was brought in and preop template x-ray taken. After prepping and draping in usual sterile fashion appropriate patient identification and timeout procedures were completed. Anterior approach to the hip was obtained and centered over the greater trochanter and TFL muscle. The subcutaneous tissue was incised hemostasis being achieved by electrocautery. TFL fascia was incised and the muscle retracted laterally deep retractor placed. The lateral femoral circumflex vessels were identified and ligated. The anterior capsule was exposed and a capsulotomy performed. The neck was identified and a femoral neck cut carried out with a saw. The head was removed without difficulty and showed sclerotic femoral head and acetabulum. Reaming was carried out to 48 mm and a 50 mm cup trial gave appropriate tightness to the acetabular component a 50 mm DM cup was impacted into position. The leg was then externally rotated and  ischiofemoral and pubofemoral releases carried out. The femur was sequentially broached to a size 1, size 1 standard with S head trials were placed and the final components chosen. The one standard stem was inserted along with a millimeter metal 28 mm head and 50 mm liner. The hip was reduced and was stable the wound was thoroughly irrigated with fibrillar placed on the posterior capsule and medial neck n. The deep fascia using a heavy Quill after infiltration of 30 cc of quarter percent Sensorcaine with epinephrine. 3-0 v-loc subcutaneous closure followed by skin staples and incisional wound VAC  PLAN OF CARE: Admit to inpatient

## 2017-11-11 NOTE — Progress Notes (Signed)
Pt arrived to room 142 from PACU. Pt is A&Ox4. Wound vac in place to right hip, foley draining. Pt oriented to plan of care and pain medication regimen. Pt stated that she has a low pain tolerance. Vicodin and tramadol were given with minimal relief; morphine given and pt reported more relief. Family is at bedside.  [place], Jerry Caras

## 2017-11-11 NOTE — Transfer of Care (Signed)
Immediate Anesthesia Transfer of Care Note  Patient: Mallory Arellano  Procedure(s) Performed: TOTAL HIP ARTHROPLASTY ANTERIOR APPROACH (Right )  Patient Location: PACU  Anesthesia Type:MAC and Spinal  Level of Consciousness: alert   Airway & Oxygen Therapy: Patient Spontanous Breathing and Patient connected to face mask oxygen  Post-op Assessment: Report given to RN and Post -op Vital signs reviewed and stable  Post vital signs: Reviewed and stable  Last Vitals:  Vitals Value Taken Time  BP 131/54 11/11/2017  9:13 AM  Temp    Pulse 53 11/11/2017  9:14 AM  Resp 18 11/11/2017  9:14 AM  SpO2 100 % 11/11/2017  9:14 AM  Vitals shown include unvalidated device data.  Last Pain:  Vitals:   11/11/17 0633  TempSrc: Oral  PainSc: 7       Patients Stated Pain Goal: 3 (47/65/46 5035)  Complications: No apparent anesthesia complications

## 2017-11-11 NOTE — Anesthesia Procedure Notes (Addendum)
Spinal  Patient location during procedure: OR Start time: 11/11/2017 7:33 AM End time: 11/11/2017 7:39 AM Staffing Anesthesiologist: Martha Clan, MD Resident/CRNA: Eben Burow, CRNA Other anesthesia staff: Loleta Books, RN Performed: other anesthesia staff  Preanesthetic Checklist Completed: patient identified, site marked, surgical consent, pre-op evaluation, timeout performed, IV checked, risks and benefits discussed and monitors and equipment checked Spinal Block Patient position: sitting Prep: Betadine Patient monitoring: heart rate, continuous pulse ox and blood pressure Approach: midline Location: L3-4 Injection technique: single-shot Needle Needle type: Introducer and Whitacre  Needle gauge: 25 G Needle length: 10 cm Assessment Sensory level: T8 Additional Notes Patient identified and timeout performed prior to procedure.  Expiration on kit checked and within date.  Patient tolerated the procedure well, clear CSF return prior to injection.  No paresthesias noted.

## 2017-11-11 NOTE — Anesthesia Post-op Follow-up Note (Signed)
Anesthesia QCDR form completed.        

## 2017-11-11 NOTE — NC FL2 (Signed)
[place] LEVEL OF CARE SCREENING TOOL     IDENTIFICATION  Patient Name: Mallory Arellano Birthdate: 05-22-1941 Sex: female Admission Date (Current Location): 11/11/2017  [place] and [place] Number:  Engineering geologist and Address:  [facility], [address] [place], [place] [address]      Provider Number: [id number]  Attending Physician Name and Address:  Hessie Knows, MD  Relative Name and Phone Number:       Current Level of Care: Hospital Recommended Level of Care: [place] Prior Approval Number:    Date Approved/Denied:   PASRR Number: [id number])  Discharge Plan: SNF    Current Diagnoses: Patient Active Problem List   Diagnosis Date Noted  . Primary localized osteoarthritis of right hip 11/11/2017  . Symptomatic bradycardia 02/12/2017  . Chronic cough 06/18/2015  . Plantar fascial fibromatosis 06/18/2015  . Preventative health care 01/18/2015  . Paroxysmal atrial fibrillation ([place]) 12/30/2014  . Hypertension, essential, benign 10/16/2014  . Mild persistent chronic asthma without complication 92/03/9416  . Allergic rhinitis 10/16/2014  . GERD (gastroesophageal reflux disease) 10/16/2014  . HLD (hyperlipidemia)   . Arthritis of knee, degenerative 09/07/2014  . DM (diabetes mellitus), type 2, uncontrolled ([place]) 10/26/2012    Orientation RESPIRATION BLADDER Height & Weight     Self, Time, Situation, Place  Normal Continent Weight: 185 lb (83.9 kg) Height:  5\' 6"  (167.6 cm)  BEHAVIORAL SYMPTOMS/MOOD NEUROLOGICAL BOWEL NUTRITION STATUS      Continent Diet(Diet: Carb Modified. )  AMBULATORY STATUS COMMUNICATION OF NEEDS Skin   Extensive Assist Verbally Surgical wounds(Incision: Right Hip. )                       Personal Care Assistance Level of Assistance  Bathing, Feeding, Dressing Bathing Assistance: Limited assistance Feeding assistance: Independent Dressing Assistance: Limited  assistance     Functional Limitations Info  Sight, Hearing, Speech Sight Info: Adequate Hearing Info: Adequate Speech Info: Adequate    SPECIAL CARE FACTORS FREQUENCY  PT (By licensed PT), OT (By licensed OT)     PT Frequency: (5) OT Frequency: (5)            Contractures      Additional Factors Info  Code Status, Allergies Code Status Info: (Full Code. ) Allergies Info: (Losartan, Sitagliptin, Metformin And Related, Warfarin Sodium, Penicillins)           Current Medications (11/11/2017):  This is the current hospital active medication list Current Facility-Administered Medications  Medication Dose Route Frequency Provider Last Rate Last Dose  . 0.9 %  sodium chloride infusion   Intravenous Continuous Hessie Knows, MD 75 mL/hr at 11/11/17 1326    . [START ON 11/12/2017] acetaminophen (TYLENOL) tablet 325-650 mg  325-650 mg Oral Q6H PRN Hessie Knows, MD      . acetaminophen (TYLENOL) tablet 500 mg  500 mg Oral Q6H Hessie Knows, MD      . albuterol (PROVENTIL) (2.5 MG/3ML) 0.083% nebulizer solution 2.5 mg  2.5 mg Nebulization Q6H PRN Hessie Knows, MD      . bisacodyl (DULCOLAX) EC tablet 5 mg  5 mg Oral Daily PRN Hessie Knows, MD      . clindamycin (CLEOCIN) IVPB 600 mg  600 mg Intravenous Q6H Hessie Knows, MD 100 mL/hr at 11/11/17 1329 600 mg at 11/11/17 1329  . diphenhydrAMINE (BENADRYL) 12.5 MG/5ML elixir 12.5-25 mg  12.5-25 mg Oral Q4H PRN Hessie Knows, MD      .  docusate sodium (COLACE) capsule 100 mg  100 mg Oral BID Hessie Knows, MD   100 mg at 11/11/17 1220  . flecainide (TAMBOCOR) tablet 50 mg  50 mg Oral Q12H Hessie Knows, MD      . furosemide (LASIX) tablet 20 mg  20 mg Oral Daily PRN Hessie Knows, MD      . gabapentin (NEURONTIN) capsule 300 mg  300 mg Oral TID Hessie Knows, MD   300 mg at 11/11/17 1219  . [START ON 11/12/2017] glimepiride (AMARYL) tablet 4 mg  4 mg Oral Q breakfast Hessie Knows, MD      . hydrochlorothiazide (HYDRODIURIL) tablet 25  mg  25 mg Oral Daily Hessie Knows, MD      . HYDROcodone-acetaminophen (NORCO) 7.5-325 MG per tablet 1-2 tablet  1-2 tablet Oral Q4H PRN Hessie Knows, MD      . HYDROcodone-acetaminophen (NORCO/VICODIN) 5-325 MG per tablet 1-2 tablet  1-2 tablet Oral Q4H PRN Hessie Knows, MD   2 tablet at 11/11/17 1120  . hydrOXYzine (ATARAX/VISTARIL) tablet 25 mg  25 mg Oral TID PRN Hessie Knows, MD      . insulin aspart (novoLOG) injection 0-15 Units  0-15 Units Subcutaneous TID WC Hessie Knows, MD   2 Units at 11/11/17 1221  . magnesium citrate solution 1 Bottle  1 Bottle Oral Once PRN Hessie Knows, MD      . magnesium hydroxide (MILK OF MAGNESIA) suspension 30 mL  30 mL Oral Daily PRN Hessie Knows, MD      . menthol-cetylpyridinium (CEPACOL) lozenge 3 mg  1 lozenge Oral PRN Hessie Knows, MD       Or  . phenol (CHLORASEPTIC) mouth spray 1 spray  1 spray Mouth/Throat PRN Hessie Knows, MD      . methocarbamol (ROBAXIN) tablet 500 mg  500 mg Oral Q6H PRN Hessie Knows, MD       Or  . methocarbamol (ROBAXIN) 500 mg in dextrose 5 % 50 mL IVPB  500 mg Intravenous Q6H PRN Hessie Knows, MD      . metoCLOPramide (REGLAN) tablet 5-10 mg  5-10 mg Oral Q8H PRN Hessie Knows, MD       Or  . metoCLOPramide (REGLAN) injection 5-10 mg  5-10 mg Intravenous Q8H PRN Hessie Knows, MD      . Derrill Memo ON 11/12/2017] metoprolol succinate (TOPROL-XL) 24 hr tablet 50 mg  50 mg Oral Daily Hessie Knows, MD      . mometasone-formoterol [facility]) 200-5 MCG/ACT inhaler 2 puff  2 puff Inhalation BID Hessie Knows, MD      . morphine 2 MG/ML injection 0.5-1 mg  0.5-1 mg Intravenous Q2H PRN Hessie Knows, MD   0.5 mg at 11/11/17 1326  . multivitamin-lutein (OCUVITE-LUTEIN) capsule 1 capsule  1 capsule Oral Daily Hessie Knows, MD      . ondansetron [facility]) tablet 4 mg  4 mg Oral Q6H PRN Hessie Knows, MD       Or  . ondansetron [facility]) injection 4 mg  4 mg Intravenous Q6H PRN Hessie Knows, MD      . pantoprazole (PROTONIX) EC  tablet 40 mg  40 mg Oral BID Hessie Knows, MD      . Derrill Memo ON 11/12/2017] rivaroxaban (XARELTO) tablet 20 mg  20 mg Oral Q breakfast Hessie Knows, MD      . SUMAtriptan (IMITREX) tablet 25 mg  25 mg Oral Q2H PRN Hessie Knows, MD      . traMADol Veat[facility]bon) tablet 50 mg  50  mg Oral Q6H Hessie Knows, MD   50 mg at 11/11/17 1219  . zolpidem (AMBIEN) tablet 5 mg  5 mg Oral QHS PRN Hessie Knows, MD         Discharge Medications: Please see discharge summary for a list of discharge medications.  Relevant Imaging Results:  Relevant Lab Results:   Additional Information (SSN: [id number])  Gabrille Kilbride, Veronia Beets, LCSW

## 2017-11-12 LAB — BASIC METABOLIC PANEL
ANION GAP: 8 (ref 5–15)
BUN: 13 mg/dL (ref 6–20)
CHLORIDE: 98 mmol/L — AB (ref 101–111)
CO2: 25 mmol/L (ref 22–32)
Calcium: 7.9 mg/dL — ABNORMAL LOW (ref 8.9–10.3)
Creatinine, Ser: 0.85 mg/dL (ref 0.44–1.00)
GFR calc Af Amer: >60 mL/min (ref >60)
GFR calc non Af Amer: >60 mL/min (ref >60)
GLUCOSE: 186 mg/dL — AB (ref 65–99)
POTASSIUM: 3.9 mmol/L (ref 3.5–5.1)
Sodium: 131 mmol/L — ABNORMAL LOW (ref 135–145)

## 2017-11-12 LAB — GLUCOSE, CAPILLARY
GLUCOSE-CAPILLARY: 156 mg/dL — AB (ref 65–99)
GLUCOSE-CAPILLARY: 158 mg/dL — AB (ref 65–99)
Glucose-Capillary: 164 mg/dL — ABNORMAL HIGH (ref 65–99)
Glucose-Capillary: 208 mg/dL — ABNORMAL HIGH (ref 65–99)

## 2017-11-12 LAB — CBC
HEMATOCRIT: 33.2 % — AB (ref 35.0–47.0)
HEMOGLOBIN: 11.1 g/dL — AB (ref 12.0–16.0)
MCH: 29.6 pg (ref 26.0–34.0)
MCHC: 33.5 g/dL (ref 32.0–36.0)
MCV: 88.5 fL (ref 80.0–100.0)
Platelets: 243 10*3/uL (ref 150–440)
RBC: 3.75 MIL/uL — AB (ref 3.80–5.20)
RDW: 14.1 % (ref 11.5–14.5)
WBC: 11.3 10*3/uL — ABNORMAL HIGH (ref 3.6–11.0)

## 2017-11-12 NOTE — Clinical Social Work Note (Signed)
Clinical Social Work Assessment  Patient Details  Name: Mallory Arellano MRN: [id number] Date of Birth: 1940/07/09  Date of referral:  11/12/17               Reason for consult:  Facility Placement                Permission sought to share information with:  Chartered certified accountant granted to share information::  Yes, Verbal Permission Granted  Name::      [place]::   [place]   Relationship::     Contact Information:     Housing/Transportation Living arrangements for the past 2 months:  [place] of Information:  Patient, Partner Patient Interpreter Needed:    Criminal Activity/Legal Involvement Pertinent to Current Situation/Hospitalization:  No - Comment as needed Significant Relationships:  Adult Children Lives with:  Self Do you feel safe going back to the place where you live?  Yes Need for family participation in patient care:  Yes (Comment)  Care giving concerns:  Patient lives alone in [place].    Social Worker assessment / plan:  Holiday representative (CSW) received SNF consult. PT is recommending SNF. CSW met with patient alone at bedside to discuss D/C plan. Patient was alert and oriented X4 and was laying in the bed. CSW introduced self and explained role of CSW department. Patient reported that she lives alone and has 2 adult daughters. CSW explained SNF process and that [facility] will have to approve it. Patient is agreeable to SNF search in [facility] and prefers [place]. FL2 complete and faxed out.   CSW presented bed offers to patient and she chose [facility]. Per Seth Bake admissions coordinator at [facility] she will get [facility] SNF authorization when patient comes into the building. Per Seth Bake patient can come to [facility] over the weekend to room 326. CSW will continue to follow and assist as needed.   Employment status:  Retired Nurse, adult PT Recommendations:   [place] / Referral to community resources:  [place]  Patient/Family's Response to care:  Patient is agreeable to D/C to [facility].   Patient/Family's Understanding of and Emotional Response to Diagnosis, Current Treatment, and Prognosis:  Patient was very pleasant and thanked CSW for assistance.   Emotional Assessment Appearance:  Appears stated age Attitude/Demeanor/Rapport:    Affect (typically observed):  Accepting, Adaptable, Pleasant Orientation:  Oriented to Self, Oriented to Place, Oriented to  Time, Oriented to Situation Alcohol / Substance use:  Not Applicable Psych involvement (Current and /or in the community):  No (Comment)  Discharge Needs  Concerns to be addressed:  Discharge Planning Concerns Readmission within the last 30 days:  No Current discharge risk:  Dependent with Mobility Barriers to Discharge:  Continued Medical Work up   [employer], Veronia Beets, LCSW 11/12/2017, 10:57 AM

## 2017-11-12 NOTE — Progress Notes (Signed)
Physical Therapy Treatment Patient Details Name: Mallory Arellano MRN: [id number] DOB: 1941-04-12 Today's Date: 11/12/2017    History of Present Illness Pt is a 77 y/o F s/p direct anterior R THA.  Pt's PMH includes a-fib.    PT Comments    Pt limited by nausea this session. She requires min guard for supine>sit and min assist for sit>supine.  Min assist for sit>stand with bed elevated due to difficulty boosting to stand.  Cues for proper hand placement and safety with transfer. Pt ambulated 12 ft and ultimately needed to return to bed due to significant nausea.  RN notified of pt's request for something for nausea.  SNF remains most appropriate d/c plan at this time.     Follow Up Recommendations  SNF     Equipment Recommendations  Other (comment)(TBD at next venue of care)    Recommendations for Other Services       Precautions / Restrictions Precautions Precautions: Fall Precaution Comments: Direct anterior, no hip precautions Restrictions Weight Bearing Restrictions: Yes RLE Weight Bearing: Weight bearing as tolerated    Mobility  Bed Mobility Overal bed mobility: Needs Assistance Bed Mobility: Supine to Sit;Sit to Supine     Supine to sit: HOB elevated;Min guard Sit to supine: Min assist   General bed mobility comments: Pt required increased time and effort and uses LLE to assist RLE to EOB.  Heavy use of bed rail to pull into sitting.   Transfers Overall transfer level: Needs assistance Equipment used: Rolling walker (2 wheeled) Transfers: Sit to/from Stand Sit to Stand: From elevated surface;Min guard        Lateral/Scoot Transfers: Min assist General transfer comment: Bed elevated for ease with standing.  Cues for proper hand placement.  Pt demonstrates controlled eccentric lowering when sitting on BSC and on bed.   Ambulation/Gait Ambulation/Gait assistance: Min assist Gait Distance (Feet): 12 Feet Assistive device: Rolling walker (2 wheeled) Gait  Pattern/deviations: Step-to pattern;Decreased stride length;Decreased stance time - right;Decreased weight shift to right;Trunk flexed Gait velocity: decreased Gait velocity interpretation: <1.31 ft/sec, indicative of household ambulator General Gait Details: Cues for proper sequencing with RW and min assist to advance RW properly.  Pt with several standing rest breaks due to fatigue and pt reporting signfiicant nausea and ultimately needed to return to bed.    Stairs             Wheelchair Mobility    Modified Rankin (Stroke Patients Only)       Balance Overall balance assessment: Needs assistance Sitting-balance support: Feet supported;No upper extremity supported Sitting balance-Leahy Scale: Fair Sitting balance - Comments: Pt able to sit EOB without UE support but likely would lose her balance with perturbation   Standing balance support: Bilateral upper extremity supported;During functional activity Standing balance-Leahy Scale: Poor Standing balance comment: Pt relies on BUE support for static and dynamic activities                            Cognition Arousal/Alertness: Awake/alert Behavior During Therapy: WFL for tasks assessed/performed Overall Cognitive Status: Within Functional Limits for tasks assessed                                        Exercises Total Joint Exercises Ankle Circles/Pumps: AROM;Both;Supine;20 reps;Seated Quad Sets: Strengthening;Both;10 reps;Supine Gluteal Sets: Strengthening;Both;10 reps;Supine Hip ABduction/ADduction: AAROM;Right;10 reps;Supine Long Arc  Quad: Strengthening;Right;Seated;15 reps;AROM    General Comments        Pertinent Vitals/Pain Pain Assessment: Faces Pain Score: 9  Faces Pain Scale: Hurts whole lot Pain Location: R hip  Pain Descriptors / Indicators: Aching;Grimacing;Guarding;Moaning Pain Intervention(s): Limited activity within patient's tolerance;Monitored during  session;Repositioned;Premedicated before session    Home Living Family/patient expects to be discharged to:: Skilled nursing facility Living Arrangements: Children Available Help at Discharge: Family;Available PRN/intermittently(Son works during the day) Type of Home: House Home Access: Level entry   Home Layout: [place]: Environmental consultant - 4 wheels;Wheelchair - manual      Prior Function Level of Independence: Independent with assistive device(s)      Comments: Pt. was independent with ADLs, and IADLs, and medication management. Family assist with driving.   PT Goals (current goals can now be found in the care plan section) Acute Rehab PT Goals Patient Stated Goal: to go to rehab before home PT Goal Formulation: With patient Time For Goal Achievement: 11/25/17 Potential to Achieve Goals: Good Progress towards PT goals: Progressing toward goals(very modestly)    Frequency    BID      PT Plan Current plan remains appropriate    Co-evaluation              AM-PAC PT "6 Clicks" Daily Activity  Outcome Measure  Difficulty turning over in bed (including adjusting bedclothes, sheets and blankets)?: Unable Difficulty moving from lying on back to sitting on the side of the bed? : Unable Difficulty sitting down on and standing up from a chair with arms (e.g., wheelchair, bedside commode, etc,.)?: Unable Help needed moving to and from a bed to chair (including a wheelchair)?: A Little Help needed walking in hospital room?: A Little Help needed climbing 3-5 steps with a railing? : A Lot 6 Click Score: 11    End of Session Equipment Utilized During Treatment: Gait belt Activity Tolerance: Patient limited by pain;Patient limited by fatigue;Other (comment)(limited by nausea) Patient left: with call bell/phone within reach;in bed;with bed alarm set;with SCD's reapplied Nurse Communication: Mobility status;Other (comment)(pt requesting something for nausea) PT Visit  Diagnosis: Pain;Muscle weakness (generalized) (M62.81);Unsteadiness on feet (R26.81);Difficulty in walking, not elsewhere classified (R26.2) Pain - Right/Left: Right Pain - part of body: Hip     Time: 4818-5631 PT Time Calculation (min) (ACUTE ONLY): 37 min  Charges:  $Gait Training: 8-22 mins $Therapeutic Exercise: 8-22 mins                    G Codes:       Collie Siad PT, DPT 11/12/2017, 2:31 PM

## 2017-11-12 NOTE — Evaluation (Signed)
Occupational Therapy Evaluation Patient Details Name: Mallory Arellano MRN: [id number] DOB: 06-11-1940 Today's Date: 11/12/2017    History of Present Illness Pt is a 77 y/o F emale who was admitted to [facility] for direct anterior R THA.  Pt's PMH includes a-fib.   Clinical Impression   Pt. Presents with lethargy, weakness, limited activity tolerance, and limited functional mobility which limits her ability to complete basic ADL and IADL functioning. Pt. resides at home alone. Pt. Was independent with ADLs, and IADL functioning: including meal preparation, and medication management. Pt.'s son assists with transportation.  Pt. education was provided about A/E use for LE ADLs. Pt. could benefit from OT services for ADL training, A/E training, and pt. Education about home modification, and DME. Pt. would benefit from SNF level of care upon discharge. Pt. could benefit from follow-up OT services at discharge. Pt. Plans to go to [facility] upon discharge.    Follow Up Recommendations  SNF    Equipment Recommendations       Recommendations for Other Services       Precautions / Restrictions Precautions Precautions: Fall Precaution Comments: Direct anterior, no hip precautions Restrictions Weight Bearing Restrictions: Yes RLE Weight Bearing: Weight bearing as tolerated      Mobility Bed Mobility Overal bed mobility: Needs Assistance Bed Mobility: Supine to Sit     Supine to sit: HOB elevated;Min assist     General bed mobility comments: Pt. up in chair upon arrival.  Transfers Overall transfer level: Needs assistance Equipment used: Rolling walker (2 wheeled) Transfers: Sit to/from Stand Sit to Stand: Min assist;From elevated surface        Lateral/Scoot Transfers: Min assist General transfer comment: Per PT     Balance                         ADL either performed or assessed with clinical judgement   ADL Overall ADL's : Needs assistance/impaired Eating/Feeding:  Set up;Independent   Grooming: Set up;Minimal assistance   Upper Body Bathing: Set up;Minimal assistance   Lower Body Bathing: Set up;Maximal assistance   Upper Body Dressing : Set up;Minimal assistance   Lower Body Dressing: Set up;Maximal assistance                 General ADL Comments: Pt. education was provided about A/E use for LE ADLs.     Vision Baseline Vision/History: (Pt. has glasses at home. Reports blurriness without them.) Patient Visual Report: No change from baseline       Perception     Praxis      Pertinent Vitals/Pain Pain Assessment: 0-10 Pain Score: 9  Pain Location: R hip at end of session Pain Descriptors / Indicators: Aching Pain Intervention(s): Limited activity within patient's tolerance;Monitored during session;Patient requesting pain meds-RN notified     Hand Dominance Right   Extremity/Trunk Assessment Upper Extremity Assessment Upper Extremity Assessment: Generalized weakness(Limited ROM in the Left shoulder)           Communication Communication Communication: No difficulties   Cognition Arousal/Alertness: Awake/alert Behavior During Therapy: WFL for tasks assessed/performed Overall Cognitive Status: Within Functional Limits for tasks assessed                                     General Comments    Exercises   Shoulder Instructions      Home Living Family/patient expects to  be discharged to:: Skilled nursing facility Living Arrangements: Children Available Help at Discharge: Family;Available PRN/intermittently(Son works during the day) Type of Home: House Home Access: Level entry     Home Layout: One level     Bathroom Shower/Tub: Occupational psychologist: Standard     Home Equipment: Environmental consultant - 4 wheels;Wheelchair - manual          Prior Functioning/Environment Level of Independence: Independent with assistive device(s)        Comments: Pt. was independent with ADLs, and IADLs,  and medication management. Family assist with driving.        OT Problem List: Decreased strength;Decreased activity tolerance;Pain;Decreased knowledge of use of DME or AE      OT Treatment/Interventions: Self-care/ADL training;Therapeutic exercise;Patient/family education;DME and/or AE instruction;Therapeutic activities    OT Goals(Current goals can be found in the care plan section) Acute Rehab OT Goals Patient Stated Goal: to go to rehab before home  OT Frequency: Min 2X/week   Barriers to D/C:            Co-evaluation              AM-PAC PT "6 Clicks" Daily Activity     Outcome Measure Help from another person eating meals?: None Help from another person taking care of personal grooming?: A Little Help from another person toileting, which includes using toliet, bedpan, or urinal?: A Lot Help from another person bathing (including washing, rinsing, drying)?: A Lot Help from another person to put on and taking off regular upper body clothing?: A Little Help from another person to put on and taking off regular lower body clothing?: A Lot 6 Click Score: 16   End of Session    Activity Tolerance: Patient limited by lethargy Patient left: in chair;with call bell/phone within reach;with chair alarm set  OT Visit Diagnosis: Muscle weakness (generalized) (M62.81)                Time: 2025-4270 OT Time Calculation (min): 17 min Charges:  OT General Charges $OT Visit: 1 Visit OT Evaluation $OT Eval Low Complexity: 1 Low G-Codes:     Harrel Carina, MS, OTR/L Harrel Carina 11/12/2017, 12:47 PM

## 2017-11-12 NOTE — Progress Notes (Signed)
   Subjective: 1 Day Post-Op Procedure(s) (LRB): TOTAL HIP ARTHROPLASTY ANTERIOR APPROACH (Right) Patient reports pain as 8 on 0-10 scale.   Patient is well, and has had no acute complaints or problems Denies any CP, SOB, ABD pain. We will continue therapy today.  Plan is to go Skilled nursing facility after hospital stay.  Objective: Vital signs in last 24 hours: Temp:  [96 F (35.6 C)-99.3 F (37.4 C)] 99 F (37.2 C) (06/21 0452) Pulse Rate:  [47-71] 63 (06/21 0452) Resp:  [14-18] 17 (06/21 0014) BP: (121-154)/(50-111) 135/58 (06/21 0452) SpO2:  [92 %-100 %] 95 % (06/21 0452)  Intake/Output from previous day: 06/20 0701 - 06/21 0700 In: 2232.5 [P.O.:540; I.V.:1592.5; IV Piggyback:100] Out: 1250 [Urine:1050; Blood:200] Intake/Output this shift: No intake/output data recorded.  No results for input(s): HGB in the last 72 hours. No results for input(s): WBC, RBC, HCT, PLT in the last 72 hours. No results for input(s): NA, K, CL, CO2, BUN, CREATININE, GLUCOSE, CALCIUM in the last 72 hours. Recent Labs    11/11/17 0630  INR 0.90    EXAM General - Patient is Alert, Appropriate and Oriented Extremity - Neurovascular intact Sensation intact distally Intact pulses distally Dorsiflexion/Plantar flexion intact No cellulitis present Compartment soft Dressing - dressing C/D/I and no drainage, wound VAC is intact with no drainage. Motor Function - intact, moving foot and toes well on exam.   Past Medical History:  Diagnosis Date  . A-fib ([place])   . Arthritis   . Asthma   . Diabetes mellitus without complication ([place])   . Dysrhythmia   . GERD (gastroesophageal reflux disease)   . Hypercholesteremia   . Hypertension   . Lower extremity edema   . Palpitation   . UTI (urinary tract infection)     Assessment/Plan:   1 Day Post-Op Procedure(s) (LRB): TOTAL HIP ARTHROPLASTY ANTERIOR APPROACH (Right) Active Problems:   Primary localized osteoarthritis of right  hip  Estimated body mass index is 29.86 kg/m as calculated from the following:   Height as of this encounter: 5\' 6"  (1.676 m).   Weight as of this encounter: 83.9 kg (185 lb). Advance diet Up with therapy  Check labs this morning. Vital signs stable. Pain moderate but patient only taking low dose of pain medication.  She states she is tolerating medications well Care management to assist with discharge to skilled nursing facility   DVT Prophylaxis - Foot Pumps, TED hose and Xarelto Weight-Bearing as tolerated to Right leg   T. Rachelle Hora, PA-C [place] 11/12/2017, 8:06 AM

## 2017-11-12 NOTE — Clinical Social Work Placement (Signed)
   CLINICAL SOCIAL WORK PLACEMENT  NOTE  Date:  11/12/2017  Patient Details  Name: Mallory Arellano MRN: [id number] Date of Birth: 1941/01/06  Clinical Social Work is seeking post-discharge placement for this patient at the [place] level of care (*CSW will initial, date and re-position this form in  chart as items are completed):  Yes   Patient/family provided with [facility] Work Department's list of facilities offering this level of care within the geographic area requested by the patient (or if unable, by the patient's family).  Yes   Patient/family informed of their freedom to choose among providers that offer the needed level of care, that participate in Medicare, Medicaid or managed care program needed by the patient, have an available bed and are willing to accept the patient.  Yes   Patient/family informed of [facility]'s ownership interest in [facility] and [facility], as well as of the fact that they are under no obligation to receive care at these facilities.  PASRR submitted to EDS on 11/11/17     PASRR number received on 11/11/17     Existing PASRR number confirmed on       FL2 transmitted to all facilities in geographic area requested by pt/family on 11/11/17     FL2 transmitted to all facilities within larger geographic area on       Patient informed that his/her managed care company has contracts with or will negotiate with certain facilities, including the following:        Yes   Patient/family informed of bed offers received.  Patient chooses bed at [facility] )     Physician recommends and patient chooses bed at      Patient to be transferred to   on  .  Patient to be transferred to facility by       Patient family notified on   of transfer.  Name of family member notified:        PHYSICIAN       Additional Comment:    _______________________________________________ Yisell Sprunger, Veronia Beets, LCSW 11/12/2017,  10:12 AM

## 2017-11-12 NOTE — Progress Notes (Signed)
Physical Therapy Treatment Patient Details Name: Mallory Arellano MRN: [id number] DOB: Aug 27, 1940 Today's Date: 11/12/2017    History of Present Illness Pt is a 77 y/o F s/p direct anterior R THA.  Pt's PMH includes a-fib.   PT Comments    Ms. [place] continues to be limited due to pain but made progress with mobility this session.  Pt requires min assist for supine>sit and sit<>stand.  Pt ambulated 10 ft, limited by pain and fatigue.  SNF remains most appropriate d/c plan at this time.    Follow Up Recommendations  SNF     Equipment Recommendations  Other (comment)(TBD at next venue of care)    Recommendations for Other Services       Precautions / Restrictions Precautions Precautions: Fall Precaution Comments: Direct anterior, no hip precautions Restrictions Weight Bearing Restrictions: Yes RLE Weight Bearing: Weight bearing as tolerated    Mobility  Bed Mobility Overal bed mobility: Needs Assistance Bed Mobility: Supine to Sit     Supine to sit: HOB elevated;Min assist     General bed mobility comments: Cues for sequencing and min assist to bring RLE to EOB.  Pt uses bed rail with increased time and effort.   Transfers Overall transfer level: Needs assistance Equipment used: Rolling walker (2 wheeled) Transfers: Sit to/from Stand Sit to Stand: Min assist;From elevated surface         General transfer comment: Bed elevated for ease with standing.  Pt requires min assist to boost to standing.  Pt is slow to stand.  Poorly controlled descent to sit despite cues for proper technique and slower lowering.   Ambulation/Gait Ambulation/Gait assistance: Min guard Gait Distance (Feet): 10 Feet Assistive device: Rolling walker (2 wheeled) Gait Pattern/deviations: Step-to pattern;Decreased stride length;Decreased stance time - right;Decreased weight shift to right;Trunk flexed Gait velocity: decreased   General Gait Details: Cues provided for prper sequencing using RW.  Pt  with very little foot clearance BLE (R less than L).  Pt WBing heavily through RW with UEs.  Pt fatigues quickly after ambulating 10 ft.    Stairs             Wheelchair Mobility    Modified Rankin (Stroke Patients Only)       Balance Overall balance assessment: Needs assistance Sitting-balance support: Feet supported;No upper extremity supported Sitting balance-Leahy Scale: Fair Sitting balance - Comments: Pt able to sit EOB without UE support but likely would lose her balance with perturbation   Standing balance support: Bilateral upper extremity supported;During functional activity Standing balance-Leahy Scale: Poor Standing balance comment: Pt relies on BUE support for static and dynamic activities                            Cognition Arousal/Alertness: Awake/alert Behavior During Therapy: WFL for tasks assessed/performed Overall Cognitive Status: Within Functional Limits for tasks assessed                                        Exercises Total Joint Exercises Ankle Circles/Pumps: AROM;Both;10 reps;Supine Quad Sets: Strengthening;Both;10 reps;Supine Gluteal Sets: Strengthening;Both;10 reps;Supine Long Arc Quad: Strengthening;Right;5 reps;Seated    General Comments General comments (skin integrity, edema, etc.): BP in supine at start of session 122/64, in sitting EOB 123/108, taken again sitting EOB 129/60, sitting at end of session 123/50.  BP taken as pt reported mild dizziness  in sitting after supine>sit.       Pertinent Vitals/Pain Pain Assessment: 0-10 Pain Score: 9  Pain Location: R hip at end of session Pain Descriptors / Indicators: Aching;Grimacing;Guarding;Moaning Pain Intervention(s): Limited activity within patient's tolerance;Monitored during session;Repositioned;Premedicated before session    Home Living                      Prior Function            PT Goals (current goals can now be found in the care  plan section) Acute Rehab PT Goals Patient Stated Goal: to go to rehab before home PT Goal Formulation: With patient Time For Goal Achievement: 11/25/17 Potential to Achieve Goals: Good Progress towards PT goals: Progressing toward goals    Frequency    BID      PT Plan Current plan remains appropriate    Co-evaluation              AM-PAC PT "6 Clicks" Daily Activity  Outcome Measure  Difficulty turning over in bed (including adjusting bedclothes, sheets and blankets)?: Unable Difficulty moving from lying on back to sitting on the side of the bed? : Unable Difficulty sitting down on and standing up from a chair with arms (e.g., wheelchair, bedside commode, etc,.)?: Unable Help needed moving to and from a bed to chair (including a wheelchair)?: A Little Help needed walking in hospital room?: A Little Help needed climbing 3-5 steps with a railing? : A Lot 6 Click Score: 11    End of Session Equipment Utilized During Treatment: Gait belt Activity Tolerance: Patient limited by pain;Patient limited by fatigue Patient left: with call bell/phone within reach;in chair;with chair alarm set Nurse Communication: Mobility status;Other (comment)(BP readiness, pt dizzy sitting EOB) PT Visit Diagnosis: Pain;Muscle weakness (generalized) (M62.81);Unsteadiness on feet (R26.81);Difficulty in walking, not elsewhere classified (R26.2) Pain - Right/Left: Right Pain - part of body: Hip     Time: 0925-0958 PT Time Calculation (min) (ACUTE ONLY): 33 min  Charges:  $Gait Training: 8-22 mins $Therapeutic Exercise: 8-22 mins                    G Codes:       Collie Siad PT, DPT 11/12/2017, 12:19 PM

## 2017-11-13 LAB — BASIC METABOLIC PANEL
ANION GAP: 7 (ref 5–15)
BUN: 13 mg/dL (ref 6–20)
CHLORIDE: 95 mmol/L — AB (ref 101–111)
CO2: 27 mmol/L (ref 22–32)
Calcium: 8.2 mg/dL — ABNORMAL LOW (ref 8.9–10.3)
Creatinine, Ser: 0.93 mg/dL (ref 0.44–1.00)
GFR calc Af Amer: >60 mL/min (ref >60)
GFR, EST NON AFRICAN AMERICAN: 58 mL/min — AB (ref >60)
GLUCOSE: 166 mg/dL — AB (ref 65–99)
Potassium: 4 mmol/L (ref 3.5–5.1)
SODIUM: 129 mmol/L — AB (ref 135–145)

## 2017-11-13 LAB — CBC
HCT: 31.7 % — ABNORMAL LOW (ref 35.0–47.0)
HEMOGLOBIN: 10.9 g/dL — AB (ref 12.0–16.0)
MCH: 30.2 pg (ref 26.0–34.0)
MCHC: 34.3 g/dL (ref 32.0–36.0)
MCV: 87.9 fL (ref 80.0–100.0)
Platelets: 218 10*3/uL (ref 150–440)
RBC: 3.6 MIL/uL — ABNORMAL LOW (ref 3.80–5.20)
RDW: 14 % (ref 11.5–14.5)
WBC: 12.4 10*3/uL — AB (ref 3.6–11.0)

## 2017-11-13 LAB — GLUCOSE, CAPILLARY
GLUCOSE-CAPILLARY: 163 mg/dL — AB (ref 65–99)
Glucose-Capillary: 162 mg/dL — ABNORMAL HIGH (ref 65–99)

## 2017-11-13 MED ORDER — DOCUSATE SODIUM 100 MG PO CAPS: 100.0000 mg | ORAL_CAPSULE | Freq: Two times a day (BID) | ORAL | 0 refills | Status: DC

## 2017-11-13 MED ORDER — TRAMADOL HCL 50 MG PO TABS: 50.0000 mg | ORAL_TABLET | Freq: Four times a day (QID) | ORAL | 0 refills | Status: DC

## 2017-11-13 MED ORDER — BISACODYL 10 MG RE SUPP
10.0000 mg | Freq: Once | RECTAL | Status: AC
  Administered 2017-11-13: 10 mg via RECTAL
  Filled 2017-11-13: qty 1

## 2017-11-13 MED ORDER — METHOCARBAMOL 500 MG PO TABS: 500.0000 mg | ORAL_TABLET | Freq: Four times a day (QID) | ORAL | 0 refills | Status: DC | PRN

## 2017-11-13 MED ORDER — HYDROCODONE-ACETAMINOPHEN 5-325 MG PO TABS: 1.0000 | ORAL_TABLET | ORAL | 0 refills | Status: DC | PRN

## 2017-11-13 NOTE — Clinical Social Work Note (Signed)
The patient will discharge today to [facility] via non-emergent EMS. The patient and her sister are aware and in agreement. All documentation has been send via [facility] to [facility], and the discharge packet has been delivered including the hard script for narcotics. CSW is signing off. Please consult should additional needs arise.  Santiago Bumpers, MSW, Latanya Presser [phone]

## 2017-11-13 NOTE — Discharge Instructions (Signed)
ANTERIOR APPROACH TOTAL HIP REPLACEMENT POSTOPERATIVE DIRECTIONS   Hip Rehabilitation, Guidelines Following Surgery  The results of a hip operation are greatly improved after range of motion and muscle strengthening exercises. Follow all safety measures which are given to protect your hip. If any of these exercises cause increased pain or swelling in your joint, decrease the amount until you are comfortable again. Then slowly increase the exercises. Call your caregiver if you have problems or questions.   HOME CARE INSTRUCTIONS  Remove items at home which could result in a fall. This includes throw rugs or furniture in walking pathways.   ICE to the affected hip every three hours for 30 minutes at a time and then as needed for pain and swelling.  Continue to use ice on the hip for pain and swelling from surgery. You may notice swelling that will progress down to the foot and ankle.  This is normal after surgery.  Elevate the leg when you are not up walking on it.    Continue to use the breathing machine which will help keep your temperature down.  It is common for your temperature to cycle up and down following surgery, especially at night when you are not up moving around and exerting yourself.  The breathing machine keeps your lungs expanded and your temperature down.  Do not place pillow under knee, focus on keeping the knee straight while resting  DIET You may resume your previous home diet once your are discharged from the hospital.  DRESSING / WOUND CARE / SHOWERING Please remove provena negative pressure dressing on 11/20/2017 and apply honey comb dressing. Keep dressing clean and dry at all times.  ACTIVITY Walk with your walker as instructed. Use walker as long as suggested by your caregivers. Avoid periods of inactivity such as sitting longer than an hour when not asleep. This helps prevent blood clots.  You may resume a sexual relationship in one month or when given the OK by  your doctor.  You may return to work once you are cleared by your doctor.  Do not drive a car for 6 weeks or until released by you surgeon.  Do not drive while taking narcotics.  WEIGHT BEARING Weight bearing as tolerated. Use walker/cane as needed for at least 4 weeks post op.  POSTOPERATIVE CONSTIPATION PROTOCOL Constipation - defined medically as fewer than three stools per week and severe constipation as less than one stool per week.  One of the most common issues patients have following surgery is constipation.  Even if you have a regular bowel pattern at home, your normal regimen is likely to be disrupted due to multiple reasons following surgery.  Combination of anesthesia, postoperative narcotics, change in appetite and fluid intake all can affect your bowels.  In order to avoid complications following surgery, here are some recommendations in order to help you during your recovery period.  Colace (docusate) - Pick up an over-the-counter form of Colace or another stool softener and take twice a day as long as you are requiring postoperative pain medications.  Take with a full glass of water daily.  If you experience loose stools or diarrhea, hold the colace until you stool forms back up.  If your symptoms do not get better within 1 week or if they get worse, check with your doctor.  Dulcolax (bisacodyl) - Pick up over-the-counter and take as directed by the product packaging as needed to assist with the movement of your bowels.  Take with a full  glass of water.  Use this product as needed if not relieved by Colace only.  ° °MiraLax (polyethylene glycol) - Pick up over-the-counter to have on hand.  MiraLax is a solution that will increase the amount of water in your bowels to assist with bowel movements.  Take as directed and can mix with a glass of water, juice, soda, coffee, or tea.  Take if you go more than two days without a movement. °Do not use MiraLax more than once per day. Call your  doctor if you are still constipated or irregular after using this medication for 7 days in a row. ° °If you continue to have problems with postoperative constipation, please contact the office for further assistance and recommendations.  If you experience "the worst abdominal pain ever" or develop nausea or vomiting, please contact the office immediatly for further recommendations for treatment. ° °ITCHING ° If you experience itching with your medications, try taking only a single pain pill, or even half a pain pill at a time.  You can also use Benadryl over the counter for itching or also to help with sleep.  ° °TED HOSE STOCKINGS °Wear the elastic stockings on both legs for six weeks following surgery during the day but you may remove then at night for sleeping. ° °MEDICATIONS °See your medication summary on the “After Visit Summary” that the nursing staff will review with you prior to discharge.  You may have some home medications which will be placed on hold until you complete the course of blood thinner medication.  It is important for you to complete the blood thinner medication as prescribed by your surgeon.  Continue your approved medications as instructed at time of discharge. ° °PRECAUTIONS °If you experience chest pain or shortness of breath - call 911 immediately for transfer to the hospital emergency department.  °If you develop a fever greater that 101 F, purulent drainage from wound, increased redness or drainage from wound, foul odor from the wound/dressing, or calf pain - CONTACT YOUR SURGEON.   °                                                °FOLLOW-UP APPOINTMENTS °Make sure you keep all of your appointments after your operation with your surgeon and caregivers. You should call the office at the above phone number and make an appointment for approximately two weeks after the date of your surgery or on the date instructed by your surgeon outlined in the "After Visit Summary". ° °RANGE OF MOTION  AND STRENGTHENING EXERCISES  °These exercises are designed to help you keep full movement of your hip joint. Follow your caregiver's or physical therapist's instructions. Perform all exercises about fifteen times, three times per day or as directed. Exercise both hips, even if you have had only one joint replacement. These exercises can be done on a training (exercise) mat, on the floor, on a table or on a bed. Use whatever works the best and is most comfortable for you. Use music or television while you are exercising so that the exercises are a pleasant break in your day. This will make your life better with the exercises acting as a break in routine you can look forward to.  °Lying on your back, slowly slide your foot toward your buttocks, raising your knee up off the floor. Then slowly   slide your foot back down until your leg is straight again.  Lying on your back spread your legs as far apart as you can without causing discomfort.  Lying on your side, raise your upper leg and foot straight up from the floor as far as is comfortable. Slowly lower the leg and repeat.  Lying on your back, tighten up the muscle in the front of your thigh (quadriceps muscles). You can do this by keeping your leg straight and trying to raise your heel off the floor. This helps strengthen the largest muscle supporting your knee.  Lying on your back, tighten up the muscles of your buttocks both with the legs straight and with the knee bent at a comfortable angle while keeping your heel on the floor.   IF YOU ARE TRANSFERRED TO A SKILLED REHAB FACILITY If the patient is transferred to a skilled rehab facility following release from the hospital, a list of the current medications will be sent to the facility for the patient to continue.  When discharged from the skilled rehab facility, please have the facility set up the patient's [place] prior to being released. Also, the skilled facility will be responsible  for providing the patient with their medications at time of release from the facility to include their pain medication, the muscle relaxants, and their blood thinner medication. If the patient is still at the rehab facility at time of the two week follow up appointment, the skilled rehab facility will also need to assist the patient in arranging follow up appointment in our office and any transportation needs.  MAKE SURE YOU:  Understand these instructions.  Get help right away if you are not doing well or get worse.    Pick up stool softner and laxative for home use following surgery while on pain medications. Continue to use ice for pain and swelling after surgery. Do not use any lotions or creams on the incision until instructed by your surgeon.

## 2017-11-13 NOTE — Progress Notes (Signed)
IV removed, belongings packed, DC packet printed. EMS called for transport to [facility] room 326.

## 2017-11-13 NOTE — Progress Notes (Signed)
Orson Slick from [facility] called for report on patient. Advised her that we are waiting on a BM before we can transport.

## 2017-11-13 NOTE — Anesthesia Postprocedure Evaluation (Signed)
Anesthesia Post Note  Patient: Mallory Arellano  Procedure(s) Performed: TOTAL HIP ARTHROPLASTY ANTERIOR APPROACH (Right )  Patient location during evaluation: Nursing Unit Anesthesia Type: Spinal Level of consciousness: oriented and awake and alert Pain management: pain level controlled Vital Signs Assessment: post-procedure vital signs reviewed and stable Respiratory status: spontaneous breathing, respiratory function stable and patient connected to nasal cannula oxygen Cardiovascular status: blood pressure returned to baseline and stable Postop Assessment: no headache, no backache and no apparent nausea or vomiting Anesthetic complications: no     Last Vitals:  Vitals:   11/12/17 1632 11/13/17 0741  BP: 139/67 123/60  Pulse: 66 73  Resp:    Temp: 36.8 C 37.1 C  SpO2: 98% 98%    Last Pain:  Vitals:   11/13/17 0841  TempSrc:   PainSc: 7                  Precious Haws [name]

## 2017-11-13 NOTE — Progress Notes (Signed)
   Subjective: 2 Days Post-Op Procedure(s) (LRB): TOTAL HIP ARTHROPLASTY ANTERIOR APPROACH (Right) Patient reports pain as 8 on 0-10 scale.   Patient is well, and has had no acute complaints or problems Denies any CP, SOB, ABD pain. We will continue therapy today.  Plan is to go Skilled nursing facility after hospital stay.  Objective: Vital signs in last 24 hours: Temp:  [98.3 F (36.8 C)] 98.3 F (36.8 C) (06/21 1632) Pulse Rate:  [66] 66 (06/21 1632) BP: (121-139)/(55-67) 139/67 (06/21 1632) SpO2:  [96 %-98 %] 98 % (06/21 1632)  Intake/Output from previous day: 06/21 0701 - 06/22 0700 In: 1610 [P.O.:960; I.V.:825] Out: 300 [Urine:300] Intake/Output this shift: No intake/output data recorded.  Recent Labs    11/12/17 0854 11/13/17 0508  HGB 11.1* 10.9*   Recent Labs    11/12/17 0854 11/13/17 0508  WBC 11.3* 12.4*  RBC 3.75* 3.60*  HCT 33.2* 31.7*  PLT 243 218   Recent Labs    11/12/17 0854 11/13/17 0508  NA 131* 129*  K 3.9 4.0  CL 98* 95*  CO2 25 27  BUN 13 13  CREATININE 0.85 0.93  GLUCOSE 186* 166*  CALCIUM 7.9* 8.2*   Recent Labs    11/11/17 0630  INR 0.90    EXAM General - Patient is Alert, Appropriate and Oriented Extremity - Neurovascular intact Sensation intact distally Intact pulses distally Dorsiflexion/Plantar flexion intact No cellulitis present Compartment soft Dressing - dressing C/D/I and no drainage, wound VAC is intact with no drainage. Motor Function - intact, moving foot and toes well on exam.   Past Medical History:  Diagnosis Date  . A-fib ([place])   . Arthritis   . Asthma   . Diabetes mellitus without complication ([place])   . Dysrhythmia   . GERD (gastroesophageal reflux disease)   . Hypercholesteremia   . Hypertension   . Lower extremity edema   . Palpitation   . UTI (urinary tract infection)     Assessment/Plan:   2 Days Post-Op Procedure(s) (LRB): TOTAL HIP ARTHROPLASTY ANTERIOR APPROACH (Right) Active  Problems:   Primary localized osteoarthritis of right hip  Estimated body mass index is 29.86 kg/m as calculated from the following:   Height as of this encounter: 5\' 6"  (1.676 m).   Weight as of this encounter: 83.9 kg (185 lb). Advance diet Up with therapy  Labs and vital signs stable Discharge to [facility] today pending bowel movement  DVT Prophylaxis - Foot Pumps, TED hose and Xarelto Weight-Bearing as tolerated to Right leg   T. Rachelle Hora, PA-C [place] 11/13/2017, 7:32 AM

## 2017-11-13 NOTE — Progress Notes (Signed)
Physical Therapy Treatment Patient Details Name: Mallory Arellano MRN: [id number] DOB: [birth year] Today's Date: 11/13/2017    History of Present Illness Pt is a 77 y/o F s/p direct anterior R THA.  Pt's PMH includes a-fib.    PT Comments    Pt in bed ready for session on 3rd attempt.  Had received pain medication.  To edge of bed with increased time and mod a x 1.  Once sitting, able to remain upright without support. Pt with c/o dizziness that improved minimally with time.  Requesting to use bedpan but encouraged to try commode.  Stood with mod a x 1. She was able to transfer to commode at bedside with min a x 1.  Very slow small shuffling feet to turn but not true steps today.Voided but no BM. She provided her own self care.  Stood with mod a x 1 and was able to shuffle feet to bed.  While she remained standing at edge of bed for a minute, she was unable to progress gait.   Pt returned to supine with mod a x 1.  Pt awaiting nursing care for suppository so she remained in bed.   Follow Up Recommendations  SNF     Equipment Recommendations       Recommendations for Other Services       Precautions / Restrictions Precautions Precautions: Fall Precaution Comments: Direct anterior, no hip precautions Restrictions Weight Bearing Restrictions: Yes RLE Weight Bearing: Weight bearing as tolerated    Mobility  Bed Mobility Overal bed mobility: Needs Assistance Bed Mobility: Supine to Sit;Sit to Supine     Supine to sit: HOB elevated;Mod assist Sit to supine: Mod assist   General bed mobility comments: increased assist today  Transfers Overall transfer level: Needs assistance Equipment used: Rolling walker (2 wheeled) Transfers: Sit to/from Stand Sit to Stand: Mod assist         General transfer comment: Bed elevated for ease with standing.  Cues for proper hand placement.  Pt with poor controlled eccentric lowering when sitting on BSC and on bed.    Ambulation/Gait Ambulation/Gait assistance: Min assist Gait Distance (Feet): 3 Feet Assistive device: Rolling walker (2 wheeled) Gait Pattern/deviations: Step-to pattern;Decreased step length - right;Decreased step length - left Gait velocity: decreased       Stairs             Wheelchair Mobility    Modified Rankin (Stroke Patients Only)       Balance Overall balance assessment: Needs assistance Sitting-balance support: Feet supported;No upper extremity supported Sitting balance-Leahy Scale: Fair     Standing balance support: Bilateral upper extremity supported;During functional activity Standing balance-Leahy Scale: Poor Standing balance comment: Pt relies on BUE support for static and dynamic activities                            Cognition Arousal/Alertness: Awake/alert Behavior During Therapy: WFL for tasks assessed/performed Overall Cognitive Status: Within Functional Limits for tasks assessed                                        Exercises Other Exercises Other Exercises: standing R hip AROM attempts - little to no foot clearance with R hip/knee flexion, uanble to SLR    General Comments        Pertinent Vitals/Pain Pain Assessment: Faces Faces Pain  Scale: Hurts whole lot Pain Location: R hip  Pain Descriptors / Indicators: Aching;Grimacing;Guarding;Moaning Pain Intervention(s): Limited activity within patient's tolerance;Monitored during session;Premedicated before session    Home Living                      Prior Function            PT Goals (current goals can now be found in the care plan section) Progress towards PT goals: Not progressing toward goals - comment    Frequency    BID      PT Plan Current plan remains appropriate    Co-evaluation              AM-PAC PT "6 Clicks" Daily Activity  Outcome Measure  Difficulty turning over in bed (including adjusting bedclothes, sheets  and blankets)?: Unable Difficulty moving from lying on back to sitting on the side of the bed? : Unable Difficulty sitting down on and standing up from a chair with arms (e.g., wheelchair, bedside commode, etc,.)?: Unable Help needed moving to and from a bed to chair (including a wheelchair)?: A Lot Help needed walking in hospital room?: A Lot Help needed climbing 3-5 steps with a railing? : Total 6 Click Score: 8    End of Session Equipment Utilized During Treatment: Gait belt Activity Tolerance: Patient limited by pain Patient left: in bed;with bed alarm set;with call bell/phone within reach Nurse Communication: Mobility status;Other (comment) Pain - Right/Left: Right Pain - part of body: Hip     Time: 1006-1040 PT Time Calculation (min) (ACUTE ONLY): 34 min  Charges:  $Gait Training: 8-22 mins $Therapeutic Activity: 8-22 mins                    G Codes:       Chesley Noon, PTA 11/13/17, 11:16 AM

## 2017-11-13 NOTE — Progress Notes (Signed)
EMS here to transport patient

## 2017-11-13 NOTE — Discharge Summary (Signed)
Physician Discharge Summary  Patient ID: Mallory Arellano MRN: [id number] DOB/AGE: 06/20/1940 77 y.o.  Admit date: 11/11/2017 Discharge date: 11/13/2017  Admission Diagnoses:  primary osteoarthritis of right hip   Discharge Diagnoses: Patient Active Problem List   Diagnosis Date Noted  . Primary localized osteoarthritis of right hip 11/11/2017  . Symptomatic bradycardia 02/12/2017  . Chronic cough 06/18/2015  . Plantar fascial fibromatosis 06/18/2015  . Preventative health care 01/18/2015  . Paroxysmal atrial fibrillation ([place]) 12/30/2014  . Hypertension, essential, benign 10/16/2014  . Mild persistent chronic asthma without complication 35/32/9924  . Allergic rhinitis 10/16/2014  . GERD (gastroesophageal reflux disease) 10/16/2014  . HLD (hyperlipidemia)   . Arthritis of knee, degenerative 09/07/2014  . DM (diabetes mellitus), type 2, uncontrolled ([place]) 10/26/2012    Past Medical History:  Diagnosis Date  . A-fib ([place])   . Arthritis   . Asthma   . Diabetes mellitus without complication ([place])   . Dysrhythmia   . GERD (gastroesophageal reflux disease)   . Hypercholesteremia   . Hypertension   . Lower extremity edema   . Palpitation   . UTI (urinary tract infection)      Transfusion: none   Consultants (if any):   Discharged Condition: Improved  Hospital Course: Stpehanie A Arellano is an 77 y.o. female who was admitted 11/11/2017 with a diagnosis of right hip osteoarthritis and went to the operating room on 11/11/2017 and underwent the above named procedures.    Surgeries: Procedure(s): TOTAL HIP ARTHROPLASTY ANTERIOR APPROACH on 11/11/2017 Patient tolerated the surgery well. Taken to PACU where she was stabilized and then transferred to the orthopedic floor.  Started on Xarelto. Foot pumps applied bilaterally at 80 mm. Heels elevated on bed with rolled towels. No evidence of DVT. Negative Homan. Physical therapy started on day #1 for gait training and transfer. OT started day  #1 for ADL and assisted devices.  Patient's foley was d/c on day #1. Patient's IV  was d/c on day #2.  On post op day #2 patient was stable and ready for discharge to skilled nursing facility.   Patient needs to wear bilateral compression stockings daily, remove at nighttime for 6 weeks. Please remove provena negative pressure dressing on 11/20/2017 and apply honey comb dressing. Keep dressing clean and dry at all times. Follow-up with [facility] in 2 weeks for staple removal      Implants:  Medacta AMIS 1 standard stem with 50 mm Mpact DM cup with liner and M 28 mm metal head    She was given perioperative antibiotics:  Anti-infectives (From admission, onward)   Start     Dose/Rate Route Frequency Ordered Stop   11/11/17 1400  clindamycin (CLEOCIN) IVPB 600 mg     600 mg 100 mL/hr over 30 Minutes Intravenous Every 6 hours 11/11/17 1055 11/12/17 0302   11/11/17 0556  clindamycin (CLEOCIN) 900 MG/50ML IVPB    Note to Pharmacy:  Lorenza [place]   : cabinet override      11/11/17 0556 11/11/17 0744   11/10/17 2215  clindamycin (CLEOCIN) IVPB 900 mg     900 mg 100 mL/hr over 30 Minutes Intravenous  Once 11/10/17 2202 11/11/17 0844    .  She was given sequential compression devices, early ambulation, and Xarelto for DVT prophylaxis.  She benefited maximally from the hospital stay and there were no complications.    Recent vital signs:  Vitals:   11/12/17 0812 11/12/17 1632  BP: (!) 121/55 139/67  Pulse: 66 66  Resp:    Temp: 98.3 F (36.8 C) 98.3 F (36.8 C)  SpO2: 96% 98%    Recent laboratory studies:  Lab Results  Component Value Date   HGB 10.9 (L) 11/13/2017   HGB 11.1 (L) 11/12/2017   HGB 14.2 10/27/2017   Lab Results  Component Value Date   WBC 12.4 (H) 11/13/2017   PLT 218 11/13/2017   Lab Results  Component Value Date   INR 0.90 11/11/2017   Lab Results  Component Value Date   NA 129 (L) 11/13/2017   K 4.0 11/13/2017   CL 95 (L)  11/13/2017   CO2 27 11/13/2017   BUN 13 11/13/2017   CREATININE 0.93 11/13/2017   GLUCOSE 166 (H) 11/13/2017    Discharge Medications:   Allergies as of 11/13/2017      Reactions   Losartan Cough   Sitagliptin Swelling   ABDOMINAL SWELLING   Metformin And Related Other (See Comments)   Stomach swelling   Warfarin Sodium Other (See Comments)   Patient didn't know what happened but states she couldn't take it   Penicillins Itching   Has patient had a PCN reaction causing immediate rash, facial/tongue/throat swelling, SOB or lightheadedness with hypotension: No Has patient had a PCN reaction causing severe rash involving mucus membranes or skin necrosis: No Has patient had a PCN reaction that required hospitalization: No Has patient had a PCN reaction occurring within the last 10 years: No If all of the above answers are "NO", then may proceed with Cephalosporin use.      Medication List    TAKE these medications   ADVANCED DIABETIC MULTIVITAMIN PO Take 1 tablet by mouth daily.   albuterol (2.5 MG/3ML) 0.083% nebulizer solution Commonly known as:  PROVENTIL Take 2.5 mg by nebulization every 6 (six) hours as needed for wheezing or shortness of breath.   albuterol 108 (90 Base) MCG/ACT inhaler Commonly known as:  PROVENTIL HFA;VENTOLIN HFA Inhale 2 puffs into the lungs every 6 (six) hours as needed for wheezing or shortness of breath.   budesonide-formoterol 160-4.5 MCG/ACT inhaler Commonly known as:  SYMBICORT Inhale 2 puffs into the lungs 2 (two) times daily.   docusate sodium 100 MG capsule Commonly known as:  COLACE Take 1 capsule (100 mg total) by mouth 2 (two) times daily.   flecainide 50 MG tablet Commonly known as:  TAMBOCOR Take 1 tablet (50 mg total) by mouth every 12 (twelve) hours.   furosemide 20 MG tablet Commonly known as:  LASIX Take 1 tablet (20 mg total) by mouth daily. What changed:    when to take this  reasons to take this   glimepiride 4 MG  tablet Commonly known as:  AMARYL Take 4 mg by mouth daily with breakfast.   hydrochlorothiazide 25 MG tablet Commonly known as:  HYDRODIURIL Take 25 mg by mouth daily.   HYDROcodone-acetaminophen 5-325 MG tablet Commonly known as:  NORCO/VICODIN Take 1-2 tablets by mouth every 4 (four) hours as needed for moderate pain (pain score 4-6). What changed:    how much to take  when to take this  reasons to take this   hydrOXYzine 25 MG tablet Commonly known as:  ATARAX/VISTARIL Take 25 mg by mouth 3 (three) times daily as needed for itching.   methocarbamol 500 MG tablet Commonly known as:  ROBAXIN Take 1 tablet (500 mg total) by mouth every 6 (six) hours as needed for muscle spasms.   metoprolol succinate 50 MG 24 hr tablet Commonly known  as:  TOPROL-XL Take 50 mg by mouth daily. Take with or immediately following a meal.   milk thistle 175 MG tablet Take 175 mg by mouth daily.   pantoprazole 40 MG tablet Commonly known as:  PROTONIX Take 40 mg by mouth 2 (two) times daily.   rivaroxaban 20 MG Tabs tablet Commonly known as:  XARELTO Take 20 mg by mouth daily with breakfast.   SUMAtriptan 25 MG tablet Commonly known as:  IMITREX Take 1 tablet by mouth every 2 (two) hours as needed for migraine.   traMADol 50 MG tablet Commonly known as:  ULTRAM Take 1 tablet (50 mg total) by mouth every 6 (six) hours. What changed:    how much to take  when to take this  reasons to take this            Durable Medical Equipment  (From admission, onward)        Start     Ordered   11/11/17 1056  DME 3 n 1  Once     11/11/17 1055   11/11/17 1056  DME Walker rolling  Once    Question:  Patient needs a walker to treat with the following condition  Answer:  Status post total hip replacement, right   11/11/17 1055   11/11/17 1056  DME Bedside commode  Once    Question:  Patient needs a bedside commode to treat with the following condition  Answer:  Status post total hip  replacement, right   11/11/17 1055      Diagnostic Studies: Dg Hip Operative Unilat W Or W/o Pelvis Right  Result Date: 11/11/2017 CLINICAL DATA:  Elective surgery.  Right hip replacement. EXAM: OPERATIVE right HIP (WITH PELVIS IF PERFORMED) 3 VIEWS TECHNIQUE: Fluoroscopic spot image(s) were submitted for interpretation post-operatively. COMPARISON:  None. FINDINGS: Pre and post total hip replacement imaging was performed intraoperatively. AP view demonstrates right total hip arthroplasty. The hip appears to be located on this single view. The distal aspect of the femoral component is not imaged. IMPRESSION: Intraoperative imaging demonstrates no radiographic evidence for complication following right total hip arthroplasty. Of note, the distal aspect of the femoral component is not imaged. Recommend postoperative imaging to ensure that the femur is intact distal to the prosthesis. Electronically Signed   By: San Morelle M.D.   On: 11/11/2017 09:06   Dg Hip Unilat W Or W/o Pelvis 2-3 Views Right  Result Date: 11/11/2017 CLINICAL DATA:  Status post total hip replacement EXAM: DG HIP   2-3V RIGHT COMPARISON:  None. FINDINGS: Frontal and lateral views obtained. The patient is status post total hip replacement right with prosthetic components well-seated. No fracture or dislocation evident. Right sacroiliac joint appears normal. There is a drain in the right hip region as well as skin staples overlying. IMPRESSION: Total hip replacement right with prosthetic components well-seated. No fracture or dislocation. Electronically Signed   By: Lowella Grip III M.D.   On: 11/11/2017 09:46    Disposition:      Contact information for follow-up providers    Duanne Guess, PA-C Follow up in 2 week(s).   Specialties:  Orthopedic Surgery, Emergency Medicine Contact information: [address] [place] [address] [phone]            Contact information for after-discharge care     Destination    [facility] .   Service:  Skilled Nursing Contact information: [address] [place] [phone]  Signed: Dorise Hiss CHRISTOPHER 11/13/2017, 7:37 AM

## 2017-11-15 LAB — SURGICAL PATHOLOGY

## 2017-11-16 DIAGNOSIS — J45909 Unspecified asthma, uncomplicated: Secondary | ICD-10-CM | POA: Diagnosis not present

## 2017-11-16 DIAGNOSIS — I10 Essential (primary) hypertension: Secondary | ICD-10-CM | POA: Diagnosis not present

## 2017-11-16 DIAGNOSIS — M1610 Unilateral primary osteoarthritis, unspecified hip: Secondary | ICD-10-CM | POA: Diagnosis not present

## 2017-11-16 DIAGNOSIS — E119 Type 2 diabetes mellitus without complications: Secondary | ICD-10-CM | POA: Diagnosis not present

## 2017-11-16 DIAGNOSIS — K219 Gastro-esophageal reflux disease without esophagitis: Secondary | ICD-10-CM | POA: Diagnosis not present

## 2017-12-02 ENCOUNTER — Telehealth: Payer: Self-pay

## 2017-12-02 NOTE — Telephone Encounter (Signed)
Copied from [place] [id number]. Topic: Inquiry >> Dec 02, 2017  3:19 PM Pricilla Handler wrote: Reason for CRM: Sherlynn Stalls with [facility] [phone]) called requesting verbal OT orders. Requested: OT Once a Week for 1 Week, Twice a Week for 2 Weeks. Please call Sherlynn Stalls at [phone].       Thank You!!!

## 2017-12-02 NOTE — Telephone Encounter (Signed)
Not a pt at [facility] and I called Dr Iona Beard office and spoke with Sula Soda and pt has not see Dr Iona Beard. Spoke with Anne Ng at [facility] and advised to send back to [place].

## 2017-12-30 ENCOUNTER — Ambulatory Visit: Payer: Medicare Other

## 2018-01-03 ENCOUNTER — Ambulatory Visit: Payer: Medicare Other | Attending: Orthopedic Surgery

## 2018-01-03 ENCOUNTER — Ambulatory Visit: Payer: Medicare Other

## 2018-01-03 DIAGNOSIS — M25551 Pain in right hip: Secondary | ICD-10-CM

## 2018-01-03 DIAGNOSIS — G8929 Other chronic pain: Secondary | ICD-10-CM

## 2018-01-03 DIAGNOSIS — M545 Low back pain: Secondary | ICD-10-CM | POA: Diagnosis present

## 2018-01-03 DIAGNOSIS — R262 Difficulty in walking, not elsewhere classified: Secondary | ICD-10-CM | POA: Diagnosis present

## 2018-01-03 DIAGNOSIS — M6281 Muscle weakness (generalized): Secondary | ICD-10-CM

## 2018-01-03 NOTE — Therapy (Signed)
[facility] [address], [place], [address] Phone: [phone]   Fax:  [phone]  Physical Therapy Evaluation  Patient Details  Name: Mallory Arellano MRN: [id number] Date of Birth: 06/11/1940 Referring Provider: Hessie Knows, MD   Encounter Date: 01/03/2018  PT End of Session - 01/03/18 1119    Visit Number  1    Number of Visits  17    Date for PT Re-Evaluation  03/03/18    Authorization Type  1    Authorization Time Period  of 10 progress report    PT Start Time  1119   pt arrived late   PT Stop Time  1211    PT Time Calculation (min)  52 min    Equipment Utilized During Treatment  --   pt rw   Activity Tolerance  Patient tolerated treatment well    Behavior During Therapy  WFL for tasks assessed/performed       Past Medical History:  Diagnosis Date  . A-fib ([place])   . Arthritis   . Asthma   . Diabetes mellitus without complication ([place])   . Dysrhythmia   . GERD (gastroesophageal reflux disease)   . Hypercholesteremia   . Hypertension   . Lower extremity edema   . Palpitation   . UTI (urinary tract infection)     Past Surgical History:  Procedure Laterality Date  . 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 WISDOM TOOTH EXTRACTION      There were no vitals filed for this visit.   Subjective Assessment - 01/03/18 1125    Subjective  R hip pain: 5/10 currently (pt sitting), 7/10 at most for the past 5 weeks. Back pain: 5/10 currently (pt sitting, took pain medication), 8/10 at most for the past month.     Pertinent History  S/P R THA on 11/11/2017 (anterior approach). Pt fell and developed R hip joint pain which became arthritis. 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  Had home health treatment in which last Friday was her  last day.   Currently has a difficult time walking.  Hips feel heavy when trying to stand up.  Pt also states states having low back and R knee pain. Feels like her R hip, low back and R knee are working together.  Had a shot for her low back 2 weeks ago which helped. Going to have a shot for her R knee soon.   No falls within the past 6 months. Has a fear of falling .     Patient Stated Goals  Walk better.     Currently in Pain?  Yes    Pain Score  5     Pain Location  Hip    Pain Orientation  Right    Pain Descriptors / Indicators  Aching;Dull    Pain Type  Chronic pain;Surgical pain    Pain Onset  More than a month ago    Pain Frequency  Constant    Aggravating Factors   Difficulty with: walking about 60 ft tires her out, standing up from a chair. Sitting too long causes stiffness (after about 15-20 minutes). Back: bending over, standing up straight.  Standing and washing dishes.     Pain Relieving Factors  Back: heating pad, tylenol  [facility] PT Assessment - 01/03/18 1122      Assessment   Medical Diagnosis  S/P R THA     Referring Provider  Hessie Knows, MD    Onset Date/Surgical Date  11/11/17    Prior Therapy  Had PT at [facility] a while ago which helped.       Precautions   Precaution Comments  Anterior hip precautions      Restrictions   Other Position/Activity Restrictions  WBAT      Balance Screen   Has the patient fallen in the past 6 months  No    Has the patient had a decrease in activity level because of a fear of falling?   Yes    Is the patient reluctant to leave their home because of a fear of falling?   No      Prior Function   Vocation Requirements  PLOF: Difficulty ambulating, peforming standing tasks       Observation/Other Assessments   Observations  10 Meter walk test 0.36 m/s average with rw    Skin Integrity  surgical incision healed      Posture/Postural Control   Posture Comments  Bilaterally protracted shoulders and neck, decreased R  LE weight bearing, decreased R hip extension      Strength   Right Hip Flexion  2+/5    Right Hip Extension  4-/5   seated hip extension isometric   Right Hip ABduction  2+/5    Left Hip Flexion  4/5    Left Hip Extension  4/5   seated hip extension isometric   Right Knee Flexion  4/5    Right Knee Extension  4+/5    Left Knee Flexion  4+/5    Left Knee Extension  5/5      Palpation   Palpation comment  R lumbar paraspinal muscle tension       Ambulation/Gait   Gait Comments  Pt ambulates with a rw, antalgic, decreased stance R LE, R femoral internal rotation and adduction during R LE stance phase of gait.                 Objective measurements completed on examination: See above findings.   10 Meter walk test with rw  29 seconds (0.34 m/s), 26 seconds (0.58m/s)  0.36 m/s average  surgical incision healed.  Palpation: R lumbar paraspinal muscle tension.    Therapeutic Exerecise  Seated R hip flexion AAROM to promote swing phase of gait 5x4 to 90 degrees   Seated R hip extension isometrics 10x5 seconds for 3 sets to promote glute muscle strengthening   Decreased back pain  Gait with rw with glute muscle squeeze during R LE stance phase. Decreased R hip pain. 50 ft   Improved exercise technique, movement at target joints, use of target muscles after mod verbal, visual, tactile cues.     Patient is a 77 year old female who came to physical therapy S/P R THA (anterior approach) on 11/11/2017. She also presents with R LE weakness, decreased femoral control, limited R hip AROM, hip and back pain, and difficulty performing functional tasks such as walking, standing up from sitting, and standing to wash dishes. Patient will benefit from skilled physical therapy services to address the aforementioned deficits.     PT Education - 01/03/18 1849    Education Details  ther-ex, plan of care    Person(s) Educated  Patient    Methods  Explanation;Demonstration;Tactile  cues;Verbal cues  Comprehension  Returned demonstration;Verbalized understanding       PT Short Term Goals - 01/03/18 1814      PT SHORT TERM GOAL #1   Title  Patient will be independent with her HEP to improve LE strength and ability to ambulate and perform standing tasks.     Time  3    Period  Weeks    Status  New    Target Date  01/27/18         PT Long Term Goals - 01/03/18 1814      PT LONG TERM GOAL #1   Title  Patient will improve R hip strength by at least 1/2 MMT grade to promote ability to ambulate and perform standing tasks.     Time  8    Period  Weeks    Status  New    Target Date  03/03/18      PT LONG TERM GOAL #2   Title  Patient will be able to ambulate at least 300 ft with [facility] to promote mobility    Baseline  Pt currently ambulates with rw (01/03/2018)    Time  8    Period  Weeks    Status  New    Target Date  03/03/18      PT LONG TERM GOAL #3   Title  Patient will be able to ambulate at least 100 ft independently to promote mobility.     Baseline  Pt currently ambulates with rw. (01/03/2018)    Time  8    Period  Weeks    Status  New    Target Date  03/03/18      PT LONG TERM GOAL #4   Title  Patient will be able to improve 10 Meter Walk Test measurement to at least 0.8 m/s with least restrictive AD to promote community ambulation.     Baseline  0.36 m/s with rw (01/03/2018)    Time  8    Period  Weeks    Status  New    Target Date  03/03/18      PT LONG TERM GOAL #5   Title  Patient will have a decrease in back pain to 5/10 or less at worst to promote ability to perform standing tasks.     Baseline  8/10 back pain at worst for the past month (01/03/2018)    Time  8    Period  Weeks    Status  New    Target Date  03/03/18             Plan - 01/03/18 1241    Clinical Impression Statement  Patient is a 77 year old female who came to physical therapy S/P R THA (anterior approach) on 11/11/2017. She also presents with R LE weakness,  decreased femoral control, limited R hip AROM, hip and back pain, and difficulty performing functional tasks such as walking, standing up from sitting, and standing to wash dishes. Patient will benefit from skilled physical therapy services to address the aforementioned deficits.     History and Personal Factors relevant to plan of care:  S/P R THA on 11/11/2017. Multiple areas of pain (R hip, R knee, low back), weakness, difficulty walking, performing standing tasks, decreased endurance.  Support from son and friend (rides)    Clinical Presentation  Stable    Clinical Presentation due to:  Pt seems to be improving since surgery based on subjective reports.     Clinical Decision  Making  Low    Rehab Potential  Fair    Clinical Impairments Affecting Rehab Potential  (-) pain, weakness, age; (+) motivated    PT Frequency  2x / week    PT Duration  8 weeks    PT Treatment/Interventions  Therapeutic activities;Therapeutic exercise;Balance training;Neuromuscular re-education;Patient/family education;Manual techniques;Aquatic Therapy;Electrical Stimulation;Iontophoresis 4mg /ml Dexamethasone;Gait training    PT Next Visit Plan  glute strengthening, femoral control, gait, trunk strengthening, manual techniques, modalities PRN    Consulted and Agree with Plan of Care  Patient       Patient will benefit from skilled therapeutic intervention in order to improve the following deficits and impairments:  Postural dysfunction, Pain, Improper body mechanics, Decreased strength, Difficulty walking, Decreased range of motion  Visit Diagnosis: Pain in right hip - Plan: PT plan of care cert/re-cert  Muscle weakness (generalized) - Plan: PT plan of care cert/re-cert  Difficulty in walking, not elsewhere classified - Plan: PT plan of care cert/re-cert  Chronic bilateral low back pain, with sciatica presence unspecified - Plan: PT plan of care cert/re-cert     Problem List Patient Active Problem List    Diagnosis Date Noted  . Primary localized osteoarthritis of right hip 11/11/2017  . Symptomatic bradycardia 02/12/2017  . Chronic cough 06/18/2015  . Plantar fascial fibromatosis 06/18/2015  . Preventative health care 01/18/2015  . Paroxysmal atrial fibrillation ([place]) 12/30/2014  . Hypertension, essential, benign 10/16/2014  . Mild persistent chronic asthma without complication 41/32/4401  . Allergic rhinitis 10/16/2014  . GERD (gastroesophageal reflux disease) 10/16/2014  . HLD (hyperlipidemia)   . Arthritis of knee, degenerative 09/07/2014  . DM (diabetes mellitus), type 2, uncontrolled ([place]) 10/26/2012    Joneen Boers PT, DPT   01/03/2018, 6:56 PM  [facility] [address] [place], [address] Phone: [phone]   Fax:  [phone]  Name: Mallory Arellano MRN: [id number] Date of Birth: 16-Dec-1940

## 2018-01-05 ENCOUNTER — Ambulatory Visit: Payer: Medicare Other

## 2018-01-05 DIAGNOSIS — M6281 Muscle weakness (generalized): Secondary | ICD-10-CM

## 2018-01-05 DIAGNOSIS — M25551 Pain in right hip: Secondary | ICD-10-CM | POA: Diagnosis not present

## 2018-01-05 DIAGNOSIS — R262 Difficulty in walking, not elsewhere classified: Secondary | ICD-10-CM

## 2018-01-05 DIAGNOSIS — M545 Low back pain: Secondary | ICD-10-CM

## 2018-01-05 DIAGNOSIS — G8929 Other chronic pain: Secondary | ICD-10-CM

## 2018-01-05 NOTE — Therapy (Signed)
[facility] [address] [place], [address] Phone: [phone]   Fax:  [phone]  Physical Therapy Treatment  Patient Details  Name: Mallory Arellano MRN: [id number] Date of Birth: 11/23/1940 Referring Provider: Hessie Knows, MD   Encounter Date: 01/05/2018  PT End of Session - 01/05/18 1310    Visit Number  2    Number of Visits  17    Date for PT Re-Evaluation  03/03/18    Authorization Type  2    Authorization Time Period  of 10 progress report    PT Start Time  1310   pt arrived late   PT Stop Time  1356    PT Time Calculation (min)  46 min    Equipment Utilized During Treatment  --   pt rw   Activity Tolerance  Patient tolerated treatment well    Behavior During Therapy  WFL for tasks assessed/performed       Past Medical History:  Diagnosis Date  . A-fib ([place])   . Arthritis   . Asthma   . Diabetes mellitus without complication ([place])   . Dysrhythmia   . GERD (gastroesophageal reflux disease)   . Hypercholesteremia   . Hypertension   . Lower extremity edema   . Palpitation   . UTI (urinary tract infection)     Past Surgical History:  Procedure Laterality Date  . 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 WISDOM TOOTH EXTRACTION      There were no vitals filed for this visit.  Subjective Assessment - 01/05/18 1313    Subjective  R hip feels sore in the joint.     Pertinent History  S/P R THA on 11/11/2017 (anterior approach). Pt fell and developed R hip joint pain which became arthritis. 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  Had home health treatment in which last Friday was her last day.   Currently has a difficult time walking.  Hips feel heavy when trying to stand up.  Pt also states states having low back and R knee  pain. Feels like her R hip, low back and R knee are working together.  Had a shot for her low back 2 weeks ago which helped. Going to have a shot for her R knee soon.   No falls within the past 6 months. Has a fear of falling .     Patient Stated Goals  Walk better.     Currently in Pain?  Yes    Pain Score  --   no pain level provided   Pain Onset  More than a month ago                               PT Education - 01/05/18 1351    Education Details  ther-ex, HEP    Person(s) Educated  Patient    Methods  Explanation;Demonstration;Tactile cues;Verbal cues    Comprehension  Returned demonstration;Verbalized understanding         Objectives  No latex band allergies  8 weeks post op  Therapeutic Exerecise   Gait with rw with glute muscle squeeze during R LE stance phase. Decreased R hip pain. 50 ft  Sit <> stand from 25 inch high mat  table with emphasis on femoral control 5x3.    Side stepping with bilateral UE assist at treadmill bars 5x each direction  standing glute max squeeze 10x5 seconds for 3 sets  Standing with bilateral UE assist, L toe taps onto treadmill platform to promote R LE strength to support herself in standing 10x2  Forward step up onto Air Ex pad with bilateral UE assist from rw 5x2 with R LE   Seated R hip extension isometrics 10x5 seconds for 3 sets to promote glute muscle strengthening   Seated bilateral shoulder extension isometrics, hands on thighs to promote trunk strengthening and decrease back pain 10x5 seconds for 3 sets   Decreased back ache and tightness afterwards   Improved exercise technique, movement at target joints, use of target muscles after mod verbal, visual, tactile cues.    Worked on R LE strengthening, especially her glute muscles, and femoral control to promote ability to ambulate and with decreased R hip discomfort. Also worked on trunk strengthening to help decrease lumbar paraspinal muscle tension.  Decreased back pain afterwards. Improved femoral control with sit <> stands and forward step up onto Air Ex pad after cues.Pt tolerated session well without aggravation of symptoms.           PT Short Term Goals - 01/03/18 1814      PT SHORT TERM GOAL #1   Title  Patient will be independent with her HEP to improve LE strength and ability to ambulate and perform standing tasks.     Time  3    Period  Weeks    Status  New    Target Date  01/27/18        PT Long Term Goals - 01/03/18 1814      PT LONG TERM GOAL #1   Title  Patient will improve R hip strength by at least 1/2 MMT grade to promote ability to ambulate and perform standing tasks.     Time  8    Period  Weeks    Status  New    Target Date  03/03/18      PT LONG TERM GOAL #2   Title  Patient will be able to ambulate at least 300 ft with [facility] to promote mobility    Baseline  Pt currently ambulates with rw (01/03/2018)    Time  8    Period  Weeks    Status  New    Target Date  03/03/18      PT LONG TERM GOAL #3   Title  Patient will be able to ambulate at least 100 ft independently to promote mobility.     Baseline  Pt currently ambulates with rw. (01/03/2018)    Time  8    Period  Weeks    Status  New    Target Date  03/03/18      PT LONG TERM GOAL #4   Title  Patient will be able to improve 10 Meter Walk Test measurement to at least 0.8 m/s with least restrictive AD to promote community ambulation.     Baseline  0.36 m/s with rw (01/03/2018)    Time  8    Period  Weeks    Status  New    Target Date  03/03/18      PT LONG TERM GOAL #5   Title  Patient will have a decrease in back pain to 5/10 or less at worst to promote ability to perform standing tasks.  Baseline  8/10 back pain at worst for the past month (01/03/2018)    Time  8    Period  Weeks    Status  New    Target Date  03/03/18            Plan - 01/05/18 1353    Clinical Impression Statement  Worked on R LE strengthening,  especially her glute muscles, and femoral control to promote ability to ambulate and with decreased R hip discomfort. Also worked on trunk strengthening to help decrease lumbar paraspinal muscle tension. Decreased back pain afterwards. Improved femoral control with sit <> stands and forward step up onto Air Ex pad after cues.Pt tolerated session well without aggravation of symptoms.     Rehab Potential  Fair    Clinical Impairments Affecting Rehab Potential  (-) pain, weakness, age; (+) motivated    PT Frequency  2x / week    PT Duration  8 weeks    PT Treatment/Interventions  Therapeutic activities;Therapeutic exercise;Balance training;Neuromuscular re-education;Patient/family education;Manual techniques;Aquatic Therapy;Electrical Stimulation;Iontophoresis 4mg /ml Dexamethasone;Gait training    PT Next Visit Plan  glute strengthening, femoral control, gait, trunk strengthening, manual techniques, modalities PRN    Consulted and Agree with Plan of Care  Patient       Patient will benefit from skilled therapeutic intervention in order to improve the following deficits and impairments:  Postural dysfunction, Pain, Improper body mechanics, Decreased strength, Difficulty walking, Decreased range of motion  Visit Diagnosis: Pain in right hip  Muscle weakness (generalized)  Difficulty in walking, not elsewhere classified  Chronic bilateral low back pain, with sciatica presence unspecified     Problem List Patient Active Problem List   Diagnosis Date Noted  . Primary localized osteoarthritis of right hip 11/11/2017  . Symptomatic bradycardia 02/12/2017  . Chronic cough 06/18/2015  . Plantar fascial fibromatosis 06/18/2015  . Preventative health care 01/18/2015  . Paroxysmal atrial fibrillation ([place]) 12/30/2014  . Hypertension, essential, benign 10/16/2014  . Mild persistent chronic asthma without complication 92/92/4462  . Allergic rhinitis 10/16/2014  . GERD (gastroesophageal reflux  disease) 10/16/2014  . HLD (hyperlipidemia)   . Arthritis of knee, degenerative 09/07/2014  . DM (diabetes mellitus), type 2, uncontrolled ([place]) 10/26/2012    Joneen Boers PT, DPT   01/05/2018, 2:09 PM  [facility] [address] [place], [address] Phone: [phone]   Fax:  [phone]  Name: WILLADEEN COLANTUONO MRN: [id number] Date of Birth: February 08, 1941

## 2018-01-05 NOTE — Patient Instructions (Addendum)
  Standing and holding onto something sturdy for support    Squeeze your rear end muscles together for 5 seconds.     Repeat 10 times   Perform 3 sets daily.      Sitting on your bed   Stand up using your hands, keeping your thighs in neutral    Repeat 5 times   Perform 3 sets daily      Seated bilateral shoulder extension isometrics  Sitting on a chair   Gently pull your belly button in.    Gently press your hands on your thighs to feel your trunk muscles activate.    Hold for 5 seconds.    Repeat 10 times.   Perform 3 sets daily.

## 2018-01-11 ENCOUNTER — Ambulatory Visit: Payer: Medicare Other

## 2018-01-11 DIAGNOSIS — M545 Low back pain: Secondary | ICD-10-CM

## 2018-01-11 DIAGNOSIS — R262 Difficulty in walking, not elsewhere classified: Secondary | ICD-10-CM

## 2018-01-11 DIAGNOSIS — G8929 Other chronic pain: Secondary | ICD-10-CM

## 2018-01-11 DIAGNOSIS — M25551 Pain in right hip: Secondary | ICD-10-CM

## 2018-01-11 DIAGNOSIS — M6281 Muscle weakness (generalized): Secondary | ICD-10-CM

## 2018-01-11 NOTE — Therapy (Signed)
[facility] [address] [place], [address] Phone: [phone]   Fax:  [phone]  Physical Therapy Treatment  Patient Details  Name: Mallory Arellano MRN: [id number] Date of Birth: 09-29-1940 Referring Provider: Hessie Knows, MD   Encounter Date: 01/11/2018  PT End of Session - 01/11/18 0904    Visit Number  3    Number of Visits  17    Date for PT Re-Evaluation  03/03/18    Authorization Type  3    Authorization Time Period  of 10 progress report    PT Start Time  0904    PT Stop Time  0957    PT Time Calculation (min)  53 min    Equipment Utilized During Treatment  --   pt rw   Activity Tolerance  Patient tolerated treatment well    Behavior During Therapy  [facility] for tasks assessed/performed       Past Medical History:  Diagnosis Date  . A-fib ([place])   . Arthritis   . Asthma   . Diabetes mellitus without complication ([place])   . Dysrhythmia   . GERD (gastroesophageal reflux disease)   . Hypercholesteremia   . Hypertension   . Lower extremity edema   . Palpitation   . UTI (urinary tract infection)     Past Surgical History:  Procedure Laterality Date  . 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 WISDOM TOOTH EXTRACTION      There were no vitals filed for this visit.  Subjective Assessment - 01/11/18 0908    Subjective  R hip is feeling so so. 5/10 R hip currently.  Back is ok. Pt states using E-stim pads help her back.     Pertinent History  S/P R THA on 11/11/2017 (anterior approach). Pt fell and developed R hip joint pain which became arthritis. 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  Had home health treatment in which last Friday was her last day.   Currently has a difficult time walking.  Hips feel heavy when trying to stand up.   Pt also states states having low back and R knee pain. Feels like her R hip, low back and R knee are working together.  Had a shot for her low back 2 weeks ago which helped. Going to have a shot for her R knee soon.   No falls within the past 6 months. Has a fear of falling .     Patient Stated Goals  Walk better.     Currently in Pain?  Yes    Pain Score  5     Pain Orientation  Right    Pain Onset  More than a month ago                               PT Education - 01/11/18 0910    Education Details  ther-ex    Person(s) Educated  Patient    Methods  Explanation;Demonstration;Tactile cues;Verbal cues    Comprehension  Returned demonstration;Verbalized understanding        Objectives  No latex band allergies  8 - 9 weeks post op  Therapeutic Exerecise  Sit to stand from regular chair with bilateral UE assist, cues for proper foot  and hand placement, femoral control   Gait with rw with glute muscle squeeze during R LE stance phase. Decreased R hip pain. 50 ft  Seated glute max squeeze 10x5 seconds   Seated bilateral shoulder extension isometrics, hands on thighs to promote trunk strengthening and decrease back pain 10x5 seconds for 3 sets   Sit <> stand from 25 inch high mat table with emphasis on femoral control 10x2 with and without UE assist to promote LE strengthening  standing glute max squeeze 10x5 seconds for 2 sets  Side stepping with bilateral UE assist at treadmill bars 5x each direction to promote glute med muscle strengthening.   Forward step up onto Air Ex pad with bilateral UE assist 10x2 with R LE  Standing with bilateral UE light touch assist, L toe taps onto treadmill platform to promote R LE strength to support herself in standing 10x.    Seated R knee extension (during E-stim) with 2 lb weight to promote quadricep muscle strength 10x    Improved exercise technique, movement at target joints, use of target muscles after  min to mod verbal, visual, tactile cues.     E-stim 15 min each channel   High volt e-stim to low back for pain control   Channel 1: R lumbar paraspinal and posterior hip 150 V  Channel 2: L lumbar paraspinal and posterior hip 150 V   Pt states back feeling better after E-stim   Try supine with moist heat to low back next visit if appropriate.     Continued worling on R hip/LE strengthening to promote ability to ambulate and perform standing tasks more comfortably and with less difficutly. Continued working on trunk strengthening to help decrease low back discomfort. Added High volt E-stim to low back for pain control as well. Pt will benefit from continued skilled physical therapy services to improve LE strength, decrease back pain, and improve ability to perform standing tasks such as walking.     PT Short Term Goals - 01/03/18 1814      PT SHORT TERM GOAL #1   Title  Patient will be independent with her HEP to improve LE strength and ability to ambulate and perform standing tasks.     Time  3    Period  Weeks    Status  New    Target Date  01/27/18        PT Long Term Goals - 01/03/18 1814      PT LONG TERM GOAL #1   Title  Patient will improve R hip strength by at least 1/2 MMT grade to promote ability to ambulate and perform standing tasks.     Time  8    Period  Weeks    Status  New    Target Date  03/03/18      PT LONG TERM GOAL #2   Title  Patient will be able to ambulate at least 300 ft with [facility] to promote mobility    Baseline  Pt currently ambulates with rw (01/03/2018)    Time  8    Period  Weeks    Status  New    Target Date  03/03/18      PT LONG TERM GOAL #3   Title  Patient will be able to ambulate at least 100 ft independently to promote mobility.     Baseline  Pt currently ambulates with rw. (01/03/2018)    Time  8    Period  Weeks    Status  New    Target Date  03/03/18      PT LONG TERM GOAL #4   Title  Patient will be able to improve 10  Meter Walk Test measurement to at least 0.8 m/s with least restrictive AD to promote community ambulation.     Baseline  0.36 m/s with rw (01/03/2018)    Time  8    Period  Weeks    Status  New    Target Date  03/03/18      PT LONG TERM GOAL #5   Title  Patient will have a decrease in back pain to 5/10 or less at worst to promote ability to perform standing tasks.     Baseline  8/10 back pain at worst for the past month (01/03/2018)    Time  8    Period  Weeks    Status  New    Target Date  03/03/18            Plan - 01/11/18 0911    Clinical Impression Statement  Continued worling on R hip/LE strengthening to promote ability to ambulate and perform standing tasks more comfortably and with less difficutly. Continued working on trunk strengthening to help decrease low back discomfort. Added High volt E-stim to low back for pain control as well. Pt will benefit from continued skilled physical therapy services to improve LE strength, decrease back pain, and improve ability to perform standing tasks such as walking.     Rehab Potential  Fair    Clinical Impairments Affecting Rehab Potential  (-) pain, weakness, age; (+) motivated    PT Frequency  2x / week    PT Duration  8 weeks    PT Treatment/Interventions  Therapeutic activities;Therapeutic exercise;Balance training;Neuromuscular re-education;Patient/family education;Manual techniques;Aquatic Therapy;Electrical Stimulation;Iontophoresis 4mg /ml Dexamethasone;Gait training    PT Next Visit Plan  glute strengthening, femoral control, gait, trunk strengthening, manual techniques, modalities PRN    Consulted and Agree with Plan of Care  Patient       Patient will benefit from skilled therapeutic intervention in order to improve the following deficits and impairments:  Postural dysfunction, Pain, Improper body mechanics, Decreased strength, Difficulty walking, Decreased range of motion  Visit Diagnosis: Pain in right hip  Muscle  weakness (generalized)  Difficulty in walking, not elsewhere classified  Chronic bilateral low back pain, with sciatica presence unspecified     Problem List Patient Active Problem List   Diagnosis Date Noted  . Primary localized osteoarthritis of right hip 11/11/2017  . Symptomatic bradycardia 02/12/2017  . Chronic cough 06/18/2015  . Plantar fascial fibromatosis 06/18/2015  . Preventative health care 01/18/2015  . Paroxysmal atrial fibrillation ([place]) 12/30/2014  . Hypertension, essential, benign 10/16/2014  . Mild persistent chronic asthma without complication 52/77/8242  . Allergic rhinitis 10/16/2014  . GERD (gastroesophageal reflux disease) 10/16/2014  . HLD (hyperlipidemia)   . Arthritis of knee, degenerative 09/07/2014  . DM (diabetes mellitus), type 2, uncontrolled ([place]) 10/26/2012    Joneen Boers PT, DPT   01/11/2018, 11:11 AM  [facility] [address] [place], [address] Phone: [phone]   Fax:  [phone]  Name: FALESHA SCHOMMER MRN: [id number] Date of Birth: Sep 19, 1940

## 2018-01-13 ENCOUNTER — Ambulatory Visit: Payer: Medicare Other

## 2018-01-13 DIAGNOSIS — R262 Difficulty in walking, not elsewhere classified: Secondary | ICD-10-CM

## 2018-01-13 DIAGNOSIS — G8929 Other chronic pain: Secondary | ICD-10-CM

## 2018-01-13 DIAGNOSIS — M25551 Pain in right hip: Secondary | ICD-10-CM

## 2018-01-13 DIAGNOSIS — M545 Low back pain: Secondary | ICD-10-CM

## 2018-01-13 DIAGNOSIS — M6281 Muscle weakness (generalized): Secondary | ICD-10-CM

## 2018-01-13 NOTE — Therapy (Signed)
[facility] [address] [place], [address] Phone: [phone]   Fax:  [phone]  Physical Therapy Treatment  Patient Details  Name: Mallory Arellano MRN: [id number] Date of Birth: 1940-11-12 Referring Provider: Hessie Knows, MD   Encounter Date: 01/13/2018  PT End of Session - 01/13/18 0945    Visit Number  4    Number of Visits  17    Date for PT Re-Evaluation  03/03/18    Authorization Type  4    Authorization Time Period  of 10 progress report    PT Start Time  0946    PT Stop Time  1029    PT Time Calculation (min)  43 min    Equipment Utilized During Treatment  --   pt rw   Activity Tolerance  Patient tolerated treatment well    Behavior During Therapy  [facility] for tasks assessed/performed       Past Medical History:  Diagnosis Date  . A-fib ([place])   . Arthritis   . Asthma   . Diabetes mellitus without complication ([place])   . Dysrhythmia   . GERD (gastroesophageal reflux disease)   . Hypercholesteremia   . Hypertension   . Lower extremity edema   . Palpitation   . UTI (urinary tract infection)     Past Surgical History:  Procedure Laterality Date  . 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 WISDOM TOOTH EXTRACTION      There were no vitals filed for this visit.  Subjective Assessment - 01/13/18 0948    Subjective  R hip is ok. 5-6/10 currently for R hip and low back.     Pertinent History  S/P R THA on 11/11/2017 (anterior approach). Pt fell and developed R hip joint pain which became arthritis. 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  Had home health treatment in which last Friday was her last day.   Currently has a difficult time walking.  Hips feel heavy when trying to stand up.  Pt also states states having low back and R  knee pain. Feels like her R hip, low back and R knee are working together.  Had a shot for her low back 2 weeks ago which helped. Going to have a shot for her R knee soon.   No falls within the past 6 months. Has a fear of falling .     Patient Stated Goals  Walk better.     Currently in Pain?  Yes    Pain Score  6    5-6/10   Pain Onset  More than a month ago                               PT Education - 01/13/18 1324    Education Details  ther-ex    Person(s) Educated  Patient    Methods  Explanation;Demonstration;Tactile cues;Verbal cues    Comprehension  Returned demonstration;Verbalized understanding        Objectives  No latex band allergies   9 weeks post op  Pt provided verbal information for another PT (potential hire) to shadow during treatment and provide information.    Therapeutic Exerecise  Sit to stand from regular chair with bilateral UE assist, cues  for proper foot and hand placement, femoral control throughout session.     Seated bilateral shoulder extension isometrics, hands on thighs to promote trunk strengthening and decrease back pain 10x5 seconds  Seated R hip extension isometrics 10x5 seconds for 3 sets  Sit <>stand from 25 inch high mat table with emphasis on femoral control 10x2 with and without UE assist to promote LE strengthening  Gait using RW with L UE assist only 32 ft x 3, and 30 ft to prepare to transition to a [facility] when appropriate.   Forward step up onto Air Ex pad with bilateral UE assist 10x2 with R LE   standing R hip abduction with bilateral UE assist 1 lbs ankle weight 10x2  Side stepping with bilateral UE assist at treadmill bars 5x each direction to promote glute med muscle strengthening.   Standing with bilateral UE light touch assist, L toe taps onto treadmill platform to promote R LE strength to support herself in standing 10x.   Improved exercise technique, movement at target joints, use  of target muscles after min to mod verbal, visual, tactile cues.    Continued working on improving R LE strength, and femoral control to promote ability to ambulate and perform standing tasks with less difficulty. Demonstrates difficulty with femoral control which improves with cues and practice. Pt will benefit from continued skilled physical therapy services to improve strength, ability to ambulate, and function.           PT Short Term Goals - 01/03/18 1814      PT SHORT TERM GOAL #1   Title  Patient will be independent with her HEP to improve LE strength and ability to ambulate and perform standing tasks.     Time  3    Period  Weeks    Status  New    Target Date  01/27/18        PT Long Term Goals - 01/03/18 1814      PT LONG TERM GOAL #1   Title  Patient will improve R hip strength by at least 1/2 MMT grade to promote ability to ambulate and perform standing tasks.     Time  8    Period  Weeks    Status  New    Target Date  03/03/18      PT LONG TERM GOAL #2   Title  Patient will be able to ambulate at least 300 ft with [facility] to promote mobility    Baseline  Pt currently ambulates with rw (01/03/2018)    Time  8    Period  Weeks    Status  New    Target Date  03/03/18      PT LONG TERM GOAL #3   Title  Patient will be able to ambulate at least 100 ft independently to promote mobility.     Baseline  Pt currently ambulates with rw. (01/03/2018)    Time  8    Period  Weeks    Status  New    Target Date  03/03/18      PT LONG TERM GOAL #4   Title  Patient will be able to improve 10 Meter Walk Test measurement to at least 0.8 m/s with least restrictive AD to promote community ambulation.     Baseline  0.36 m/s with rw (01/03/2018)    Time  8    Period  Weeks    Status  New    Target Date  03/03/18  PT LONG TERM GOAL #5   Title  Patient will have a decrease in back pain to 5/10 or less at worst to promote ability to perform standing tasks.     Baseline   8/10 back pain at worst for the past month (01/03/2018)    Time  8    Period  Weeks    Status  New    Target Date  03/03/18            Plan - 01/13/18 0943    Clinical Impression Statement  Continued working on improving R LE strength, and femoral control to promote ability to ambulate and perform standing tasks with less difficulty. Demonstrates difficulty with femoral control which improves with cues and practice. Pt will benefit from continued skilled physical therapy services to improve strength, ability to ambulate, and function.     Rehab Potential  Fair    Clinical Impairments Affecting Rehab Potential  (-) pain, weakness, age; (+) motivated    PT Frequency  2x / week    PT Duration  8 weeks    PT Treatment/Interventions  Therapeutic activities;Therapeutic exercise;Balance training;Neuromuscular re-education;Patient/family education;Manual techniques;Aquatic Therapy;Electrical Stimulation;Iontophoresis 4mg /ml Dexamethasone;Gait training    PT Next Visit Plan  glute strengthening, femoral control, gait, trunk strengthening, manual techniques, modalities PRN    Consulted and Agree with Plan of Care  Patient       Patient will benefit from skilled therapeutic intervention in order to improve the following deficits and impairments:  Postural dysfunction, Pain, Improper body mechanics, Decreased strength, Difficulty walking, Decreased range of motion  Visit Diagnosis: Pain in right hip  Muscle weakness (generalized)  Difficulty in walking, not elsewhere classified  Chronic bilateral low back pain, with sciatica presence unspecified     Problem List Patient Active Problem List   Diagnosis Date Noted  . Primary localized osteoarthritis of right hip 11/11/2017  . Symptomatic bradycardia 02/12/2017  . Chronic cough 06/18/2015  . Plantar fascial fibromatosis 06/18/2015  . Preventative health care 01/18/2015  . Paroxysmal atrial fibrillation ([place]) 12/30/2014  .  Hypertension, essential, benign 10/16/2014  . Mild persistent chronic asthma without complication 71/16/5790  . Allergic rhinitis 10/16/2014  . GERD (gastroesophageal reflux disease) 10/16/2014  . HLD (hyperlipidemia)   . Arthritis of knee, degenerative 09/07/2014  . DM (diabetes mellitus), type 2, uncontrolled ([place]) 10/26/2012    Joneen Boers PT, DPT   01/13/2018, 1:35 PM  [facility] [address], [place], [address] Phone: [phone]   Fax:  [phone]  Name: Mallory Arellano MRN: [id number] Date of Birth: 1941/02/06

## 2018-01-13 NOTE — Patient Instructions (Signed)
Pt was recommended to practice walking with rw with L UE assist only with someone (for safety) and with R hand hovering on top of rw handle just in case she needs to use it in preparation for walking with [facility] when appropriate. Pt demonstrated and verbalized understanding.

## 2018-01-18 ENCOUNTER — Ambulatory Visit: Payer: Medicare Other

## 2018-01-18 DIAGNOSIS — M25551 Pain in right hip: Secondary | ICD-10-CM | POA: Diagnosis not present

## 2018-01-18 DIAGNOSIS — M545 Low back pain: Secondary | ICD-10-CM

## 2018-01-18 DIAGNOSIS — R262 Difficulty in walking, not elsewhere classified: Secondary | ICD-10-CM

## 2018-01-18 DIAGNOSIS — G8929 Other chronic pain: Secondary | ICD-10-CM

## 2018-01-18 DIAGNOSIS — M6281 Muscle weakness (generalized): Secondary | ICD-10-CM

## 2018-01-18 NOTE — Therapy (Signed)
[facility] [address] [place], [address] Phone: [phone]   Fax:  [phone]  Physical Therapy Treatment  Patient Details  Name: Mallory Arellano MRN: [id number] Date of Birth: 07/28/1940 Referring Provider: Hessie Knows, MD   Encounter Date: 01/18/2018  PT End of Session - 01/18/18 1105    Visit Number  5    Number of Visits  17    Date for PT Re-Evaluation  03/03/18    Authorization Type  5    Authorization Time Period  of 10 progress report    PT Start Time  1105    PT Stop Time  1214    PT Time Calculation (min)  69 min    Equipment Utilized During Treatment  Gait belt   pt rw   Activity Tolerance  Patient tolerated treatment well    Behavior During Therapy  WFL for tasks assessed/performed       Past Medical History:  Diagnosis Date  . A-fib ([place])   . Arthritis   . Asthma   . Diabetes mellitus without complication ([place])   . Dysrhythmia   . GERD (gastroesophageal reflux disease)   . Hypercholesteremia   . Hypertension   . Lower extremity edema   . Palpitation   . UTI (urinary tract infection)     Past Surgical History:  Procedure Laterality Date  . 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 WISDOM TOOTH EXTRACTION      There were no vitals filed for this visit.  Subjective Assessment - 01/18/18 1108    Subjective  Pt states wanting the E-stim and heat for her back today. It feels good and helps her low back pain. 6/10 low back currently.  6/10 R hip pain. Feels R hip joint pain laterally when she walks. Feels depressed at times because she feels homebound.     Pertinent History  S/P R THA on 11/11/2017 (anterior approach). Pt fell and developed R hip joint pain which became arthritis. Got a hip replacement due to her joint being bone on  bone.  Had home health treatment in which last Friday was her last day.   Currently has a difficult time walking.  Hips feel heavy when trying to stand up.  Pt also states states having low back and R knee pain. Feels like her R hip, low back and R knee are working together.  Had a shot for her low back 2 weeks ago which helped. Going to have a shot for her R knee soon.   No falls within the past 6 months. Has a fear of falling .     Patient Stated Goals  Walk better.     Currently in Pain?  Yes    Pain Score  6     Pain Onset  More than a month ago                               PT Education - 01/18/18 1145    Education Details  ther-ex, HEP    Person(s) Educated  Patient    Methods  Explanation;Demonstration;Tactile cues;Verbal cues;Handout    Comprehension  Returned demonstration;Verbalized understanding        Objectives  No latex band allergies   9weeks post op  Doristine Bosworth wants her go to to church 01/29/2018.    Therapeutic Exerecise   Seated R hip extension isometrics 10x5 seconds for 3 sets  Sitting with gentle L lateral shift assist from PT (to correct R lateral shift posture). Decreased low back pain   Sit <>stand from 25 inch high mat table with emphasis on femoral control10x with and without UE assistto promote LE strengthening  Then from 24 inch high mat table 10x with and without UE assist, emphasis on controlling descent onto sitting surface.   Gait using RW with L UE assist only 32 ft x 4, and 30 ft to prepare to transition to a [facility] when appropriate.   Standing on R LE with L tip toe and bilateral UE assist, emphasis on level pelvis 10x5 seconds for 3 sets to promote pelvic control during R LE stance phase of gait.   Reviewed and given as part of her HEP. Pt demonstrated and verbalized understanding.    standing R hip abduction with bilateral UE assist 1 lbs ankle weight 10x3  Forward step up onto 4 inch step with bilateral UE  assist 3x2    Improved exercise technique, movement at target joints, use of target muscles after mod verbal, visual, tactile cues.    E-stim 15 min each channel   High volt e-stim to low back for pain control. Reclined with heating pad to low back 15 minutes              Channel 1: R lumbar paraspinal and posterior hip 145 V             Channel 2: L lumbar paraspinal and posterior hip 150 V  Pt states back feeling much better after E-stim and heat. 4/10 afterwards.   Continued working on R glute med and max strengthening to promote R LE femoral and pelvic control during R LE stance phase of gait and to help decrease pain when walking. Added high volt E-stim to low back for pain control and help decrease back pain and improve function.            PT Short Term Goals - 01/03/18 1814      PT SHORT TERM GOAL #1   Title  Patient will be independent with her HEP to improve LE strength and ability to ambulate and perform standing tasks.     Time  3    Period  Weeks    Status  New    Target Date  01/27/18        PT Long Term Goals - 01/03/18 1814      PT LONG TERM GOAL #1   Title  Patient will improve R hip strength by at least 1/2 MMT grade to promote ability to ambulate and perform standing tasks.     Time  8    Period  Weeks    Status  New    Target Date  03/03/18      PT LONG TERM GOAL #2   Title  Patient will be able to ambulate at least 300 ft with [facility] to promote mobility    Baseline  Pt currently ambulates with rw (01/03/2018)    Time  8    Period  Weeks    Status  New    Target Date  03/03/18      PT LONG TERM GOAL #3   Title  Patient will be able to ambulate at least 100 ft independently to promote mobility.  Baseline  Pt currently ambulates with rw. (01/03/2018)    Time  8    Period  Weeks    Status  New    Target Date  03/03/18      PT LONG TERM GOAL #4   Title  Patient will be able to improve 10 Meter Walk Test measurement to at least 0.8 m/s  with least restrictive AD to promote community ambulation.     Baseline  0.36 m/s with rw (01/03/2018)    Time  8    Period  Weeks    Status  New    Target Date  03/03/18      PT LONG TERM GOAL #5   Title  Patient will have a decrease in back pain to 5/10 or less at worst to promote ability to perform standing tasks.     Baseline  8/10 back pain at worst for the past month (01/03/2018)    Time  8    Period  Weeks    Status  New    Target Date  03/03/18            Plan - 01/18/18 1103    Clinical Impression Statement  Continued working on R glute med and max strengthening to promote R LE femoral and pelvic control during R LE stance phase of gait and to help decrease pain when walking. Added high volt E-stim to low back for pain control and help decrease back pain and improve function.     Rehab Potential  Fair    Clinical Impairments Affecting Rehab Potential  (-) pain, weakness, age; (+) motivated    PT Frequency  2x / week    PT Duration  8 weeks    PT Treatment/Interventions  Therapeutic activities;Therapeutic exercise;Balance training;Neuromuscular re-education;Patient/family education;Manual techniques;Aquatic Therapy;Electrical Stimulation;Iontophoresis 4mg /ml Dexamethasone;Gait training    PT Next Visit Plan  glute strengthening, femoral control, gait, trunk strengthening, manual techniques, modalities PRN    Consulted and Agree with Plan of Care  Patient       Patient will benefit from skilled therapeutic intervention in order to improve the following deficits and impairments:  Postural dysfunction, Pain, Improper body mechanics, Decreased strength, Difficulty walking, Decreased range of motion  Visit Diagnosis: Pain in right hip  Muscle weakness (generalized)  Difficulty in walking, not elsewhere classified  Chronic bilateral low back pain, with sciatica presence unspecified     Problem List Patient Active Problem List   Diagnosis Date Noted  . Primary  localized osteoarthritis of right hip 11/11/2017  . Symptomatic bradycardia 02/12/2017  . Chronic cough 06/18/2015  . Plantar fascial fibromatosis 06/18/2015  . Preventative health care 01/18/2015  . Paroxysmal atrial fibrillation ([place]) 12/30/2014  . Hypertension, essential, benign 10/16/2014  . Mild persistent chronic asthma without complication 02/77/4128  . Allergic rhinitis 10/16/2014  . GERD (gastroesophageal reflux disease) 10/16/2014  . HLD (hyperlipidemia)   . Arthritis of knee, degenerative 09/07/2014  . DM (diabetes mellitus), type 2, uncontrolled ([place]) 10/26/2012    Joneen Boers PT, DPT   01/18/2018, 12:22 PM  [facility] [address], [place], [address] Phone: [phone]   Fax:  [phone]  Name: RASHEIDA BRODEN MRN: [id number] Date of Birth: 11/21/1940

## 2018-01-18 NOTE — Patient Instructions (Signed)
  Standing on R LE with L tip toe and bilateral UE assist, emphasis on level pelvis 10x5 seconds for 3 sets to promote pelvic control during R LE stance phase of gait.   Reviewed and given as part of her HEP. Pt demonstrated and verbalized understanding. Handout provided.

## 2018-01-20 ENCOUNTER — Ambulatory Visit: Payer: Medicare Other

## 2018-01-20 DIAGNOSIS — G8929 Other chronic pain: Secondary | ICD-10-CM

## 2018-01-20 DIAGNOSIS — M6281 Muscle weakness (generalized): Secondary | ICD-10-CM

## 2018-01-20 DIAGNOSIS — M545 Low back pain: Secondary | ICD-10-CM

## 2018-01-20 DIAGNOSIS — R262 Difficulty in walking, not elsewhere classified: Secondary | ICD-10-CM

## 2018-01-20 DIAGNOSIS — M25551 Pain in right hip: Secondary | ICD-10-CM | POA: Diagnosis not present

## 2018-01-20 NOTE — Therapy (Signed)
[facility] [address], [place], [address] Phone: [phone]   Fax:  [phone]  Physical Therapy Treatment  Patient Details  Name: Mallory Arellano MRN: [id number] Date of Birth: 01/25/1941 Referring Provider: Hessie Knows, MD   Encounter Date: 01/20/2018  PT End of Session - 01/20/18 1002    Visit Number  6    Number of Visits  17    Date for PT Re-Evaluation  03/03/18    Authorization Type  6    Authorization Time Period  of 10 progress report    PT Start Time  1007    PT Stop Time  1110    PT Time Calculation (min)  63 min    Equipment Utilized During Treatment  Gait belt   pt rw   Activity Tolerance  Patient tolerated treatment well    Behavior During Therapy  WFL for tasks assessed/performed       Past Medical History:  Diagnosis Date  . A-fib ([place])   . Arthritis   . Asthma   . Diabetes mellitus without complication ([place])   . Dysrhythmia   . GERD (gastroesophageal reflux disease)   . Hypercholesteremia   . Hypertension   . Lower extremity edema   . Palpitation   . UTI (urinary tract infection)     Past Surgical History:  Procedure Laterality Date  . 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 WISDOM TOOTH EXTRACTION      There were no vitals filed for this visit.  Subjective Assessment - 01/20/18 1007    Subjective  The E-stim and the MH for her back last session was amazing. It took the pain away. 5-6/10 back and hip pain currently.     Pertinent History  S/P R THA on 11/11/2017 (anterior approach). Pt fell and developed R hip joint pain which became arthritis. 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  Had home health treatment in which last Friday was her last day.   Currently has a difficult time walking.  Hips feel heavy  when trying to stand up.  Pt also states states having low back and R knee pain. Feels like her R hip, low back and R knee are working together.  Had a shot for her low back 2 weeks ago which helped. Going to have a shot for her R knee soon.   No falls within the past 6 months. Has a fear of falling .     Patient Stated Goals  Walk better.     Currently in Pain?  Yes    Pain Score  6     Pain Onset  More than a month ago                               PT Education - 01/20/18 1005    Education Details  ther-ex    [employer]) Educated  Patient    Methods  Explanation;Demonstration;Tactile cues;Verbal cues    Comprehension  Returned demonstration;Verbalized understanding      Objectives  No latex band allergies  10weeks post op  Doristine Bosworth wants her go to to church 01/29/2018.   Therapeutic Exerecise   Seated R hip extension isometrics 5x10 seconds (upgrade) for 3 sets  Sit <>  stand from 24 inch high mat table with emphasis on femoral control10x2 with and without UE assistto promote LE strengthening   Standing on R LE with L tip toe and bilateral UE assist, emphasis on level pelvis 10x5 seconds for 3 sets to promote pelvic control during R LE stance phase of gait.    Gait using RW with L UE assist only 200 ft (upgrade)  Decreased difficulty walking observed  Standing R hip abduction resisting 1 lbs ankle weight with bilateral UE assist 10x3   Forward step up onto 4 inch step with bilateral UE assist 5x2   Gait with SPC L hand Min A 5 ft x 2. Pt afraid at first but improved after practice.  Gait with rw L UE assist only 30 ft   Improved exercise technique, movement at target joints, use of target muscles after mod verbal, visual, tactile cues.    E-stim15 min each channel  High volte-stimto low backfor pain control. Reclined with heating pad to low back 15 minutes  Channel 1: R lumbar paraspinal and posterior hip 150  V Channel 2: L lumbar paraspinal and posterior hip 150 V             Pt states back feeling much better after E-stim and heat.      Decreasing difficulty with gait with rw observed today.     Continued working on R LE strengthening and femoral control to promote ability to perform standing tasks and help decrease hip and back pain. Initiated ambulation with SPC 2 point gait pattern today with min A. Pt fearful at first but better able to perform with practice. Ended session with High Volt E-stim to low back with MH for pain control. Pt tolerated session without aggravation of symptoms. Pt will benefit from continued skilled physical therapy services to improve R LE strength, function, and decrease low back and R hip pain. Pt demonstrates difficulty with lumbopelvic and femoral control but improves with practice and cues.    PT Short Term Goals - 01/03/18 1814      PT SHORT TERM GOAL #1   Title  Patient will be independent with her HEP to improve LE strength and ability to ambulate and perform standing tasks.     Time  3    Period  Weeks    Status  New    Target Date  01/27/18        PT Long Term Goals - 01/03/18 1814      PT LONG TERM GOAL #1   Title  Patient will improve R hip strength by at least 1/2 MMT grade to promote ability to ambulate and perform standing tasks.     Time  8    Period  Weeks    Status  New    Target Date  03/03/18      PT LONG TERM GOAL #2   Title  Patient will be able to ambulate at least 300 ft with [facility] to promote mobility    Baseline  Pt currently ambulates with rw (01/03/2018)    Time  8    Period  Weeks    Status  New    Target Date  03/03/18      PT LONG TERM GOAL #3   Title  Patient will be able to ambulate at least 100 ft independently to promote mobility.     Baseline  Pt currently ambulates with rw. (01/03/2018)    Time  8    Period  Weeks    Status  New    Target Date  03/03/18      PT LONG TERM GOAL #4   Title   Patient will be able to improve 10 Meter Walk Test measurement to at least 0.8 m/s with least restrictive AD to promote community ambulation.     Baseline  0.36 m/s with rw (01/03/2018)    Time  8    Period  Weeks    Status  New    Target Date  03/03/18      PT LONG TERM GOAL #5   Title  Patient will have a decrease in back pain to 5/10 or less at worst to promote ability to perform standing tasks.     Baseline  8/10 back pain at worst for the past month (01/03/2018)    Time  8    Period  Weeks    Status  New    Target Date  03/03/18            Plan - 01/20/18 0959    Clinical Impression Statement  Continued working on R LE strengthening and femoral control to promote ability to perform standing tasks and help decrease hip and back pain. Initiated ambulation with SPC 2 point gait pattern today with min A. Pt fearful at first but better able to perform with practice. Ended session with High Volt E-stim to low back with MH for pain control. Pt tolerated session without aggravation of symptoms. Pt will benefit from continued skilled physical therapy services to improve R LE strength, function, and decrease low back and R hip pain. Pt demonstrates difficulty with lumbopelvic and femoral control but improves with practice and cues.     Rehab Potential  Fair    Clinical Impairments Affecting Rehab Potential  (-) pain, weakness, age; (+) motivated    PT Frequency  2x / week    PT Duration  8 weeks    PT Treatment/Interventions  Therapeutic activities;Therapeutic exercise;Balance training;Neuromuscular re-education;Patient/family education;Manual techniques;Aquatic Therapy;Electrical Stimulation;Iontophoresis 4mg /ml Dexamethasone;Gait training    PT Next Visit Plan  glute strengthening, femoral control, gait, trunk strengthening, manual techniques, modalities PRN    Consulted and Agree with Plan of Care  Patient       Patient will benefit from skilled therapeutic intervention in order to  improve the following deficits and impairments:  Postural dysfunction, Pain, Improper body mechanics, Decreased strength, Difficulty walking, Decreased range of motion  Visit Diagnosis: Pain in right hip  Muscle weakness (generalized)  Difficulty in walking, not elsewhere classified  Chronic bilateral low back pain, with sciatica presence unspecified     Problem List Patient Active Problem List   Diagnosis Date Noted  . Primary localized osteoarthritis of right hip 11/11/2017  . Symptomatic bradycardia 02/12/2017  . Chronic cough 06/18/2015  . Plantar fascial fibromatosis 06/18/2015  . Preventative health care 01/18/2015  . Paroxysmal atrial fibrillation ([place]) 12/30/2014  . Hypertension, essential, benign 10/16/2014  . Mild persistent chronic asthma without complication 05/27/7251  . Allergic rhinitis 10/16/2014  . GERD (gastroesophageal reflux disease) 10/16/2014  . HLD (hyperlipidemia)   . Arthritis of knee, degenerative 09/07/2014  . DM (diabetes mellitus), type 2, uncontrolled ([place]) 10/26/2012    Joneen Boers PT, DPT   01/20/2018, 1:14 PM  [facility] [address] [place], [address] Phone: [phone]   Fax:  [phone]  Name: KAMIYAH KINDEL MRN: [id number] Date of Birth: 1940/10/07

## 2018-01-25 ENCOUNTER — Ambulatory Visit: Payer: Medicare Other | Attending: Orthopedic Surgery

## 2018-01-25 DIAGNOSIS — M6281 Muscle weakness (generalized): Secondary | ICD-10-CM | POA: Diagnosis present

## 2018-01-25 DIAGNOSIS — M545 Low back pain: Secondary | ICD-10-CM | POA: Diagnosis present

## 2018-01-25 DIAGNOSIS — M25551 Pain in right hip: Secondary | ICD-10-CM | POA: Diagnosis present

## 2018-01-25 DIAGNOSIS — R262 Difficulty in walking, not elsewhere classified: Secondary | ICD-10-CM | POA: Insufficient documentation

## 2018-01-25 DIAGNOSIS — G8929 Other chronic pain: Secondary | ICD-10-CM | POA: Insufficient documentation

## 2018-01-25 NOTE — Therapy (Signed)
[facility] [address] [place], [address] Phone: [phone]   Fax:  [phone]  Physical Therapy Treatment  Patient Details  Name: Mallory Arellano MRN: [id number] Date of Birth: 1941/03/29 Referring Provider: Hessie Knows, MD   Encounter Date: 01/25/2018  PT End of Session - 01/25/18 1124    Visit Number  7    Number of Visits  17    Date for PT Re-Evaluation  03/03/18    Authorization Type  7    Authorization Time Period  of 10 progress report    PT Start Time  1116    PT Stop Time  1221    PT Time Calculation (min)  65 min    Equipment Utilized During Treatment  Gait belt   pt rw   Activity Tolerance  Patient tolerated treatment well    Behavior During Therapy  WFL for tasks assessed/performed       Past Medical History:  Diagnosis Date  . A-fib ([place])   . Arthritis   . Asthma   . Diabetes mellitus without complication ([place])   . Dysrhythmia   . GERD (gastroesophageal reflux disease)   . Hypercholesteremia   . Hypertension   . Lower extremity edema   . Palpitation   . UTI (urinary tract infection)     Past Surgical History:  Procedure Laterality Date  . 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 WISDOM TOOTH EXTRACTION      There were no vitals filed for this visit.  Subjective Assessment - 01/25/18 1119    Subjective  R hip is ok. Feels sore R lateral hip. Soreness in low back. Feels better after treatment. States that the treatment is phenominal. 6/10 back pain when walking.     Pertinent History  S/P R THA on 11/11/2017 (anterior approach). Pt fell and developed R hip joint pain which became arthritis. 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  Had home health treatment in which last Friday was her last day.   Currently has a  difficult time walking.  Hips feel heavy when trying to stand up.  Pt also states states having low back and R knee pain. Feels like her R hip, low back and R knee are working together.  Had a shot for her low back 2 weeks ago which helped. Going to have a shot for her R knee soon.   No falls within the past 6 months. Has a fear of falling .     Patient Stated Goals  Walk better.     Currently in Pain?  Yes    Pain Score  6    back pain when walking.    Pain Onset  More than a month ago                               PT Education - 01/25/18 1124    Education Details  ther-ex    [employer]) Educated  Patient    Methods  Explanation;Demonstration;Tactile cues;Verbal cues    Comprehension  Returned demonstration;Verbalized understanding        Objectives  No latex band allergies  10-11weeks post op  Doristine Bosworth wants her go to to church 01/29/2018.3 steps to go into church, bilateral  rails.   Therapeutic Exerecise   Seated R hip extension isometrics 5x10 seconds for 3 sets  Sit <>stand from 24 inch high mat table with emphasis on femoral control10x 3 (upgraide number of sets) with and without UE assistto promote LE strengthening   Forward step up onto regular step with L LE and over with R LE with UE assist 4x with CGA  Ascending and descending 4 regular steps with bilateral UE assist and CGA 1x with up with the good and down with the bad technique.  Then with L UE assist only 2x   Gait with SPC, CGA 60 ft with 2 point gait pattern   Improved exercise technique, movement at target joints, use of target muscles after mod verbal, visual, tactile cues.    E-stim15 min each channel  High volte-stimto low backfor pain control. Reclined with heating pad to low back 15 minutes Channel 1: R lumbar paraspinal and posterior hip 150V Channel 2: L lumbar paraspinal and posterior hip 150 V     Worked on ascending  and descending regular steps with contralateral UE assist for R LE utilizing the up with the good and down with the surgical side technique to promote ability to negotiate stairs at church. Pt able to perform with CGA. Pt states that she'll make sure someone is there to help her at church if she needs it. Pt also able to ambulate 60 ft today with use of SPC, 2 point gait pattern, SPC on L side with improved confidence compared to previous session. Continued working on utilizing [employer] E-stim at end of session to low back with moist heat secondary to pt reports of feeling better consistently afterwards for her back. Pt tolerated session well without aggravation of symptoms. Pt will benefit from continued skilled physical therapy services to improve strength, function, and decrease back pain.           PT Short Term Goals - 01/03/18 1814      PT SHORT TERM GOAL #1   Title  Patient will be independent with her HEP to improve LE strength and ability to ambulate and perform standing tasks.     Time  3    Period  Weeks    Status  New    Target Date  01/27/18        PT Long Term Goals - 01/03/18 1814      PT LONG TERM GOAL #1   Title  Patient will improve R hip strength by at least 1/2 MMT grade to promote ability to ambulate and perform standing tasks.     Time  8    Period  Weeks    Status  New    Target Date  03/03/18      PT LONG TERM GOAL #2   Title  Patient will be able to ambulate at least 300 ft with [facility] to promote mobility    Baseline  Pt currently ambulates with rw (01/03/2018)    Time  8    Period  Weeks    Status  New    Target Date  03/03/18      PT LONG TERM GOAL #3   Title  Patient will be able to ambulate at least 100 ft independently to promote mobility.     Baseline  Pt currently ambulates with rw. (01/03/2018)    Time  8    Period  Weeks    Status  New    Target Date  03/03/18  PT LONG TERM GOAL #4   Title  Patient will be able to improve 10 Meter  Walk Test measurement to at least 0.8 m/s with least restrictive AD to promote community ambulation.     Baseline  0.36 m/s with rw (01/03/2018)    Time  8    Period  Weeks    Status  New    Target Date  03/03/18      PT LONG TERM GOAL #5   Title  Patient will have a decrease in back pain to 5/10 or less at worst to promote ability to perform standing tasks.     Baseline  8/10 back pain at worst for the past month (01/03/2018)    Time  8    Period  Weeks    Status  New    Target Date  03/03/18            Plan - 01/25/18 1125    Clinical Impression Statement  Worked on ascending and descending regular steps with contralateral UE assist for R LE utilizing the up with the good and down with the surgical side technique to promote ability to negotiate stairs at church. Pt able to perform with CGA. Pt states that she'll make sure someone is there to help her at church if she needs it. Pt also able to ambulate 60 ft today with use of SPC, 2 point gait pattern, SPC on L side with improved confidence compared to previous session. Continued working on utilizing [employer] E-stim at end of session to low back with moist heat secondary to pt reports of feeling better consistently afterwards for her back. Pt tolerated session well without aggravation of symptoms. Pt will benefit from continued skilled physical therapy services to improve strength, function, and decrease back pain.     Rehab Potential  Fair    Clinical Impairments Affecting Rehab Potential  (-) pain, weakness, age; (+) motivated    PT Frequency  2x / week    PT Duration  8 weeks    PT Treatment/Interventions  Therapeutic activities;Therapeutic exercise;Balance training;Neuromuscular re-education;Patient/family education;Manual techniques;Aquatic Therapy;Electrical Stimulation;Iontophoresis 4mg /ml Dexamethasone;Gait training    PT Next Visit Plan  glute strengthening, femoral control, gait, trunk strengthening, manual techniques,  modalities PRN    Consulted and Agree with Plan of Care  Patient       Patient will benefit from skilled therapeutic intervention in order to improve the following deficits and impairments:  Postural dysfunction, Pain, Improper body mechanics, Decreased strength, Difficulty walking, Decreased range of motion  Visit Diagnosis: Pain in right hip  Muscle weakness (generalized)  Difficulty in walking, not elsewhere classified  Chronic bilateral low back pain, with sciatica presence unspecified     Problem List Patient Active Problem List   Diagnosis Date Noted  . Primary localized osteoarthritis of right hip 11/11/2017  . Symptomatic bradycardia 02/12/2017  . Chronic cough 06/18/2015  . Plantar fascial fibromatosis 06/18/2015  . Preventative health care 01/18/2015  . Paroxysmal atrial fibrillation ([place]) 12/30/2014  . Hypertension, essential, benign 10/16/2014  . Mild persistent chronic asthma without complication 12/87/8676  . Allergic rhinitis 10/16/2014  . GERD (gastroesophageal reflux disease) 10/16/2014  . HLD (hyperlipidemia)   . Arthritis of knee, degenerative 09/07/2014  . DM (diabetes mellitus), type 2, uncontrolled ([place]) 10/26/2012    Joneen Boers PT, DPT   01/25/2018, 12:38 PM  [facility] [address] [place], [address] Phone: [phone]   Fax:  [phone]  Name: Mallory Arellano MRN: [id number] Date of Birth: 12-Jun-1940

## 2018-01-26 ENCOUNTER — Ambulatory Visit: Payer: Medicare Other

## 2018-01-26 DIAGNOSIS — G8929 Other chronic pain: Secondary | ICD-10-CM

## 2018-01-26 DIAGNOSIS — M25551 Pain in right hip: Secondary | ICD-10-CM

## 2018-01-26 DIAGNOSIS — M6281 Muscle weakness (generalized): Secondary | ICD-10-CM

## 2018-01-26 DIAGNOSIS — R262 Difficulty in walking, not elsewhere classified: Secondary | ICD-10-CM

## 2018-01-26 DIAGNOSIS — M545 Low back pain: Secondary | ICD-10-CM

## 2018-01-26 NOTE — Therapy (Signed)
[place] [facility] [address], [place], [address] Phone: [phone]   Fax:  [phone]  Physical Therapy Treatment  Patient Details  Name: Mallory Arellano MRN: [id number] Date of Birth: 30-Mar-1941 Referring Provider: Hessie Knows, MD   Encounter Date: 01/26/2018  PT End of Session - 01/26/18 0957    Visit Number  8    Number of Visits  17    Date for PT Re-Evaluation  03/03/18    Authorization Type  8    Authorization Time Period  of 10 progress report    PT Start Time  0956    PT Stop Time  1104    PT Time Calculation (min)  68 min    Equipment Utilized During Treatment  Gait belt   pt rw   Activity Tolerance  Patient tolerated treatment well    Behavior During Therapy  [facility] for tasks assessed/performed       Past Medical History:  Diagnosis Date  . A-fib ([place])   . Arthritis   . Asthma   . Diabetes mellitus without complication ([place])   . Dysrhythmia   . GERD (gastroesophageal reflux disease)   . Hypercholesteremia   . Hypertension   . Lower extremity edema   . Palpitation   . UTI (urinary tract infection)     Past Surgical History:  Procedure Laterality Date  . 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 WISDOM TOOTH EXTRACTION      There were no vitals filed for this visit.  Subjective Assessment - 01/26/18 0958    Subjective  R hip is doing. No pain currently. But feels pain R lateral hip and thigh at times trickling down. Back is getting better with the therapy. 6/10 back pain currently.  The lower back sometimes is not as painful.  Has felt more tired ever since after her hip surgery.      Pertinent History  S/P R THA on 11/11/2017 (anterior approach). Pt fell and developed R hip joint pain which became arthritis. Got a hip replacement due to her  joint being bone on bone.  Had home health treatment in which last Friday was her last day.   Currently has a difficult time walking.  Hips feel heavy when trying to stand up.  Pt also states states having low back and R knee pain. Feels like her R hip, low back and R knee are working together.  Had a shot for her low back 2 weeks ago which helped. Going to have a shot for her R knee soon.   No falls within the past 6 months. Has a fear of falling .     Patient Stated Goals  Walk better.     Currently in Pain?  Yes    Pain Score  6    low back   Pain Onset  More than a month ago                               PT Education - 01/26/18 1002    Education Details  ther-ex    [employer]) Educated  Patient    Methods  Explanation;Demonstration;Tactile cues;Verbal cues    Comprehension  Returned demonstration;Verbalized understanding      Objectives  No latex band allergies  11weeks post op  Doristine Bosworth wants her go to to church 01/29/2018.3 steps to go into church, bilateral rails.   Therapeutic Exerecise   Seated R hip extension isometrics 10x5 seconds, then 5x10 seconds for2 sets  Sit <>stand from 24inch high mat table with emphasis on femoral control10x 3 (upgraide number of sets) with and without UE assistto promote LE strengthening   Gait with SPC, CGA 147 ft with 2 point gait pattern  Side stepping with bilateral UE assist at treadmill bars 5x 2 to promote glute med muscle strengthening.   Pt perspiring after exercise  Therapeutic rest break secondary to fatigue and pt drinking boost after exercises for blood sugar per pt.   Vitals monitored   Blood pressure L arm sitting, mechanically taken: 184/63, HR 58. Pt states taking her blood pressure medication this morning.    182/78, manually taken. Pt denies dizziness or chest pain.   Rest of exercises held off secondary to elevated blood pressure levels   BP L arm sitting 143/77, HR 51  after E-stim and at end of session     Improved exercise technique, movement at target joints, use of target muscles after min to mod verbal, visual, tactile cues.    E-stim15 min each channel  High volte-stimto low backfor pain control. Sitting for 15 minutes  Channel 1: R lumbar paraspinal and posterior hip 150V Channel 2: L lumbar paraspinal and posterior hip 150 V   Continued working on glute med and bilateral LE strengthening to promote ability to perform standing tasks with less difficulty. Continued working on ambulation utilizing the [facility] to promote mobility. Improving ability to ambulate longer distance using the [facility] compared to previous visit. Increased perspiration following side stepping exercise. Rest break provided. Blood pressure L arm sitting 182/78 manually taken after exercises and rest. Rest of exercises held off secondary to elevated blood pressure levels. No complain on chest pain or dizziness. Ended session with high volt e-stim to low back to help with back pain. Pt was recommended to monitor her vital signs and to call her MD if she still has elevated blood pressure levels after a few days. Pt verbalized understanding.      PT Short Term Goals - 01/03/18 1814      PT SHORT TERM GOAL #1   Title  Patient will be independent with her HEP to improve LE strength and ability to ambulate and perform standing tasks.     Time  3    Period  Weeks    Status  New    Target Date  01/27/18        PT Long Term Goals - 01/03/18 1814      PT LONG TERM GOAL #1   Title  Patient will improve R hip strength by at least 1/2 MMT grade to promote ability to ambulate and perform standing tasks.     Time  8    Period  Weeks    Status  New    Target Date  03/03/18      PT LONG TERM GOAL #2   Title  Patient will be able to ambulate at least 300 ft with [facility] to promote mobility    Baseline  Pt currently ambulates with rw (01/03/2018)    Time  8     Period  Weeks    Status  New    Target Date  03/03/18      PT LONG TERM GOAL #3   Title  Patient will be  able to ambulate at least 100 ft independently to promote mobility.     Baseline  Pt currently ambulates with rw. (01/03/2018)    Time  8    Period  Weeks    Status  New    Target Date  03/03/18      PT LONG TERM GOAL #4   Title  Patient will be able to improve 10 Meter Walk Test measurement to at least 0.8 m/s with least restrictive AD to promote community ambulation.     Baseline  0.36 m/s with rw (01/03/2018)    Time  8    Period  Weeks    Status  New    Target Date  03/03/18      PT LONG TERM GOAL #5   Title  Patient will have a decrease in back pain to 5/10 or less at worst to promote ability to perform standing tasks.     Baseline  8/10 back pain at worst for the past month (01/03/2018)    Time  8    Period  Weeks    Status  New    Target Date  03/03/18            Plan - 01/26/18 1003    Clinical Impression Statement  Continued working on glute med and bilateral LE strengthening to promote ability to perform standing tasks with less difficulty. Continued working on ambulation utilizing the [facility] to promote mobility. Improving ability to ambulate longer distance using the [facility] compared to previous visit. Increased perspiration following side stepping exercise. Rest break provided. Blood pressure L arm sitting 182/78 manually taken after exercises and rest. Rest of exercises held off secondary to elevated blood pressure levels. No complain on chest pain or dizziness. Ended session with high volt e-stim to low back to help with back pain. Pt was recommended to monitor her vital signs and to call her MD if she still has elevated blood pressure levels after a few days. Pt verbalized understanding.      Rehab Potential  Fair    Clinical Impairments Affecting Rehab Potential  (-) pain, weakness, age; (+) motivated    PT Frequency  2x / week    PT Duration  8 weeks    PT  Treatment/Interventions  Therapeutic activities;Therapeutic exercise;Balance training;Neuromuscular re-education;Patient/family education;Manual techniques;Aquatic Therapy;Electrical Stimulation;Iontophoresis 4mg /ml Dexamethasone;Gait training    PT Next Visit Plan  glute strengthening, femoral control, gait, trunk strengthening, manual techniques, modalities PRN    Consulted and Agree with Plan of Care  Patient       Patient will benefit from skilled therapeutic intervention in order to improve the following deficits and impairments:  Postural dysfunction, Pain, Improper body mechanics, Decreased strength, Difficulty walking, Decreased range of motion  Visit Diagnosis: Pain in right hip  Muscle weakness (generalized)  Difficulty in walking, not elsewhere classified  Chronic bilateral low back pain, with sciatica presence unspecified     Problem List Patient Active Problem List   Diagnosis Date Noted  . Primary localized osteoarthritis of right hip 11/11/2017  . Symptomatic bradycardia 02/12/2017  . Chronic cough 06/18/2015  . Plantar fascial fibromatosis 06/18/2015  . Preventative health care 01/18/2015  . Paroxysmal atrial fibrillation ([place]) 12/30/2014  . Hypertension, essential, benign 10/16/2014  . Mild persistent chronic asthma without complication 11/22/1599  . Allergic rhinitis 10/16/2014  . GERD (gastroesophageal reflux disease) 10/16/2014  . HLD (hyperlipidemia)   . Arthritis of knee, degenerative 09/07/2014  . DM (diabetes mellitus), type 2, uncontrolled ([place])  10/26/2012    Joneen Boers PT, DPT   01/26/2018, 11:15 AM  [place] [facility] [address], [place], [address] Phone: [phone]   Fax:  [phone]  Name: Mallory Arellano MRN: [id number] Date of Birth: 1940/11/02

## 2018-01-31 ENCOUNTER — Ambulatory Visit: Payer: Medicare Other

## 2018-01-31 DIAGNOSIS — R262 Difficulty in walking, not elsewhere classified: Secondary | ICD-10-CM

## 2018-01-31 DIAGNOSIS — G8929 Other chronic pain: Secondary | ICD-10-CM

## 2018-01-31 DIAGNOSIS — M545 Low back pain: Secondary | ICD-10-CM

## 2018-01-31 DIAGNOSIS — M25551 Pain in right hip: Secondary | ICD-10-CM | POA: Diagnosis not present

## 2018-01-31 DIAGNOSIS — M6281 Muscle weakness (generalized): Secondary | ICD-10-CM

## 2018-01-31 NOTE — Therapy (Signed)
[place] [facility] [address] [place], [address] Phone: [phone]   Fax:  [phone]  Physical Therapy Treatment  Patient Details  Name: Mallory Arellano MRN: [id number] Date of Birth: 06-02-1940 Referring Provider: Hessie Knows, MD   Encounter Date: 01/31/2018  PT End of Session - 01/31/18 0907    Visit Number  9    Number of Visits  17    Date for PT Re-Evaluation  03/03/18    Authorization Type  9    Authorization Time Period  of 10 progress report    PT Start Time  0908    PT Stop Time  0950    PT Time Calculation (min)  42 min    Equipment Utilized During Treatment  Gait belt   pt rw   Activity Tolerance  Patient tolerated treatment well    Behavior During Therapy  [facility] for tasks assessed/performed       Past Medical History:  Diagnosis Date  . A-fib ([place])   . Arthritis   . Asthma   . Diabetes mellitus without complication ([place])   . Dysrhythmia   . GERD (gastroesophageal reflux disease)   . Hypercholesteremia   . Hypertension   . Lower extremity edema   . Palpitation   . UTI (urinary tract infection)     Past Surgical History:  Procedure Laterality Date  . 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 WISDOM TOOTH EXTRACTION      There were no vitals filed for this visit.  Subjective Assessment - 01/31/18 0912    Subjective  R hip is getting better but still feels weak.  No R hip pain in sitting. No back pain in siiting. Church was fabulous. The men helped her up the steps.     Pertinent History  S/P R THA on 11/11/2017 (anterior approach). Pt fell and developed R hip joint pain which became arthritis. 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  Had home health treatment in which last Friday was her last day.   Currently has a difficult  time walking.  Hips feel heavy when trying to stand up.  Pt also states states having low back and R knee pain. Feels like her R hip, low back and R knee are working together.  Had a shot for her low back 2 weeks ago which helped. Going to have a shot for her R knee soon.   No falls within the past 6 months. Has a fear of falling .     Patient Stated Goals  Walk better.     Currently in Pain?  No/denies    Pain Score  0-No pain    Pain Onset  More than a month ago                               PT Education - 01/31/18 0912    Education Details  ther-ex    Person(s) Educated  Patient    Methods  Explanation;Demonstration;Tactile cues;Verbal cues    Comprehension  Returned demonstration;Verbalized understanding      Objectives  No latex band allergies  11-12 weeks post op  Pt states wanting Dr. Jobe Gibbon (cardiologist at [facility]) to be sent her PT note information  regarding her blood pressure levels  (since January 26, 2018)     Therapeutic Exerecise   Seated R hip extension isometrics 10x10 seconds  BP L arm sitting, mechanically taken, normal cuff 160/58. HR 50. Denies lightheadedness or dizziness. Pt states feeling fine.   BP L arm sitting after rest: 168/62, HR 48, mechanically taken  BP L arm sitting following E-stim for back: 162/59, HR 51, mechanically taken   Seated R leg extension 10x5 second to promote quadriceps strengthening  Gait with glute max squeeze using rw  Pt was recommended to contact her MD pertaining to her elevated blood pressure levels. Pt verbalized understanding (pt calling Dr. Clayborn Bigness).   Improved exercise technique, movement at target joints, use of target muscles after mod verbal, visual, tactile cues.      E-stim15 min each channel  High volte-stimto low backfor pain control. Sitting for 15 minutes  Channel 1: R lumbar paraspinal and posterior hip  150V Channel 2: L lumbar paraspinal and posterior hip 150 V   Utilized high volt e-stim initially during treatment secondary to elevated blood pressure to provide rest prior to performing more exercises. Levels remained elevated even after over 15 minutes of rest. Worked on gentle R LE strengthening today secondary to elevated systolic levels. Pt states wanting Dr. Clayborn Bigness (cardiologist at [facility]) to be sent her PT note information regarding her blood pressure levels  (since January 26, 2018). Vital signs on 01/26/2018: 184/63, HR 58; 182/78, then 143/77, HR 51 after E-stim to low back at end of session. Vital signs today: 160/58, HR 50;  168/62, HR 48; 162/59, HR 51.               PT Short Term Goals - 01/03/18 1814      PT SHORT TERM GOAL #1   Title  Patient will be independent with her HEP to improve LE strength and ability to ambulate and perform standing tasks.     Time  3    Period  Weeks    Status  New    Target Date  01/27/18        PT Long Term Goals - 01/03/18 1814      PT LONG TERM GOAL #1   Title  Patient will improve R hip strength by at least 1/2 MMT grade to promote ability to ambulate and perform standing tasks.     Time  8    Period  Weeks    Status  New    Target Date  03/03/18      PT LONG TERM GOAL #2   Title  Patient will be able to ambulate at least 300 ft with [facility] to promote mobility    Baseline  Pt currently ambulates with rw (01/03/2018)    Time  8    Period  Weeks    Status  New    Target Date  03/03/18      PT LONG TERM GOAL #3   Title  Patient will be able to ambulate at least 100 ft independently to promote mobility.     Baseline  Pt currently ambulates with rw. (01/03/2018)    Time  8    Period  Weeks    Status  New    Target Date  03/03/18      PT LONG TERM GOAL #4   Title  Patient will be able to improve 10 Meter Walk Test measurement to at least 0.8 m/s with least restrictive AD to promote community  ambulation.     Baseline  0.36 m/s with rw (01/03/2018)    Time  8    Period  Weeks    Status  New    Target Date  03/03/18      PT LONG TERM GOAL #5   Title  Patient will have a decrease in back pain to 5/10 or less at worst to promote ability to perform standing tasks.     Baseline  8/10 back pain at worst for the past month (01/03/2018)    Time  8    Period  Weeks    Status  New    Target Date  03/03/18            Plan - 01/31/18 0907    Clinical Impression Statement  Utilized high volt e-stim initially during treatment secondary to elevated blood pressure to provide rest prior to performing more exercises. Levels remained elevated even after over 15 minutes of rest. Worked on gentle R LE strengthening today secondary to elevated systolic levels. Pt states wanting Dr. Clayborn Bigness (cardiologist at [facility]) to be sent her PT note information regarding her blood pressure levels  (since January 26, 2018). Vital signs on 01/26/2018: 184/63, HR 58; 182/78, then 143/77, HR 51 after E-stim to low back at end of session. Vital signs today: 160/58, HR 50;  168/62, HR 48; 162/59, HR 51.     Rehab Potential  Fair    Clinical Impairments Affecting Rehab Potential  (-) pain, weakness, age; (+) motivated    PT Frequency  2x / week    PT Duration  8 weeks    PT Treatment/Interventions  Therapeutic activities;Therapeutic exercise;Balance training;Neuromuscular re-education;Patient/family education;Manual techniques;Aquatic Therapy;Electrical Stimulation;Iontophoresis 4mg /ml Dexamethasone;Gait training    PT Next Visit Plan  glute strengthening, femoral control, gait, trunk strengthening, manual techniques, modalities PRN    Consulted and Agree with Plan of Care  Patient       Patient will benefit from skilled therapeutic intervention in order to improve the following deficits and impairments:  Postural dysfunction, Pain, Improper body mechanics, Decreased strength, Difficulty walking, Decreased  range of motion  Visit Diagnosis: Pain in right hip  Muscle weakness (generalized)  Difficulty in walking, not elsewhere classified  Chronic bilateral low back pain, with sciatica presence unspecified     Problem List Patient Active Problem List   Diagnosis Date Noted  . Primary localized osteoarthritis of right hip 11/11/2017  . Symptomatic bradycardia 02/12/2017  . Chronic cough 06/18/2015  . Plantar fascial fibromatosis 06/18/2015  . Preventative health care 01/18/2015  . Paroxysmal atrial fibrillation ([place]) 12/30/2014  . Hypertension, essential, benign 10/16/2014  . Mild persistent chronic asthma without complication 05/27/7251  . Allergic rhinitis 10/16/2014  . GERD (gastroesophageal reflux disease) 10/16/2014  . HLD (hyperlipidemia)   . Arthritis of knee, degenerative 09/07/2014  . DM (diabetes mellitus), type 2, uncontrolled ([place]) 10/26/2012    Joneen Boers PT, DPT   01/31/2018, 7:52 PM  [place] [facility] [address], [place], [address] Phone: [phone]   Fax:  [phone]  Name: Mallory Arellano MRN: [id number] Date of Birth: Nov 21, 1940

## 2018-02-03 ENCOUNTER — Ambulatory Visit: Payer: Medicare Other

## 2018-02-08 ENCOUNTER — Ambulatory Visit: Payer: Medicare Other

## 2018-02-08 ENCOUNTER — Telehealth: Payer: Self-pay

## 2018-02-08 NOTE — Telephone Encounter (Signed)
No show for the past 2 sessions. Called patient who said that her blood pressure was up. Could not stop coughing. Was given hydrocodone and prednisone. Blood pressure is good today. Cancel 02/10/2018 appointment due to a doctor's appointment at around the same time.

## 2018-02-10 ENCOUNTER — Ambulatory Visit: Payer: Medicare Other

## 2018-02-15 ENCOUNTER — Ambulatory Visit: Payer: Medicare Other

## 2018-02-15 DIAGNOSIS — G8929 Other chronic pain: Secondary | ICD-10-CM

## 2018-02-15 DIAGNOSIS — M6281 Muscle weakness (generalized): Secondary | ICD-10-CM

## 2018-02-15 DIAGNOSIS — M25551 Pain in right hip: Secondary | ICD-10-CM

## 2018-02-15 DIAGNOSIS — M545 Low back pain: Secondary | ICD-10-CM

## 2018-02-15 DIAGNOSIS — R262 Difficulty in walking, not elsewhere classified: Secondary | ICD-10-CM

## 2018-02-15 NOTE — Therapy (Signed)
[facility] [address] [place], [address] Phone: [phone]   Fax:  [phone]  Physical Therapy Treatment And Progress Report  Patient Details  Name: Mallory Arellano MRN: [id number] Date of Birth: 1941-01-31 Referring Provider: Hessie Knows, MD   Encounter Date: 02/15/2018  PT End of Session - 02/15/18 0904    Visit Number  10    Number of Visits  17    Date for PT Re-Evaluation  03/03/18    Authorization Type  10    Authorization Time Period  of 10 progress report    PT Start Time  0904    PT Stop Time  0955    PT Time Calculation (min)  51 min    Equipment Utilized During Treatment  Gait belt   pt rw   Activity Tolerance  Patient tolerated treatment well    Behavior During Therapy  [facility] for tasks assessed/performed       Past Medical History:  Diagnosis Date  . A-fib ([place])   . Arthritis   . Asthma   . Diabetes mellitus without complication ([place])   . Dysrhythmia   . GERD (gastroesophageal reflux disease)   . Hypercholesteremia   . Hypertension   . Lower extremity edema   . Palpitation   . UTI (urinary tract infection)     Past Surgical History:  Procedure Laterality Date  . 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 WISDOM TOOTH EXTRACTION      There were no vitals filed for this visit.  Subjective Assessment - 02/15/18 0924    Subjective  5/10 back pain currently. The blood pressure levels bother her. No R hip pain currently (pt sitting). Feels shooting pain sometimes when she walks (R lateral hip).  Went to Dr. Clayborn Bigness a couple of days after last session. Dr. Clayborn Bigness said that there was nothing wrong with her heart. Also saw her pulmonologist (Dr. Vella Kohler) due to cough. They found out that the Lisinopril made her cough badly. Was given 4  mg of prednisone and finished prednisone 2 days ago. Was also given a medicine for her cough which includes codeine which stopped her cough.  Feels like something is triggering her blood pressure.   Takes metroprolol for high blood pressure.  7/10 back pain at most for the past 7 days.     Pertinent History  S/P R THA on 11/11/2017 (anterior approach). Pt fell and developed R hip joint pain which became arthritis. 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  Had home health treatment in which last Friday was her last day.   Currently has a difficult time walking.  Hips feel heavy when trying to stand up.  Pt also states states having low back and R knee pain. Feels like her R hip, low back and R knee are working together.  Had a shot for her low back 2 weeks ago which helped. Going to have a shot for her R knee soon.   No falls within the past 6 months. Has a fear of falling .     Patient Stated Goals  Walk better.     Currently in Pain?  Yes    Pain Score  5     Pain Onset  More than a month ago  [facility] PT Assessment - 02/15/18 0910      Strength   Right Hip Flexion  3+/5    Right Hip Extension  4+/5   seated hip extension isometric                          PT Education - 02/15/18 1105    Education Details  ther-ex, BP    Person(s) Educated  Patient    Methods  Explanation;Demonstration;Verbal cues    Comprehension  Returned demonstration;Verbalized understanding       Objectives  No latex band allergies  13-14weeks post op  Feels like she can stand up better with less discomfort  Therapeutic Exerecise  Vitals monitored  BP L arm sitting, mechanically taken, normal cuff: 156/71, HR 54. No light headedness or dizziness  Directied patient with seated ,manually resisted hip extention, hip flexion 1x  Pt perspired after just 1 repetition of each.   BP L arm sitting, manually taken after aformentioned activity 170/78   E-stim  to low back performed x 15 minutes to provide rest and help decrease back pain.   BP L arm sitting after E-stim 158/70, manually taken  Walking 10 meters with rw   14 seconds (0.71 m/s)  standing R hip abduction 10x2  Pt was recommended to reschedule her follow up appotinemtns (at least 3 ) towards late morning or afternoon to give her medications time to kick in. Pt vitals at Dr. Etta Quill office were within normal limits (taken around 11:16 am).  Past few sessions were around 9 am.   Standing with bilateral UE assist: L toe tap onto treadmill platform to promote R LE weight bearing. 5x3  standng mini squats with B UE assist 5x, then 4x with emphasis on femoral control     Improved exercise technique, movement at target joints, use of target muscles after min to mod verbal, visual, tactile cues.      E-stim15 min each channel  High volte-stimto low backfor pain control. Sitting for 15 minutes  Channel 1: R lumbar paraspinal and posterior hip 150V Channel 2: L lumbar paraspinal and posterior hip 150 V   Light session performed today secondary to elevated blood pressure levels. Pt was recommended to reschedule her next 3 follow up appointments for late morning or early afternoon to give her medications time to kick in since her vitals at her cardiologist appointment were within normal limits, taken around 11:16 am. Current few sessions began earlier than 11 am.   Pt took her medications about an hour prior to today's appointment. Patient demonstrates overall improved R hip strength, improved gait speed when walking 10 meters using her rw, and slight decrease in back pain since initial evaluation. Challenges with progress include elevated blood pressure levels, limiting certain exercises pt able to perform. Pt still demonstrates R LE weakness, back and hip pain/discomfort, and difficulty performing functional tasks such as walking and would benefit from  continued skilled physical therapy services to address the aforementioned deficits.      PT Short Term Goals - 01/03/18 1814      PT SHORT TERM GOAL #1   Title  Patient will be independent with her HEP to improve LE strength and ability to ambulate and perform standing tasks.     Time  3    Period  Weeks    Status  New    Target Date  01/27/18        PT Long Term  Goals - 02/15/18 0951      PT LONG TERM GOAL #1   Title  Patient will improve R hip strength by at least 1/2 MMT grade to promote ability to ambulate and perform standing tasks.     Time  8    Period  Weeks    Status  Partially Met    Target Date  03/03/18      PT LONG TERM GOAL #2   Title  Patient will be able to ambulate at least 300 ft with [facility] to promote mobility    Baseline  Pt currently ambulates with rw (01/03/2018); (02/15/2018)    Time  8    Period  Weeks    Status  On-going    Target Date  03/03/18      PT LONG TERM GOAL #3   Title  Patient will be able to ambulate at least 100 ft independently to promote mobility.     Baseline  Pt currently ambulates with rw. (01/03/2018); (02/15/2018)    Time  8    Period  Weeks    Status  On-going    Target Date  03/03/18      PT LONG TERM GOAL #4   Title  Patient will be able to improve 10 Meter Walk Test measurement to at least 0.8 m/s with least restrictive AD to promote community ambulation.     Baseline  0.36 m/s with rw (01/03/2018); 0.71 m/s (02/15/2018)    Time  8    Period  Weeks    Status  Partially Met    Target Date  03/03/18      PT LONG TERM GOAL #5   Title  Patient will have a decrease in back pain to 5/10 or less at worst to promote ability to perform standing tasks.     Baseline  8/10 back pain at worst for the past month (01/03/2018); 7/10 back pain at most for the past 7 days.     Time  8    Period  Weeks    Status  On-going    Target Date  03/03/18            Plan - 02/15/18 0901    Clinical Impression Statement  Light session  performed today secondary to elevated blood pressure levels. Pt was recommended to reschedule her next 3 follow up appointments for late morning or early afternoon to give her medications time to kick in since her vitals at her cardiologist appointment were within normal limits, taken around 11:16 am. Current few sessions began earlier than 11 am.   Pt took her medications about an hour prior to today's appointment. Patient demonstrates overall improved R hip strength, improved gait speed when walking 10 meters using her rw, and slight decrease in back pain since initial evaluation. Challenges with progress include elevated blood pressure levels, limiting certain exercises pt able to perform. Pt still demonstrates R LE weakness, back and hip pain/discomfort, and difficulty performing functional tasks such as walking and would benefit from continued skilled physical therapy services to address the aforementioned deficits.     History and Personal Factors relevant to plan of care:   S/P R THA on 11/11/2017. Multiple areas of pain (R hip, R knee, low back), weakness, difficulty walking, performing standing tasks, decreased endurance. Blood pressure. Support from son and friend (rides)     Clinical Presentation  Stable    Clinical Presentation due to:  Improved R hip strength and 10 meter walk  test since initial evaluation    Clinical Decision Making  Low    Rehab Potential  Fair    Clinical Impairments Affecting Rehab Potential  (-) pain, weakness, age; (+) motivated    PT Frequency  2x / week    PT Duration  8 weeks    PT Treatment/Interventions  Therapeutic activities;Therapeutic exercise;Balance training;Neuromuscular re-education;Patient/family education;Manual techniques;Aquatic Therapy;Electrical Stimulation;Iontophoresis 14m/ml Dexamethasone;Gait training    PT Next Visit Plan  glute strengthening, femoral control, gait, trunk strengthening, manual techniques, modalities PRN    Consulted and Agree with  Plan of Care  Patient       Patient will benefit from skilled therapeutic intervention in order to improve the following deficits and impairments:  Postural dysfunction, Pain, Improper body mechanics, Decreased strength, Difficulty walking, Decreased range of motion  Visit Diagnosis: Pain in right hip  Muscle weakness (generalized)  Difficulty in walking, not elsewhere classified  Chronic bilateral low back pain, with sciatica presence unspecified     Problem List Patient Active Problem List   Diagnosis Date Noted  . Primary localized osteoarthritis of right hip 11/11/2017  . Symptomatic bradycardia 02/12/2017  . Chronic cough 06/18/2015  . Plantar fascial fibromatosis 06/18/2015  . Preventative health care 01/18/2015  . Paroxysmal atrial fibrillation ([place] 12/30/2014  . Hypertension, essential, benign 10/16/2014  . Mild persistent chronic asthma without complication 021/03/7355 . Allergic rhinitis 10/16/2014  . GERD (gastroesophageal reflux disease) 10/16/2014  . HLD (hyperlipidemia)   . Arthritis of knee, degenerative 09/07/2014  . DM (diabetes mellitus), type 2, uncontrolled ([place] 10/26/2012    Thank you for your referral.  MJoneen BoersPT, DPT   02/15/2018, 11:15 AM  [facility] [address] [place] [id number]: [phone]  Fax:  [phone] Name: NCHARIAH BAILEYMRN: [id number] of Birth: [birth year]

## 2018-02-17 ENCOUNTER — Ambulatory Visit: Payer: Medicare Other

## 2018-02-17 ENCOUNTER — Telehealth: Payer: Self-pay

## 2018-02-17 NOTE — Telephone Encounter (Signed)
No show. Called patient and left a message pertaining to appointment and a reminder for the next follow up session. Return phone call requested. Phone number ([phone]) provided.   

## 2018-02-22 ENCOUNTER — Ambulatory Visit: Payer: Medicare Other | Attending: Orthopedic Surgery

## 2018-02-22 DIAGNOSIS — M6281 Muscle weakness (generalized): Secondary | ICD-10-CM | POA: Insufficient documentation

## 2018-02-22 DIAGNOSIS — R262 Difficulty in walking, not elsewhere classified: Secondary | ICD-10-CM | POA: Insufficient documentation

## 2018-02-22 DIAGNOSIS — G8929 Other chronic pain: Secondary | ICD-10-CM | POA: Insufficient documentation

## 2018-02-22 DIAGNOSIS — M25551 Pain in right hip: Secondary | ICD-10-CM | POA: Insufficient documentation

## 2018-02-22 DIAGNOSIS — M545 Low back pain: Secondary | ICD-10-CM | POA: Insufficient documentation

## 2018-02-24 ENCOUNTER — Ambulatory Visit: Payer: Medicare Other | Admitting: Physical Therapy

## 2018-03-01 ENCOUNTER — Telehealth: Payer: Self-pay

## 2018-03-01 ENCOUNTER — Ambulatory Visit: Payer: Medicare Other

## 2018-03-01 NOTE — Telephone Encounter (Signed)
No show. Called patient who said that she can make it to the 2:15 pm appointment on Thursday 03/03/18.

## 2018-03-03 ENCOUNTER — Ambulatory Visit: Payer: Medicare Other

## 2018-03-03 ENCOUNTER — Telehealth: Payer: Self-pay

## 2018-03-03 NOTE — Telephone Encounter (Signed)
No show. Called patient pertaining to the appointment. Pt states that she thought that her appointment is at 3:15 pm. Will be able to make it to her next appointment at 03/08/2018 at 2:30 pm. Pt was informed of the no show/cancellation policy due to multiple missed appointments (afternoon sessions).

## 2018-03-08 ENCOUNTER — Ambulatory Visit: Payer: Medicare Other

## 2018-03-08 DIAGNOSIS — R262 Difficulty in walking, not elsewhere classified: Secondary | ICD-10-CM | POA: Diagnosis present

## 2018-03-08 DIAGNOSIS — M6281 Muscle weakness (generalized): Secondary | ICD-10-CM

## 2018-03-08 DIAGNOSIS — M25551 Pain in right hip: Secondary | ICD-10-CM | POA: Diagnosis present

## 2018-03-08 DIAGNOSIS — M545 Low back pain: Secondary | ICD-10-CM | POA: Diagnosis present

## 2018-03-08 DIAGNOSIS — G8929 Other chronic pain: Secondary | ICD-10-CM

## 2018-03-08 NOTE — Therapy (Signed)
[facility] [address] [place], [address] Phone: [phone]   Fax:  [phone]  Physical Therapy Treatment  Patient Details  Name: Mallory Arellano MRN: [id number] Date of Birth: Dec 31, 1940 Referring Provider (PT): Hessie Knows, MD   Encounter Date: 03/08/2018  PT End of Session - 03/08/18 1434    Visit Number  11    Number of Visits  29    Date for PT Re-Evaluation  04/21/18    Authorization Type  1    Authorization Time Period  of 10 progress report    PT Start Time  1434    PT Stop Time  1531    PT Time Calculation (min)  57 min    Equipment Utilized During Treatment  Gait belt   pt rw   Activity Tolerance  Patient tolerated treatment well    Behavior During Therapy  WFL for tasks assessed/performed       Past Medical History:  Diagnosis Date  . A-fib ([place])   . Arthritis   . Asthma   . Diabetes mellitus without complication ([place])   . Dysrhythmia   . GERD (gastroesophageal reflux disease)   . Hypercholesteremia   . Hypertension   . Lower extremity edema   . Palpitation   . UTI (urinary tract infection)     Past Surgical History:  Procedure Laterality Date  . 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 WISDOM TOOTH EXTRACTION      There were no vitals filed for this visit.  Subjective Assessment - 03/08/18 1444    Subjective  Feels shooting pain R lateral hip (6/10 currently). Also feels stiffness in her low back (8/10 currently and at worst for the past 7 days).     Pertinent History  S/P R THA on 11/11/2017 (anterior approach). Pt fell and developed R hip joint pain which became arthritis. 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  Had home health treatment in which last Friday was her last day.   Currently has a difficult  time walking.  Hips feel heavy when trying to stand up.  Pt also states states having low back and R knee pain. Feels like her R hip, low back and R knee are working together.  Had a shot for her low back 2 weeks ago which helped. Going to have a shot for her R knee soon.   No falls within the past 6 months. Has a fear of falling .     Patient Stated Goals  Walk better.     Currently in Pain?  Yes    Pain Score  8     Pain Location  Back    Pain Onset  More than a month ago         [facility] PT Assessment - 03/08/18 1446      Observation/Other Assessments   Observations  10 Meter walk test 0.72 m/s average with rw      Strength   Right Hip Flexion  4-/5    Right Hip Extension  4/5                           PT Education - 03/08/18 1727    Education Details  ther-ex    [employer]) Educated  Patient    Methods  Explanation;Demonstration;Tactile cues;Verbal cues    Comprehension  Returned demonstration;Verbalized understanding        Objectives  No latex band allergies   Therapeutic Exerecise  BP L arm sitting, manually taken, normal cuff 180/78. Pt states no light headedness or dizziness, no headaches.  Then mechnically taken 160/63, HR 56  (BP at cardiologist's office earlier this morning 9:57 am 128/60, HR 58).    Seated manually resisted R hip flexion, R hip extension 3x each way  Reviewed progress/current status with hip strength with pt.   Walking 10 meters with rw 2x  15 seconds, 13 seconds (0.67 m/s, 0/77 m/s; 0.72 m/s avg)  Gait with SPC, CGA 56 ft. Mod cues for proper technique.   Sit <> stand throughout session with B UE assist on regular chair, emphasis on femoral control and placing center of gravity over base of support, and hand placement. Pt demonstrates tendency to lean back.    Improved exercise technique, movement at target joints, use of target muscles after mod verbal, visual, tactile cues.      E-stim15 min each  channel  High volte-stimto low backfor pain control.Sitting for15 minutes  Channel 1: R lumbar paraspinal and posterior hip 150V Channel 2: L lumbar paraspinal and posterior hip 150 V    No heat today.    Pt states that the heat makes the e-stim more effective afterwards.     Pt demonstrates overall improved hip flexion, slight improved hip extension strength, and improved gait speed using rw since initial evaluation. Currently able to ambulate at least 56 ft with SPC and CGA. Currently working on improving LE strength, ability to ambulate with SPC and ability to perform functional tasks. Difficulty with progress secondary to elevated blood pressure levels at the clinic which limits exercise as well as back pain. Vitals at her cardiologist's office however within normal range. Pt still demonstrates R LE weakness, decreased femoral control with standing activities, R hip and low back pain, difficulty walking and difficulty performing functional tasks and would benefit from continued skilled physical therapy services to address the aforementioned deficits.       PT Short Term Goals - 03/08/18 1730      PT SHORT TERM GOAL #1   Title  Patient will be independent with her HEP to improve LE strength and ability to ambulate and perform standing tasks.     Time  3    Period  Weeks    Status  On-going    Target Date  03/31/18        PT Long Term Goals - 03/08/18 1730      PT LONG TERM GOAL #1   Title  Patient will improve R hip strength by at least 1/2 MMT grade to promote ability to ambulate and perform standing tasks.     Time  6    Period  Weeks    Status  Partially Met    Target Date  04/21/18      PT LONG TERM GOAL #2   Title  Patient will be able to ambulate at least 300 ft with [facility] to promote mobility    Baseline  Pt currently ambulates with rw (01/03/2018); (02/15/2018); 56 ft with SPC (03/08/2018)    Time  6    Period  Weeks    Status   On-going    Target Date  04/21/18      PT LONG TERM GOAL #3   Title  Patient  will be able to ambulate at least 100 ft independently to promote mobility.     Baseline  Pt currently ambulates with rw. (01/03/2018); (02/15/2018); 56 ft with SPC (03/08/2018)    Time  6    Period  Weeks    Status  On-going    Target Date  04/21/18      PT LONG TERM GOAL #4   Title  Patient will be able to improve 10 Meter Walk Test measurement to at least 0.8 m/s with least restrictive AD to promote community ambulation.     Baseline  0.36 m/s with rw (01/03/2018); 0.71 m/s (02/15/2018); 0.72 m/s average with rw (03/08/2018).     Time  8    Period  Weeks    Status  Partially Met      PT LONG TERM GOAL #5   Title  Patient will have a decrease in back pain to 5/10 or less at worst to promote ability to perform standing tasks.     Baseline  8/10 back pain at worst for the past month (01/03/2018); 7/10 back pain at most for the past 7 days. 8/10 at most for the past 7 days (03/08/2018)    Time  6    Period  Weeks    Status  On-going    Target Date  04/21/18            Plan - 03/08/18 1434    Clinical Impression Statement  Pt demonstrates overall improved hip flexion, slight improved hip extension strength, and improved gait speed using rw since initial evaluation. Currently able to ambulate at least 56 ft with SPC and CGA. Currently working on improving LE strength, ability to ambulate with SPC and ability to perform functional tasks. Difficulty with progress secondary to elevated blood pressure levels at the clinic which limits exercise as well as back pain. Vitals at her cardiologist's office however within normal range. Pt still demonstrates R LE weakness, decreased femoral control with standing activities, R hip and low back pain, difficulty walking and difficulty performing functional tasks and would benefit from continued skilled physical therapy services to address the aforementioned deficits.     Rehab  Potential  Fair    Clinical Impairments Affecting Rehab Potential  (-) pain, weakness, age; (+) motivated    PT Frequency  2x / week    PT Duration  6 weeks    PT Treatment/Interventions  Therapeutic activities;Therapeutic exercise;Balance training;Neuromuscular re-education;Patient/family education;Manual techniques;Aquatic Therapy;Electrical Stimulation;Iontophoresis 35m/ml Dexamethasone;Gait training    PT Next Visit Plan  glute strengthening, femoral control, gait, trunk strengthening, manual techniques, modalities PRN    Consulted and Agree with Plan of Care  Patient       Patient will benefit from skilled therapeutic intervention in order to improve the following deficits and impairments:  Postural dysfunction, Pain, Improper body mechanics, Decreased strength, Difficulty walking, Decreased range of motion  Visit Diagnosis: Pain in right hip - Plan: PT plan of care cert/re-cert  Muscle weakness (generalized) - Plan: PT plan of care cert/re-cert  Difficulty in walking, not elsewhere classified - Plan: PT plan of care cert/re-cert  Chronic bilateral low back pain, unspecified whether sciatica present - Plan: PT plan of care cert/re-cert     Problem List Patient Active Problem List   Diagnosis Date Noted  . Primary localized osteoarthritis of right hip 11/11/2017  . Symptomatic bradycardia 02/12/2017  . Chronic cough 06/18/2015  . Plantar fascial fibromatosis 06/18/2015  . Preventative health care 01/18/2015  . Paroxysmal  atrial fibrillation ([place]) 12/30/2014  . Hypertension, essential, benign 10/16/2014  . Mild persistent chronic asthma without complication 83/38/2505  . Allergic rhinitis 10/16/2014  . GERD (gastroesophageal reflux disease) 10/16/2014  . HLD (hyperlipidemia)   . Arthritis of knee, degenerative 09/07/2014  . DM (diabetes mellitus), type 2, uncontrolled ([place]) 10/26/2012    Joneen Boers PT, DPT   03/08/2018, 6:40 PM  [facility] [address], [place], [address] Phone: [phone]   Fax:  [phone]  Name: Mallory Arellano MRN: [id number] Date of Birth: March 30, 1941

## 2018-03-10 ENCOUNTER — Ambulatory Visit: Payer: Medicare Other

## 2018-03-15 ENCOUNTER — Ambulatory Visit: Payer: Medicare Other

## 2018-03-15 DIAGNOSIS — R262 Difficulty in walking, not elsewhere classified: Secondary | ICD-10-CM

## 2018-03-15 DIAGNOSIS — M25551 Pain in right hip: Secondary | ICD-10-CM | POA: Diagnosis not present

## 2018-03-15 DIAGNOSIS — M545 Low back pain: Secondary | ICD-10-CM

## 2018-03-15 DIAGNOSIS — M6281 Muscle weakness (generalized): Secondary | ICD-10-CM

## 2018-03-15 DIAGNOSIS — G8929 Other chronic pain: Secondary | ICD-10-CM

## 2018-03-15 NOTE — Therapy (Signed)
[facility] [address], [place], [address] Phone: [phone]   Fax:  [phone]  Physical Therapy Treatment  Patient Details  Name: Mallory Arellano MRN: [id number] Date of Birth: March 17, 1941 Referring Provider (PT): Hessie Knows, MD   Encounter Date: 03/15/2018  PT End of Session - 03/15/18 1539    Visit Number  12    Number of Visits  29    Date for PT Re-Evaluation  04/21/18    Authorization Type  2    Authorization Time Period  of 10 progress report    PT Start Time  1540   pt arrived late   PT Stop Time  1631    PT Time Calculation (min)  51 min    Equipment Utilized During Treatment  Gait belt   pt rw   Activity Tolerance  Patient tolerated treatment well    Behavior During Therapy  WFL for tasks assessed/performed       Past Medical History:  Diagnosis Date  . A-fib ([place])   . Arthritis   . Asthma   . Diabetes mellitus without complication ([place])   . Dysrhythmia   . GERD (gastroesophageal reflux disease)   . Hypercholesteremia   . Hypertension   . Lower extremity edema   . Palpitation   . UTI (urinary tract infection)     Past Surgical History:  Procedure Laterality Date  . 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 WISDOM TOOTH EXTRACTION      There were no vitals filed for this visit.  Subjective Assessment - 03/15/18 1542    Subjective  Hip is ok (5/10 currently). Back is hurting, 6/10 currently. Calling her back doctor.     Pertinent History  S/P R THA on 11/11/2017 (anterior approach). Pt fell and developed R hip joint pain which became arthritis. 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  Had home health treatment in which last Friday was her last day.   Currently has a difficult time walking.  Hips feel heavy when  trying to stand up.  Pt also states states having low back and R knee pain. Feels like her R hip, low back and R knee are working together.  Had a shot for her low back 2 weeks ago which helped. Going to have a shot for her R knee soon.   No falls within the past 6 months. Has a fear of falling .     Patient Stated Goals  Walk better.     Currently in Pain?  Yes    Pain Score  6     Pain Onset  More than a month ago                              Objectives  No latex band allergies  Medbridge Access Code: [email or web address]     Therapeutic Exerecise  BP L arm sitting, mechanically taken, normal cuff 153/64, HR 48   Gait with SPC, 150 ft, CGA for first 100 ft, SBA last 50 ft.   Sit <> stand throughout session with cues for proper technique   Standing R hip abduction with B UE assist 10x3 each LE   Standing mini squat with B UE assist 10x2  Forward step  up onto Air Ex pad with R LE and B UE assist 10x  Gait with SPC 115 ft SBA   Improved exercise technique, movement at target joints, use of target muscles after mod verbal, visual, tactile cues.    E-stim15 min each channel  High volte-stimto low backfor pain control. Sitting with heating pad to low back 15 minutes Channel 1: R lumbar paraspinal and posterior hip 160V Channel 2: L lumbar paraspinal and posterior hip 160 V   Pt demonstrates improved ability to ambulate with SPC, being able to walk at least 115 ft with SBA. Also improving ability to perform sit <> stand transfers with improved femoral control and ability to place and maintain her center of gravity over her base of support. Continued working on hip and LE strengthening as well as step ups onto uneven surface to promote function outside of therapy. Continued with high volt e-stim at end of session to help decrease back pain. Pt tolerated session well without aggravation of symptoms. Pt will benefit from  continued skilled physical therapy services to improve LE strength, femoral control and function.      PT Education - 03/15/18 1606    Education Details  ther-ex, HEP    Person(s) Educated  Patient    Methods  Explanation;Demonstration;Tactile cues;Verbal cues;Handout    Comprehension  Returned demonstration;Verbalized understanding       PT Short Term Goals - 03/08/18 1730      PT SHORT TERM GOAL #1   Title  Patient will be independent with her HEP to improve LE strength and ability to ambulate and perform standing tasks.     Time  3    Period  Weeks    Status  On-going    Target Date  03/31/18        PT Long Term Goals - 03/08/18 1730      PT LONG TERM GOAL #1   Title  Patient will improve R hip strength by at least 1/2 MMT grade to promote ability to ambulate and perform standing tasks.     Time  6    Period  Weeks    Status  Partially Met    Target Date  04/21/18      PT LONG TERM GOAL #2   Title  Patient will be able to ambulate at least 300 ft with [facility] to promote mobility    Baseline  Pt currently ambulates with rw (01/03/2018); (02/15/2018); 56 ft with SPC (03/08/2018)    Time  6    Period  Weeks    Status  On-going    Target Date  04/21/18      PT LONG TERM GOAL #3   Title  Patient will be able to ambulate at least 100 ft independently to promote mobility.     Baseline  Pt currently ambulates with rw. (01/03/2018); (02/15/2018); 56 ft with SPC (03/08/2018)    Time  6    Period  Weeks    Status  On-going    Target Date  04/21/18      PT LONG TERM GOAL #4   Title  Patient will be able to improve 10 Meter Walk Test measurement to at least 0.8 m/s with least restrictive AD to promote community ambulation.     Baseline  0.36 m/s with rw (01/03/2018); 0.71 m/s (02/15/2018); 0.72 m/s average with rw (03/08/2018).     Time  8    Period  Weeks    Status  Partially Met  PT LONG TERM GOAL #5   Title  Patient will have a decrease in back pain to 5/10 or less at  worst to promote ability to perform standing tasks.     Baseline  8/10 back pain at worst for the past month (01/03/2018); 7/10 back pain at most for the past 7 days. 8/10 at most for the past 7 days (03/08/2018)    Time  6    Period  Weeks    Status  On-going    Target Date  04/21/18            Plan - 03/15/18 1542    Clinical Impression Statement  Pt demonstrates improved ability to ambulate with SPC, being able to walk at least 115 ft with SBA. Also improving ability to perform sit <> stand transfers with improved femoral control and ability to place and maintain her center of gravity over her base of support. Continued working on hip and LE strengthening as well as step ups onto uneven surface to promote function outside of therapy. Continued with high volt e-stim at end of session to help decrease back pain. Pt tolerated session well without aggravation of symptoms. Pt will benefit from continued skilled physical therapy services to improve LE strength, femoral control and function.      Rehab Potential  Fair    Clinical Impairments Affecting Rehab Potential  (-) pain, weakness, age; (+) motivated    PT Frequency  2x / week    PT Duration  6 weeks    PT Treatment/Interventions  Therapeutic activities;Therapeutic exercise;Balance training;Neuromuscular re-education;Patient/family education;Manual techniques;Aquatic Therapy;Electrical Stimulation;Iontophoresis 62m/ml Dexamethasone;Gait training    PT Next Visit Plan  glute strengthening, femoral control, gait, trunk strengthening, manual techniques, modalities PRN    Consulted and Agree with Plan of Care  Patient       Patient will benefit from skilled therapeutic intervention in order to improve the following deficits and impairments:  Postural dysfunction, Pain, Improper body mechanics, Decreased strength, Difficulty walking, Decreased range of motion  Visit Diagnosis: Pain in right hip  Muscle weakness (generalized)  Difficulty  in walking, not elsewhere classified  Chronic bilateral low back pain, unspecified whether sciatica present     Problem List Patient Active Problem List   Diagnosis Date Noted  . Primary localized osteoarthritis of right hip 11/11/2017  . Symptomatic bradycardia 02/12/2017  . Chronic cough 06/18/2015  . Plantar fascial fibromatosis 06/18/2015  . Preventative health care 01/18/2015  . Paroxysmal atrial fibrillation ([place] 12/30/2014  . Hypertension, essential, benign 10/16/2014  . Mild persistent chronic asthma without complication 094/70/9628 . Allergic rhinitis 10/16/2014  . GERD (gastroesophageal reflux disease) 10/16/2014  . HLD (hyperlipidemia)   . Arthritis of knee, degenerative 09/07/2014  . DM (diabetes mellitus), type 2, uncontrolled ([place] 10/26/2012    MJoneen BoersPT, DPT   03/15/2018, 4:40 PM  [facility] [address] [place] [address]Phone: [phone]  Fax:  [phone] Name: NCOOKIE POREMRN: [id number] of Birth: 506-Oct-1942

## 2018-03-15 NOTE — Patient Instructions (Addendum)
Medbridge Access Code: [email or web address]   Standing Hip Abduction with Counter Support  10x3 each LE

## 2018-03-17 ENCOUNTER — Ambulatory Visit: Payer: Medicare Other

## 2018-03-22 ENCOUNTER — Ambulatory Visit: Payer: Medicare Other

## 2018-03-22 DIAGNOSIS — M6281 Muscle weakness (generalized): Secondary | ICD-10-CM

## 2018-03-22 DIAGNOSIS — M545 Low back pain, unspecified: Secondary | ICD-10-CM

## 2018-03-22 DIAGNOSIS — R262 Difficulty in walking, not elsewhere classified: Secondary | ICD-10-CM

## 2018-03-22 DIAGNOSIS — G8929 Other chronic pain: Secondary | ICD-10-CM

## 2018-03-22 DIAGNOSIS — M25551 Pain in right hip: Secondary | ICD-10-CM

## 2018-03-22 NOTE — Therapy (Signed)
[facility] [address] [place], [address] Phone: [phone]   Fax:  [phone]  Physical Therapy Treatment  Patient Details  Name: Mallory Arellano MRN: [id number] Date of Birth: Mar 23, 1941 Referring Provider (PT): Hessie Knows, MD   Encounter Date: 03/22/2018  PT End of Session - 03/22/18 1118    Visit Number  13    Number of Visits  29    Date for PT Re-Evaluation  04/21/18    Authorization Type  3    Authorization Time Period  of 10 progress report    PT Start Time  1118    PT Stop Time  1221    PT Time Calculation (min)  63 min    Equipment Utilized During Treatment  Gait belt   pt rw   Activity Tolerance  Patient tolerated treatment well    Behavior During Therapy  WFL for tasks assessed/performed       Past Medical History:  Diagnosis Date  . A-fib ([place])   . Arthritis   . Asthma   . Diabetes mellitus without complication ([place])   . Dysrhythmia   . GERD (gastroesophageal reflux disease)   . Hypercholesteremia   . Hypertension   . Lower extremity edema   . Palpitation   . UTI (urinary tract infection)     Past Surgical History:  Procedure Laterality Date  . 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 WISDOM TOOTH EXTRACTION      There were no vitals filed for this visit.  Subjective Assessment - 03/22/18 1120    Subjective  Pt states back is bothering her. 8/10 currently. Was excellent after last session. The heating pad and E-stim positive effects lasts for about a day or longer.  6/10 R hip currently.     Pertinent History  S/P R THA on 11/11/2017 (anterior approach). Pt fell and developed R hip joint pain which became arthritis. 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  Had home health treatment in which last Friday was  her last day.   Currently has a difficult time walking.  Hips feel heavy when trying to stand up.  Pt also states states having low back and R knee pain. Feels like her R hip, low back and R knee are working together.  Had a shot for her low back 2 weeks ago which helped. Going to have a shot for her R knee soon.   No falls within the past 6 months. Has a fear of falling .     Patient Stated Goals  Walk better.     Currently in Pain?  Yes    Pain Score  8    back pain, 6/10 R hip pain currently   Pain Onset  More than a month ago                               PT Education - 03/22/18 1123    Education Details  ther-ex    [employer]) Educated  Patient    Methods  Explanation;Demonstration;Tactile cues;Verbal cues    Comprehension  Returned demonstration;Verbalized understanding      Objectives  No latex band allergies  Medbridge Access Code: [email or web address]     Therapeutic Exerecise  BP L  arm sitting, mechanicallytaken, normal cuff 171/73, HR 58  No lightheadedness or dizziness, no blurred vision.    Gait with SPC, 120 ft, CGA to SBA, then 100 ft SBA  Gait with SPC stepping over 2 fallen canes CGA 2x  BP L arm sitting, mechanically taken, normal cuff. 155/96, HR 55  Sit <> stand from [employer] with emphasis on femoral control and placing and maintaining her center of gravity over her base of support 10x  Gait with SPC on mulch and mild incline outside with CGA   Standing R hip abduction with B UE assist 2 lbs ankle weight 10x   Sit <> stand from regular chair throughout session with cues for proper technique   Improved exercise technique, movement at target joints, use of target muscles after mod verbal, visual, tactile cues.    E-stim15 min each channel  High volte-stimto low backfor pain control. Sitting with heating pad to low back 15 minutes Channel 1: R lumbar paraspinal and posterior hip 160V Channel 2:  L lumbar paraspinal and posterior hip 160 V    Improving ability to ambulate with SPC on even surface. Worked on obstacle negotiation and walking over uneven surface. Pt able to step over fallen canes and ambulate over incline and mulch (uneven surface) safely with cues. Continued working glute med strengthening as well to promote femoral control. Pt tolerated session well without aggravation of symptoms. Pt states that her back felt much better after the heat and the E-stim. Pt will benefit from continued skilled physical therapy services to improve strength, function, and decrease back pain.        PT Short Term Goals - 03/08/18 1730      PT SHORT TERM GOAL #1   Title  Patient will be independent with her HEP to improve LE strength and ability to ambulate and perform standing tasks.     Time  3    Period  Weeks    Status  On-going    Target Date  03/31/18        PT Long Term Goals - 03/08/18 1730      PT LONG TERM GOAL #1   Title  Patient will improve R hip strength by at least 1/2 MMT grade to promote ability to ambulate and perform standing tasks.     Time  6    Period  Weeks    Status  Partially Met    Target Date  04/21/18      PT LONG TERM GOAL #2   Title  Patient will be able to ambulate at least 300 ft with [facility] to promote mobility    Baseline  Pt currently ambulates with rw (01/03/2018); (02/15/2018); 56 ft with SPC (03/08/2018)    Time  6    Period  Weeks    Status  On-going    Target Date  04/21/18      PT LONG TERM GOAL #3   Title  Patient will be able to ambulate at least 100 ft independently to promote mobility.     Baseline  Pt currently ambulates with rw. (01/03/2018); (02/15/2018); 56 ft with SPC (03/08/2018)    Time  6    Period  Weeks    Status  On-going    Target Date  04/21/18      PT LONG TERM GOAL #4   Title  Patient will be able to improve 10 Meter Walk Test measurement to at least 0.8 m/s with least restrictive AD to promote  community  ambulation.     Baseline  0.36 m/s with rw (01/03/2018); 0.71 m/s (02/15/2018); 0.72 m/s average with rw (03/08/2018).     Time  8    Period  Weeks    Status  Partially Met      PT LONG TERM GOAL #5   Title  Patient will have a decrease in back pain to 5/10 or less at worst to promote ability to perform standing tasks.     Baseline  8/10 back pain at worst for the past month (01/03/2018); 7/10 back pain at most for the past 7 days. 8/10 at most for the past 7 days (03/08/2018)    Time  6    Period  Weeks    Status  On-going    Target Date  04/21/18            Plan - 03/22/18 1123    Clinical Impression Statement  Improving ability to ambulate with SPC on even surface. Worked on obstacle negotiation and walking over uneven surface. Pt able to step over fallen canes and ambulate over incline and mulch (uneven surface) safely with cues. Continued working glute med strengthening as well to promote femoral control. Pt tolerated session well without aggravation of symptoms. Pt states that her back felt much better after the heat and the E-stim. Pt will benefit from continued skilled physical therapy services to improve strength, function, and decrease back pain.     Rehab Potential  Fair    Clinical Impairments Affecting Rehab Potential  (-) pain, weakness, age; (+) motivated    PT Frequency  2x / week    PT Duration  6 weeks    PT Treatment/Interventions  Therapeutic activities;Therapeutic exercise;Balance training;Neuromuscular re-education;Patient/family education;Manual techniques;Aquatic Therapy;Electrical Stimulation;Iontophoresis 41m/ml Dexamethasone;Gait training    PT Next Visit Plan  glute strengthening, femoral control, gait, trunk strengthening, manual techniques, modalities PRN    Consulted and Agree with Plan of Care  Patient       Patient will benefit from skilled therapeutic intervention in order to improve the following deficits and impairments:  Postural dysfunction, Pain,  Improper body mechanics, Decreased strength, Difficulty walking, Decreased range of motion  Visit Diagnosis: Pain in right hip  Muscle weakness (generalized)  Difficulty in walking, not elsewhere classified  Chronic bilateral low back pain, unspecified whether sciatica present     Problem List Patient Active Problem List   Diagnosis Date Noted  . Primary localized osteoarthritis of right hip 11/11/2017  . Symptomatic bradycardia 02/12/2017  . Chronic cough 06/18/2015  . Plantar fascial fibromatosis 06/18/2015  . Preventative health care 01/18/2015  . Paroxysmal atrial fibrillation ([place] 12/30/2014  . Hypertension, essential, benign 10/16/2014  . Mild persistent chronic asthma without complication 097/98/9211 . Allergic rhinitis 10/16/2014  . GERD (gastroesophageal reflux disease) 10/16/2014  . HLD (hyperlipidemia)   . Arthritis of knee, degenerative 09/07/2014  . DM (diabetes mellitus), type 2, uncontrolled ([facility] 10/26/2012    MJoneen BoersPT, DPT   03/22/2018, 12:36 PM  [facility] [address] [place] [address]Phone: [phone]  Fax:  [phone] Name: NJOSHUA ZERINGUEMRN: [id number] of Birth: 5July 29, 1942

## 2018-03-24 ENCOUNTER — Ambulatory Visit: Payer: Medicare Other

## 2018-03-24 DIAGNOSIS — R262 Difficulty in walking, not elsewhere classified: Secondary | ICD-10-CM

## 2018-03-24 DIAGNOSIS — G8929 Other chronic pain: Secondary | ICD-10-CM

## 2018-03-24 DIAGNOSIS — M25551 Pain in right hip: Secondary | ICD-10-CM

## 2018-03-24 DIAGNOSIS — M6281 Muscle weakness (generalized): Secondary | ICD-10-CM

## 2018-03-24 DIAGNOSIS — M545 Low back pain: Secondary | ICD-10-CM

## 2018-03-24 NOTE — Therapy (Signed)
[facility] [address] [place], [address] Phone: [phone]   Fax:  [phone]  Physical Therapy Treatment  Patient Details  Name: Mallory Arellano MRN: [id number] Date of Birth: November 20, 1940 Referring Provider (PT): Hessie Knows, MD   Encounter Date: 03/24/2018  PT End of Session - 03/24/18 1620    Visit Number  14    Number of Visits  29    Date for PT Re-Evaluation  04/21/18    Authorization Type  4    Authorization Time Period  of 10 progress report    PT Start Time  8676   pt arrived 21 minutes late   PT Stop Time  1716    PT Time Calculation (min)  55 min    Equipment Utilized During Treatment  Gait belt   pt rw   Activity Tolerance  Patient tolerated treatment well    Behavior During Therapy  WFL for tasks assessed/performed       Past Medical History:  Diagnosis Date  . A-fib ([place])   . Arthritis   . Asthma   . Diabetes mellitus without complication ([place])   . Dysrhythmia   . GERD (gastroesophageal reflux disease)   . Hypercholesteremia   . Hypertension   . Lower extremity edema   . Palpitation   . UTI (urinary tract infection)     Past Surgical History:  Procedure Laterality Date  . 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 WISDOM TOOTH EXTRACTION      There were no vitals filed for this visit.  Subjective Assessment - 03/24/18 1623    Subjective  Pt states having a busy day. The hip is coming along. No R hip pain. 5-6/10 back pain currently.  Standing up from a chair is getting better.     Pertinent History  S/P R THA on 11/11/2017 (anterior approach). Pt fell and developed R hip joint pain which became arthritis. 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  Had home health treatment in which last Friday was her last day.    Currently has a difficult time walking.  Hips feel heavy when trying to stand up.  Pt also states states having low back and R knee pain. Feels like her R hip, low back and R knee are working together.  Had a shot for her low back 2 weeks ago which helped. Going to have a shot for her R knee soon.   No falls within the past 6 months. Has a fear of falling .     Patient Stated Goals  Walk better.     Currently in Pain?  Yes    Pain Score  6     Pain Location  Back    Pain Onset  More than a month ago                               PT Education - 03/24/18 1732    Education Details  ther-ex    Person(s) Educated  Patient    Methods  Explanation;Demonstration;Tactile cues;Verbal cues    Comprehension  Returned demonstration;Verbalized understanding      Objectives  No latex band allergies  MedbridgeAccess Code: [email or web address]    Therapeutic Exerecise    BP  L arm sitting, mechanicallytaken, normal cuff183/57, HR 58             No lightheadedness or dizziness, no blurred vision.  180/58 L arm sitting, manually taken   Performed after E-stim:  Gait with SPC flat surface,175 ft SBA   Sit <>stand from regular chair throughout session with cues for proper technique    Gait with SPC on mulch multiple times and mild incline outside with CGA   Gait with SPC stepping over 2 fallen canes CGA 7x  (SBA on last fallen cane)   Forward step up onto Air ex pad with R LE and L UE assist 5x, then 10x emphasis on femoral control     Add forward step up onto 4 inch step next visit if appropriate.   Progress pt to [facility] when appropriate.      Improved exercise technique, movement at target joints, use of target muscles after min to mod verbal, visual, tactile cues.     E-stim15 min each channel  High volte-stimto low backfor pain control.Sittingwith heating pad to low back 15 minutes Channel 1: R lumbar paraspinal and  posterior hip 160V Channel 2: L lumbar paraspinal and posterior hip 160 V   Blood pressure L arm sitting, manually taken, normal cuff 168/58 towards end of e-stim.    Pt demonstrates improving ability to ambulate with SPC on even, uneven, and incline surfaces as well as stepping over an obstacle that protrudes 1-2 inches from the floor. Worked on step ups onto [employer] Ex pad with R LE to promote strength with emphasis on femoral control. Improving overall ability to stand up from a seated position observed. Pt however still needs cues to place and maintain her center of gravity over her base of support as well as with femoral control when performing transfers. Pt will benefit from continued skilled physical therapy services to improve strength, decrease back pain, improve ability to ambulate, and improve function.     PT Short Term Goals - 03/08/18 1730      PT SHORT TERM GOAL #1   Title  Patient will be independent with her HEP to improve LE strength and ability to ambulate and perform standing tasks.     Time  3    Period  Weeks    Status  On-going    Target Date  03/31/18        PT Long Term Goals - 03/08/18 1730      PT LONG TERM GOAL #1   Title  Patient will improve R hip strength by at least 1/2 MMT grade to promote ability to ambulate and perform standing tasks.     Time  6    Period  Weeks    Status  Partially Met    Target Date  04/21/18      PT LONG TERM GOAL #2   Title  Patient will be able to ambulate at least 300 ft with [facility] to promote mobility    Baseline  Pt currently ambulates with rw (01/03/2018); (02/15/2018); 56 ft with SPC (03/08/2018)    Time  6    Period  Weeks    Status  On-going    Target Date  04/21/18      PT LONG TERM GOAL #3   Title  Patient will be able to ambulate at least 100 ft independently to promote mobility.     Baseline  Pt currently ambulates with rw. (01/03/2018); (02/15/2018); 56 ft with SPC (03/08/2018)  Time  6    Period   Weeks    Status  On-going    Target Date  04/21/18      PT LONG TERM GOAL #4   Title  Patient will be able to improve 10 Meter Walk Test measurement to at least 0.8 m/s with least restrictive AD to promote community ambulation.     Baseline  0.36 m/s with rw (01/03/2018); 0.71 m/s (02/15/2018); 0.72 m/s average with rw (03/08/2018).     Time  8    Period  Weeks    Status  Partially Met      PT LONG TERM GOAL #5   Title  Patient will have a decrease in back pain to 5/10 or less at worst to promote ability to perform standing tasks.     Baseline  8/10 back pain at worst for the past month (01/03/2018); 7/10 back pain at most for the past 7 days. 8/10 at most for the past 7 days (03/08/2018)    Time  6    Period  Weeks    Status  On-going    Target Date  04/21/18            Plan - 03/24/18 1620    Clinical Impression Statement  Pt demonstrates improving ability to ambulate with SPC on even, uneven, and incline surfaces as well as stepping over an obstacle that protrudes 1-2 inches from the floor. Worked on step ups onto [employer] Ex pad with R LE to promote strength with emphasis on femoral control. Improving overall ability to stand up from a seated position observed. Pt however still needs cues to place and maintain her center of gravity over her base of support as well as with femoral control when performing transfers. Pt will benefit from continued skilled physical therapy services to improve strength, decrease back pain, improve ability to ambulate, and improve function.     Rehab Potential  Fair    Clinical Impairments Affecting Rehab Potential  (-) pain, weakness, age; (+) motivated    PT Frequency  2x / week    PT Duration  6 weeks    PT Treatment/Interventions  Therapeutic activities;Therapeutic exercise;Balance training;Neuromuscular re-education;Patient/family education;Manual techniques;Aquatic Therapy;Electrical Stimulation;Iontophoresis 5m/ml Dexamethasone;Gait training    PT  Next Visit Plan  glute strengthening, femoral control, gait, trunk strengthening, manual techniques, modalities PRN    Consulted and Agree with Plan of Care  Patient       Patient will benefit from skilled therapeutic intervention in order to improve the following deficits and impairments:  Postural dysfunction, Pain, Improper body mechanics, Decreased strength, Difficulty walking, Decreased range of motion  Visit Diagnosis: Pain in right hip  Muscle weakness (generalized)  Difficulty in walking, not elsewhere classified  Chronic bilateral low back pain, unspecified whether sciatica present     Problem List Patient Active Problem List   Diagnosis Date Noted  . Primary localized osteoarthritis of right hip 11/11/2017  . Symptomatic bradycardia 02/12/2017  . Chronic cough 06/18/2015  . Plantar fascial fibromatosis 06/18/2015  . Preventative health care 01/18/2015  . [place]ox[place] ([place] 12/30/2014  . Hypertension, essential, benign 10/16/2014  . Mild persistent chronic asthma without complication 000/93/8182 . Allergic rhinitis 10/16/2014  . GERD (gastroesophageal reflux disease) 10/16/2014  . HLD (hyperlipidemia)   . Arthritis of knee, degenerative 09/07/2014  . DM (diabetes mellitus), type 2, uncontrolled ([place] 10/26/2012    MJoneen BoersPT, DPT    03/24/2018, 5:38 PM  [facility] [address], [place], [address] Phone: [phone]   Fax:  [phone]  Name: Mallory Arellano MRN: [id number] Date of Birth: 1940/12/23

## 2018-03-29 ENCOUNTER — Ambulatory Visit: Payer: Medicare Other | Attending: Orthopedic Surgery

## 2018-03-29 DIAGNOSIS — M6281 Muscle weakness (generalized): Secondary | ICD-10-CM | POA: Insufficient documentation

## 2018-03-29 DIAGNOSIS — M545 Low back pain: Secondary | ICD-10-CM | POA: Insufficient documentation

## 2018-03-29 DIAGNOSIS — M25551 Pain in right hip: Secondary | ICD-10-CM | POA: Insufficient documentation

## 2018-03-29 DIAGNOSIS — R262 Difficulty in walking, not elsewhere classified: Secondary | ICD-10-CM | POA: Insufficient documentation

## 2018-03-29 DIAGNOSIS — G8929 Other chronic pain: Secondary | ICD-10-CM | POA: Insufficient documentation

## 2018-03-31 ENCOUNTER — Ambulatory Visit: Payer: Medicare Other

## 2018-04-05 ENCOUNTER — Ambulatory Visit: Payer: Medicare Other

## 2018-04-05 DIAGNOSIS — G8929 Other chronic pain: Secondary | ICD-10-CM

## 2018-04-05 DIAGNOSIS — M545 Low back pain, unspecified: Secondary | ICD-10-CM

## 2018-04-05 DIAGNOSIS — M6281 Muscle weakness (generalized): Secondary | ICD-10-CM

## 2018-04-05 DIAGNOSIS — R262 Difficulty in walking, not elsewhere classified: Secondary | ICD-10-CM | POA: Diagnosis present

## 2018-04-05 DIAGNOSIS — M25551 Pain in right hip: Secondary | ICD-10-CM | POA: Diagnosis present

## 2018-04-05 NOTE — Therapy (Signed)
[place] [facility] [address], [place], [address] Phone: [phone]   Fax:  [phone]  Physical Therapy Treatment  Patient Details  Name: Mallory Arellano MRN: [id number] Date of Birth: November 02, 1940 Referring Provider (PT): Hessie Knows, MD   Encounter Date: 04/05/2018  PT End of Session - 04/05/18 1048    Visit Number  15    Number of Visits  29    Date for PT Re-Evaluation  04/21/18    Authorization Type  5    Authorization Time Period  of 10 progress report    PT Start Time  1048    PT Stop Time  1155    PT Time Calculation (min)  67 min    Equipment Utilized During Treatment  Gait belt   pt rw   Activity Tolerance  Patient tolerated treatment well    Behavior During Therapy  WFL for tasks assessed/performed       Past Medical History:  Diagnosis Date  . A-fib ([place])   . Arthritis   . Asthma   . Diabetes mellitus without complication ([place])   . Dysrhythmia   . GERD (gastroesophageal reflux disease)   . Hypercholesteremia   . Hypertension   . Lower extremity edema   . Palpitation   . UTI (urinary tract infection)     Past Surgical History:  Procedure Laterality Date  . 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 WISDOM TOOTH EXTRACTION      There were no vitals filed for this visit.  Subjective Assessment - 04/05/18 1053    Subjective  Both eyes are itching this morning. Her eye doctor gave her drops for the itching.  States that her eye doctor is fine with her participating in PT today.   R hip feels like it is associated wiht her back.  Feels a shooting pain around the surgical area.  Back is a 9/10 currently (pt observed not in distress).   The E-stim helps with the back pain.     Pertinent History  S/P R THA on 11/11/2017 (anterior approach). Pt fell  and developed R hip joint pain which became arthritis. 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  Had home health treatment in which last Friday was her last day.   Currently has a difficult time walking.  Hips feel heavy when trying to stand up.  Pt also states states having low back and R knee pain. Feels like her R hip, low back and R knee are working together.  Had a shot for her low back 2 weeks ago which helped. Going to have a shot for her R knee soon.   No falls within the past 6 months. Has a fear of falling .     Patient Stated Goals  Walk better.     Currently in Pain?  Yes    Pain Score  9    back pain, pt not in distress   Pain Onset  More than a month ago                               PT Education - 04/05/18 1118    Education Details  ther-ex    [employer]) Educated  Patient    Methods  Explanation;Demonstration;Tactile cues;Verbal cues    Comprehension  Returned demonstration;Verbalized understanding         Objectives  No latex band allergies  MedbridgeAccess Code: [email or web address]    Therapeutic Exerecise    BP L arm sitting, mechanicallytaken, normal cuff141/63, HR51  Gait with SPC flat surface,220 ft SBA  forward step up onto and over 4 inch step with (up with the good and down with the bad technique) with SPC to promote negotiation with curb with SPC 10x. CGA to SBA. Able to perform without LOB  Forward step up onto 4 inch step with R LE and L UE assist 10x2  Standing R hip abduction 5x5 seconds to promote glute med strengthening  Then 10x2   Gait with SPC stepping over 2 fallen canes SBA 8x   No LOB.   Side Stepping with SPC 32 ft to the R and 32 ft to the L to promote glute med muscle strengthening.     Improved exercise technique, movement at target joints, use of target muscles after mod verbal, visual, tactile cues.        E-stim15 min each channel  High volte-stimto low  backfor pain control.Reclinedwith heating pad to low back 15 minutes Channel 1: R lumbar paraspinal and posterior hip 160V Channel 2: L lumbar paraspinal and posterior hip 160 V           Pt improving ability to ambulate on even surface with SPC, being able to ambulate up to 220 ft and step over obstacles (2 fallen canes) with SBA. Worked on step ups to promote ability to negotiate curbs safely with SPC. Ended with high volt e-stim for pain control for back secondary to pt stating that the treatment helps her back feel better. Pt will benefit from continued skilled physical therapy services to improve LE strength, ability to ambulate, improve balance, decrease pain, and improve function.    PT Short Term Goals - 03/08/18 1730      PT SHORT TERM GOAL #1   Title  Patient will be independent with her HEP to improve LE strength and ability to ambulate and perform standing tasks.     Time  3    Period  Weeks    Status  On-going    Target Date  03/31/18        PT Long Term Goals - 03/08/18 1730      PT LONG TERM GOAL #1   Title  Patient will improve R hip strength by at least 1/2 MMT grade to promote ability to ambulate and perform standing tasks.     Time  6    Period  Weeks    Status  Partially Met    Target Date  04/21/18      PT LONG TERM GOAL #2   Title  Patient will be able to ambulate at least 300 ft with [facility] to promote mobility    Baseline  Pt currently ambulates with rw (01/03/2018); (02/15/2018); 56 ft with SPC (03/08/2018)    Time  6    Period  Weeks    Status  On-going    Target Date  04/21/18      PT LONG TERM GOAL #3   Title  Patient will be able to ambulate at least 100 ft independently to promote mobility.     Baseline  Pt currently ambulates with rw. (01/03/2018); (02/15/2018); 56 ft with SPC (03/08/2018)    Time  6    Period  Weeks  Status  On-going    Target Date  04/21/18      PT LONG TERM GOAL #4   Title  Patient will be able  to improve 10 Meter Walk Test measurement to at least 0.8 m/s with least restrictive AD to promote community ambulation.     Baseline  0.36 m/s with rw (01/03/2018); 0.71 m/s (02/15/2018); 0.72 m/s average with rw (03/08/2018).     Time  8    Period  Weeks    Status  Partially Met      PT LONG TERM GOAL #5   Title  Patient will have a decrease in back pain to 5/10 or less at worst to promote ability to perform standing tasks.     Baseline  8/10 back pain at worst for the past month (01/03/2018); 7/10 back pain at most for the past 7 days. 8/10 at most for the past 7 days (03/08/2018)    Time  6    Period  Weeks    Status  On-going    Target Date  04/21/18            Plan - 04/05/18 1051    Clinical Impression Statement  Pt improving ability to ambulate on even surface with SPC, being able to ambulate up to 220 ft and step over obstacles (2 fallen canes) with SBA. Worked on step ups to promote ability to negotiate curbs safely with SPC. Ended with high volt e-stim for pain control for back secondary to pt stating that the treatment helps her back feel better. Pt will benefit from continued skilled physical therapy services to improve LE strength, ability to ambulate, improve balance, decrease pain, and improve function.     Rehab Potential  Fair    Clinical Impairments Affecting Rehab Potential  (-) pain, weakness, age; (+) motivated    PT Frequency  2x / week    PT Duration  6 weeks    PT Treatment/Interventions  Therapeutic activities;Therapeutic exercise;Balance training;Neuromuscular re-education;Patient/family education;Manual techniques;Aquatic Therapy;Electrical Stimulation;Iontophoresis 80m/ml Dexamethasone;Gait training    PT Next Visit Plan  glute strengthening, femoral control, gait, trunk strengthening, manual techniques, modalities PRN    Consulted and Agree with Plan of Care  Patient       Patient will benefit from skilled therapeutic intervention in order to improve the  following deficits and impairments:  Postural dysfunction, Pain, Improper body mechanics, Decreased strength, Difficulty walking, Decreased range of motion  Visit Diagnosis: Pain in right hip  Muscle weakness (generalized)  Difficulty in walking, not elsewhere classified  Chronic bilateral low back pain, unspecified whether sciatica present     Problem List Patient Active Problem List   Diagnosis Date Noted  . Primary localized osteoarthritis of right hip 11/11/2017  . Symptomatic bradycardia 02/12/2017  . Chronic cough 06/18/2015  . Plantar fascial fibromatosis 06/18/2015  . Preventative health care 01/18/2015  . Paroxysmal atrial fibrillation ([place] 12/30/2014  . Hypertension, essential, benign 10/16/2014  . Mild persistent chronic asthma without complication 097/94/8016 . Allergic rhinitis 10/16/2014  . GERD (gastroesophageal reflux disease) 10/16/2014  . HLD (hyperlipidemia)   . Arthritis of knee, degenerative 09/07/2014  . DM (diabetes mellitus), type 2, uncontrolled ([place] 10/26/2012    MJoneen BoersPT, DPT   04/05/2018, 12:21 PM  [facility] [address] [place] [address]Phone: [phone]  Fax:  [phone] Name: NKALONI BISAILLONMRN: [id number] of Birth: [birth year]

## 2018-04-07 ENCOUNTER — Ambulatory Visit: Payer: Medicare Other

## 2018-04-09 IMAGING — CR DG HUMERUS 2V *L*
2 series · 2 of 2 positions shown · non-contrast
Comparison: None.

CLINICAL DATA: Initial evaluation for acute trauma, fall.

EXAM:
LEFT HUMERUS - 2+ VIEW

[humerus ap]
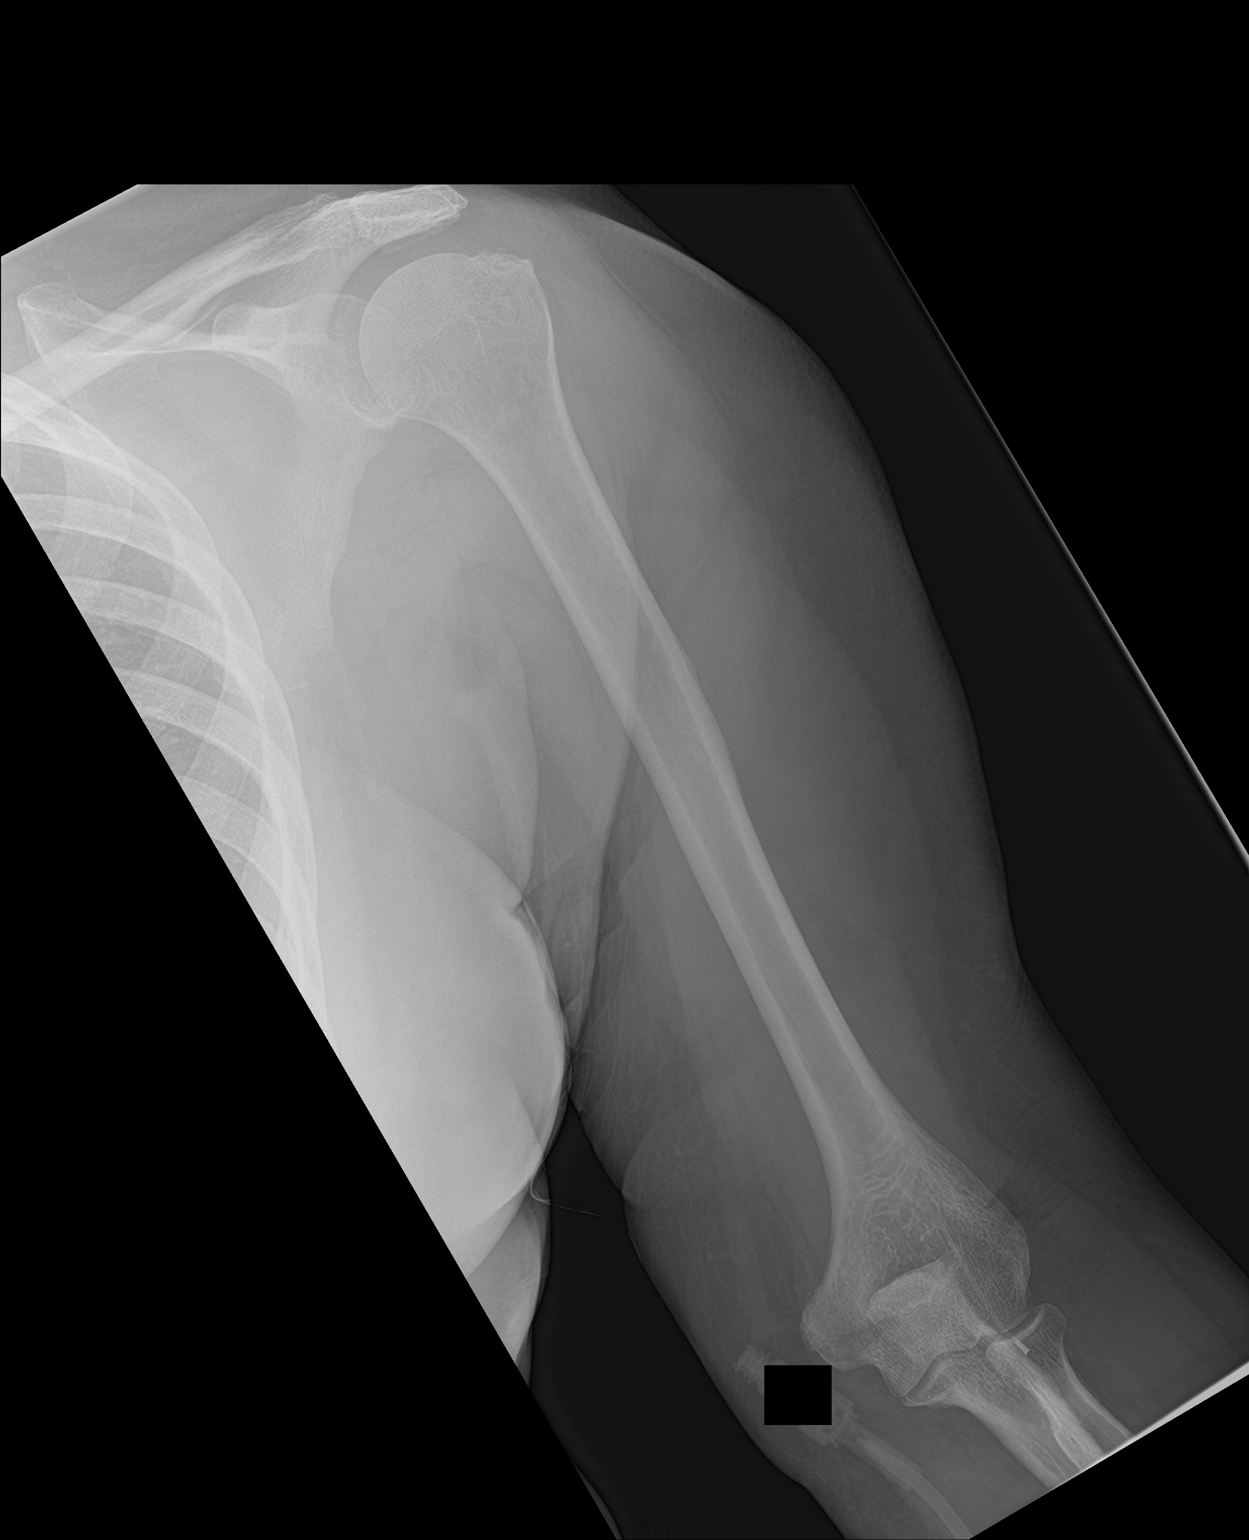

[humerus lat]
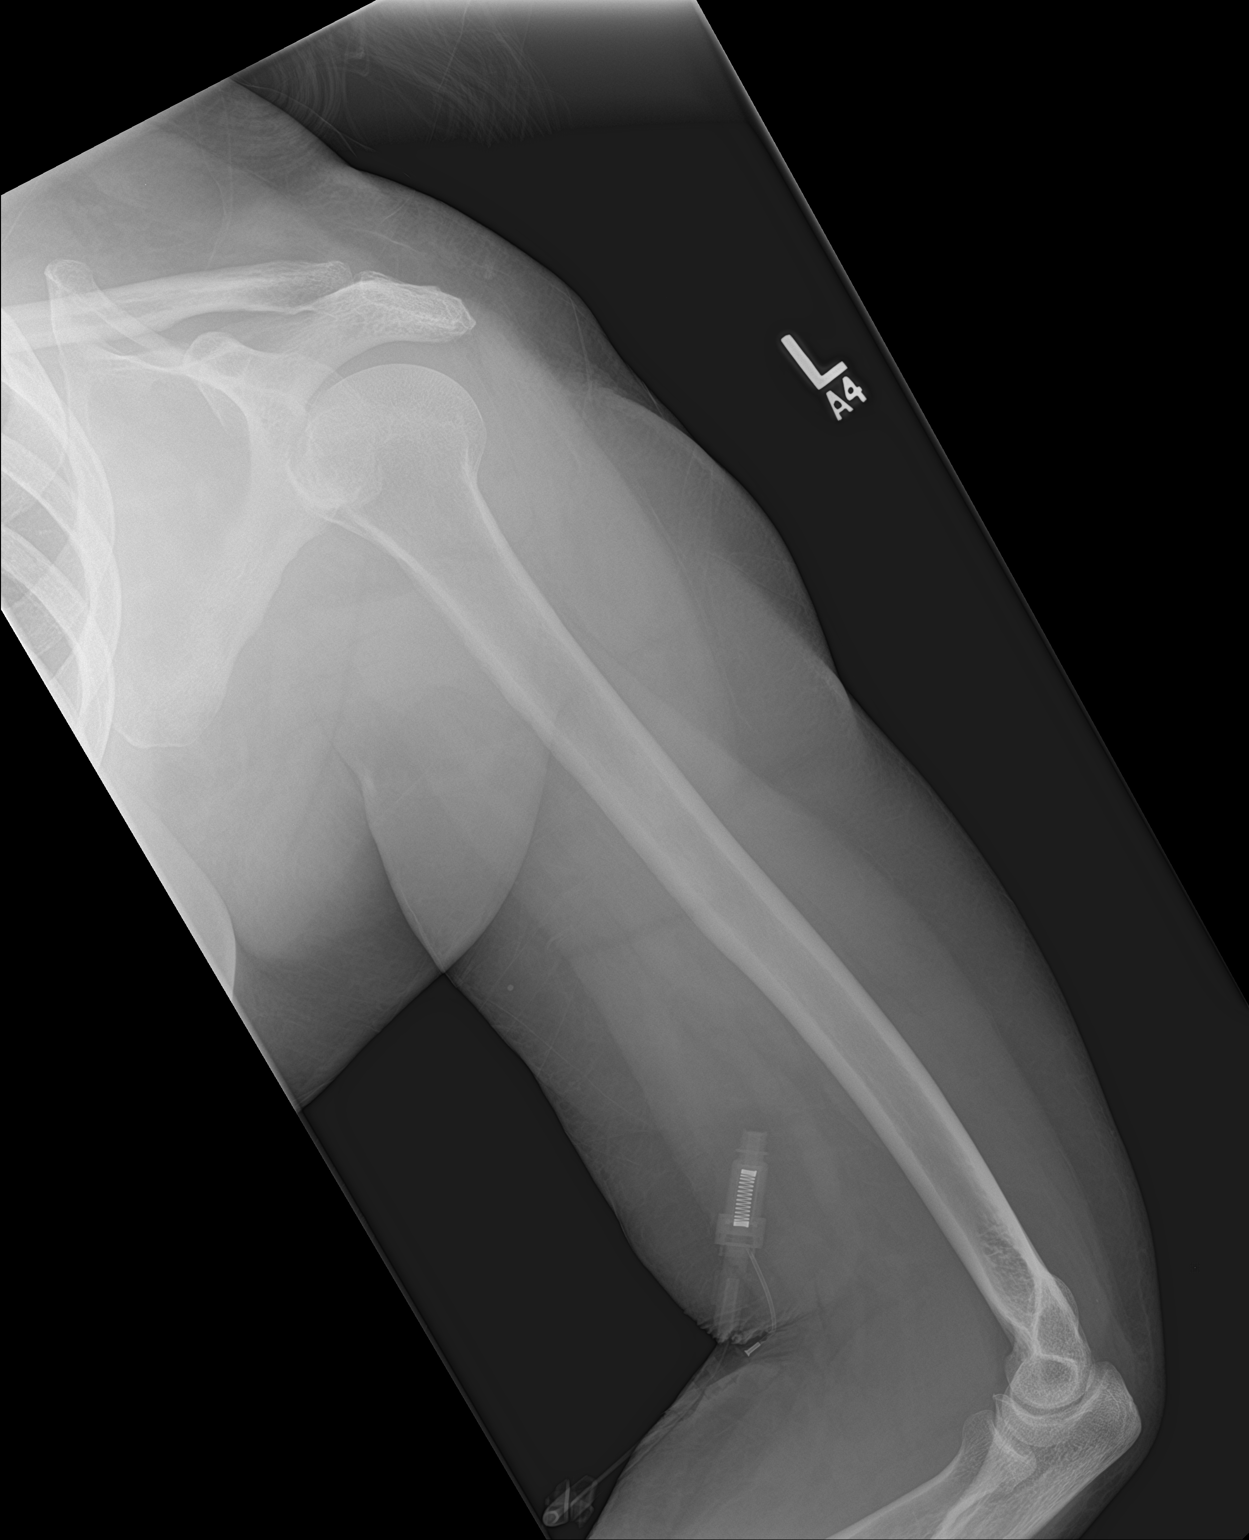

[2 of 2 positions shown; findings below may reference images not displayed]

FINDINGS: There is no evidence of fracture or other focal bone lesions. Soft
tissues are unremarkable.
IMPRESSION: Negative.

## 2018-04-12 ENCOUNTER — Ambulatory Visit: Payer: Medicare Other

## 2018-04-14 ENCOUNTER — Telehealth: Payer: Self-pay

## 2018-04-14 ENCOUNTER — Ambulatory Visit: Payer: Medicare Other

## 2018-04-14 NOTE — Telephone Encounter (Signed)
No show. Called patient and left a message pertaining to appointment and a reminder for the next follow up session. Return phone call requested. Phone number ([phone]) provided.   

## 2018-04-14 NOTE — Telephone Encounter (Signed)
Patient called back who said that she has had palpitations and was place on a monitor by doctor [place]. Has to hold off PT until she gets the results. Has to cancel her next appointment (04/19/2018). Might wait until after Thanksgiving to continue PT.

## 2018-04-19 ENCOUNTER — Ambulatory Visit: Payer: Medicare Other

## 2018-04-20 ENCOUNTER — Other Ambulatory Visit: Payer: Self-pay | Admitting: Sports Medicine

## 2018-04-20 DIAGNOSIS — M25512 Pain in left shoulder: Principal | ICD-10-CM

## 2018-04-20 DIAGNOSIS — G8929 Other chronic pain: Secondary | ICD-10-CM

## 2018-05-12 ENCOUNTER — Ambulatory Visit: Payer: Medicare Other

## 2019-06-26 ENCOUNTER — Other Ambulatory Visit: Payer: Self-pay | Admitting: Family Medicine

## 2019-06-26 DIAGNOSIS — Z780 Asymptomatic menopausal state: Secondary | ICD-10-CM

## 2019-08-11 ENCOUNTER — Other Ambulatory Visit: Payer: Self-pay

## 2019-08-11 ENCOUNTER — Emergency Department: Payer: Medicare Other

## 2019-08-11 DIAGNOSIS — Z7984 Long term (current) use of oral hypoglycemic drugs: Secondary | ICD-10-CM | POA: Diagnosis not present

## 2019-08-11 DIAGNOSIS — Z880 Allergy status to penicillin: Secondary | ICD-10-CM | POA: Insufficient documentation

## 2019-08-11 DIAGNOSIS — I480 Paroxysmal atrial fibrillation: Secondary | ICD-10-CM | POA: Insufficient documentation

## 2019-08-11 DIAGNOSIS — Z833 Family history of diabetes mellitus: Secondary | ICD-10-CM | POA: Diagnosis not present

## 2019-08-11 DIAGNOSIS — Z20822 Contact with and (suspected) exposure to covid-19: Secondary | ICD-10-CM | POA: Insufficient documentation

## 2019-08-11 DIAGNOSIS — J4520 Mild intermittent asthma, uncomplicated: Secondary | ICD-10-CM | POA: Insufficient documentation

## 2019-08-11 DIAGNOSIS — M1990 Unspecified osteoarthritis, unspecified site: Secondary | ICD-10-CM | POA: Diagnosis not present

## 2019-08-11 DIAGNOSIS — Z79899 Other long term (current) drug therapy: Secondary | ICD-10-CM | POA: Diagnosis not present

## 2019-08-11 DIAGNOSIS — R001 Bradycardia, unspecified: Secondary | ICD-10-CM | POA: Diagnosis not present

## 2019-08-11 DIAGNOSIS — Z7901 Long term (current) use of anticoagulants: Secondary | ICD-10-CM | POA: Diagnosis not present

## 2019-08-11 DIAGNOSIS — K219 Gastro-esophageal reflux disease without esophagitis: Secondary | ICD-10-CM | POA: Insufficient documentation

## 2019-08-11 DIAGNOSIS — Z8249 Family history of ischemic heart disease and other diseases of the circulatory system: Secondary | ICD-10-CM | POA: Insufficient documentation

## 2019-08-11 DIAGNOSIS — R079 Chest pain, unspecified: Secondary | ICD-10-CM | POA: Diagnosis not present

## 2019-08-11 DIAGNOSIS — I119 Hypertensive heart disease without heart failure: Secondary | ICD-10-CM | POA: Insufficient documentation

## 2019-08-11 DIAGNOSIS — D649 Anemia, unspecified: Secondary | ICD-10-CM | POA: Diagnosis not present

## 2019-08-11 DIAGNOSIS — E119 Type 2 diabetes mellitus without complications: Secondary | ICD-10-CM | POA: Insufficient documentation

## 2019-08-11 DIAGNOSIS — Z7951 Long term (current) use of inhaled steroids: Secondary | ICD-10-CM | POA: Insufficient documentation

## 2019-08-11 NOTE — ED Triage Notes (Signed)
Pt states began to have "heart palpitations" on Wednesday. Pt states was having left sided chest pain tonight "just a little". Pt states she was placed on cardizem on Wednesday, but continues to have palpitations. Pt states she has felt lightheaded.

## 2019-08-12 ENCOUNTER — Observation Stay
Admission: EM | Admit: 2019-08-12 | Discharge: 2019-08-13 | Disposition: A | Discharge status: Home / Self Care | Payer: Medicare Other | Attending: Internal Medicine | Admitting: Internal Medicine

## 2019-08-12 ENCOUNTER — Encounter: Payer: Self-pay | Admitting: Internal Medicine

## 2019-08-12 ENCOUNTER — Other Ambulatory Visit: Payer: Self-pay

## 2019-08-12 ENCOUNTER — Emergency Department: Payer: Medicare Other

## 2019-08-12 DIAGNOSIS — IMO0002 Reserved for concepts with insufficient information to code with codable children: Secondary | ICD-10-CM | POA: Diagnosis present

## 2019-08-12 DIAGNOSIS — E1165 Type 2 diabetes mellitus with hyperglycemia: Secondary | ICD-10-CM | POA: Diagnosis present

## 2019-08-12 DIAGNOSIS — R001 Bradycardia, unspecified: Secondary | ICD-10-CM

## 2019-08-12 DIAGNOSIS — J4530 Mild persistent asthma, uncomplicated: Secondary | ICD-10-CM | POA: Diagnosis present

## 2019-08-12 DIAGNOSIS — I480 Paroxysmal atrial fibrillation: Secondary | ICD-10-CM | POA: Diagnosis present

## 2019-08-12 DIAGNOSIS — E119 Type 2 diabetes mellitus without complications: Secondary | ICD-10-CM

## 2019-08-12 DIAGNOSIS — I10 Essential (primary) hypertension: Secondary | ICD-10-CM | POA: Diagnosis present

## 2019-08-12 LAB — CBC
HCT: 40.4 % (ref 36.0–46.0)
Hemoglobin: 13.4 g/dL (ref 12.0–15.0)
MCH: 29 pg (ref 26.0–34.0)
MCHC: 33.2 g/dL (ref 30.0–36.0)
MCV: 87.4 fL (ref 80.0–100.0)
Platelets: 280 10*3/uL (ref 150–400)
RBC: 4.62 MIL/uL (ref 3.87–5.11)
RDW: 13.9 % (ref 11.5–15.5)
WBC: 11.4 10*3/uL — ABNORMAL HIGH (ref 4.0–10.5)
nRBC: 0 % (ref 0.0–0.2)

## 2019-08-12 LAB — GLUCOSE, CAPILLARY
Glucose-Capillary: 112 mg/dL — ABNORMAL HIGH (ref 70–99)
Glucose-Capillary: 129 mg/dL — ABNORMAL HIGH (ref 70–99)
Glucose-Capillary: 134 mg/dL — ABNORMAL HIGH (ref 70–99)
Glucose-Capillary: 132 mg/dL — ABNORMAL HIGH (ref 70–99)
Glucose-Capillary: 96 mg/dL (ref 70–99)

## 2019-08-12 LAB — T4, FREE: Free T4: 0.99 ng/dL (ref 0.61–1.12)

## 2019-08-12 LAB — BASIC METABOLIC PANEL
Anion gap: 8 (ref 5–15)
BUN: 22 mg/dL (ref 8–23)
CO2: 29 mmol/L (ref 22–32)
Calcium: 9 mg/dL (ref 8.9–10.3)
Chloride: 99 mmol/L (ref 98–111)
Creatinine, Ser: 1.13 mg/dL — ABNORMAL HIGH (ref 0.44–1.00)
GFR calc Af Amer: 54 mL/min — ABNORMAL LOW (ref >60)
GFR calc non Af Amer: 47 mL/min — ABNORMAL LOW (ref >60)
Glucose, Bld: 161 mg/dL — ABNORMAL HIGH (ref 70–99)
Potassium: 4.5 mmol/L (ref 3.5–5.1)
Sodium: 136 mmol/L (ref 135–145)

## 2019-08-12 LAB — SARS CORONAVIRUS 2 (TAT 6-24 HRS): SARS Coronavirus 2: NEGATIVE

## 2019-08-12 LAB — MAGNESIUM: Magnesium: 2.1 mg/dL (ref 1.7–2.4)

## 2019-08-12 LAB — HEMOGLOBIN A1C
Hgb A1c MFr Bld: 8 % — ABNORMAL HIGH (ref 4.8–5.6)
Mean Plasma Glucose: 182.9 mg/dL

## 2019-08-12 LAB — TROPONIN I (HIGH SENSITIVITY)
Troponin I (High Sensitivity): 33 ng/L — ABNORMAL HIGH (ref <18)
Troponin I (High Sensitivity): 36 ng/L — ABNORMAL HIGH (ref <18)

## 2019-08-12 LAB — TSH: TSH: 1.073 u[IU]/mL (ref 0.350–4.500)

## 2019-08-12 MED ORDER — ONDANSETRON HCL 4 MG/2ML IJ SOLN: 4.0000 mg | Freq: Four times a day (QID) | INTRAMUSCULAR | Status: DC | PRN

## 2019-08-12 MED ORDER — INSULIN ASPART 100 UNIT/ML ~~LOC~~ SOLN: 0.0000 [IU] | Freq: Every day | SUBCUTANEOUS | Status: DC

## 2019-08-12 MED ORDER — RIVAROXABAN 20 MG PO TABS
20.0000 mg | ORAL_TABLET | Freq: Every day | ORAL | Status: DC
  Administered 2019-08-12 – 2019-08-13 (×2): 20 mg via ORAL
  Filled 2019-08-12 (×2): qty 1

## 2019-08-12 MED ORDER — INSULIN ASPART 100 UNIT/ML ~~LOC~~ SOLN
0.0000 [IU] | Freq: Three times a day (TID) | SUBCUTANEOUS | Status: DC
  Administered 2019-08-12: 2 [IU] via SUBCUTANEOUS
  Administered 2019-08-13: 3 [IU] via SUBCUTANEOUS
  Filled 2019-08-12 (×2): qty 1

## 2019-08-12 MED ORDER — ACETAMINOPHEN 650 MG RE SUPP: 650.0000 mg | Freq: Four times a day (QID) | RECTAL | Status: DC | PRN

## 2019-08-12 MED ORDER — SENNOSIDES-DOCUSATE SODIUM 8.6-50 MG PO TABS: 1.0000 | ORAL_TABLET | Freq: Every evening | ORAL | Status: DC | PRN

## 2019-08-12 MED ORDER — ACETAMINOPHEN 325 MG PO TABS: 650.0000 mg | ORAL_TABLET | Freq: Four times a day (QID) | ORAL | Status: DC | PRN

## 2019-08-12 MED ORDER — ONDANSETRON HCL 4 MG PO TABS
4.0000 mg | ORAL_TABLET | Freq: Four times a day (QID) | ORAL | Status: DC | PRN
  Administered 2019-08-13: 4 mg via ORAL
  Filled 2019-08-12: qty 1

## 2019-08-12 MED ORDER — SODIUM CHLORIDE 0.9 % IV SOLN: INTRAVENOUS | Status: DC

## 2019-08-12 NOTE — Consult Note (Signed)
CARDIOLOGY CONSULT NOTE               Patient ID: Mallory Arellano MRN: [id number] DOB/AGE: Apr 17, 1941 79 y.o.  Admit date: 08/12/2019 Referring Physician Dr. Judd Gaudier hospitalist Primary Physician Dr. Kathrynn Ducking primary Primary Cardiologist [facility] Reason for Consultation bradycardia symptomatic paroxysmal atrial fibrillation  HPI: 79 year old black female history of paroxysmal fibrillation on flecainide metoprolol recently started on Cardizem low-dose as needed for palpitations she has had almost daily palpitations hypertension GERD diabetes asthma hyperlipidemia.  Patient states she had been feeling good for the last few days.  Because of generalized weakness fatigue and not feeling well she finally came to the emergency room denied any chest pain no nausea vomiting diarrhea or fever but complained of some lightheadedness and vertigo.  Patient has been compliant with the medication so presented for further assessment she has been having low heart rate for quite some time but is done reasonably well but has had multiple recurrent daily episodes of palpitations.  Review of systems complete and found to be negative unless listed above     Past Medical History:  Diagnosis Date  . A-fib ([place])   . Arthritis   . Asthma   . Diabetes mellitus without complication ([place])   . Dysrhythmia   . GERD (gastroesophageal reflux disease)   . Hypercholesteremia   . Hypertension   . Lower extremity edema   . Palpitation   . UTI (urinary tract infection)     Past Surgical History:  Procedure Laterality Date  . 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 WISDOM TOOTH EXTRACTION      (Not in a hospital admission)  Social History   Socioeconomic History  . Marital status: Widowed    Spouse name: Not on file  . Number of children: Not on file    . Years of education: Not on file  . Highest education level: Not on file  Occupational History  . Not on file  Tobacco Use  . Smoking status: Never Smoker  . Smokeless tobacco: Never Used  Substance and Sexual Activity  . Alcohol use: No    Alcohol/week: 0.0 standard drinks    Comment: occ  . Drug use: No  . Sexual activity: Not Currently    Partners: Male    Birth control/protection: Post-menopausal  Other Topics Concern  . Not on file  Social History Narrative  . Not on file   Social Determinants of Health   Financial Resource Strain:   . Difficulty of Paying Living Expenses:   Food Insecurity:   . Worried About Charity fundraiser in the Last Year:   . Arboriculturist in the Last Year:   Transportation Needs:   . Film/video editor (Medical):   Marland Kitchen Lack of Transportation (Non-Medical):   Physical Activity:   . Days of Exercise per Week:   . Minutes of Exercise per Session:   Stress:   . Feeling of Stress :   Social Connections:   . Frequency of Communication with Friends and Family:   . Frequency of Social Gatherings with Friends and Family:   . Attends Religious Services:   . Active Member of Clubs or Organizations:   . Attends Archivist Meetings:   Marland Kitchen Marital Status:   Intimate Partner Violence:   . Fear of Current or Ex-Partner:   .  Emotionally Abused:   Marland Kitchen Physically Abused:   . Sexually Abused:     Family History  Problem Relation Age of Onset  . Diabetes Mother   . Hypertension Mother   . Hypertension Brother   . Diabetes Brother       Review of systems complete and found to be negative unless listed above      PHYSICAL EXAM  General: Well developed, well nourished, in no acute distress HEENT:  Normocephalic and atramatic Neck:  No JVD.  Lungs: Clear bilaterally to auscultation and percussion. Heart: Bradycardia. Normal S1 and S2 without gallops or 2/6 sem murmurs.  Abdomen: Bowel sounds are positive, abdomen soft and  non-tender  Msk:  Back normal, normal gait. Normal strength and tone for age. Extremities: No clubbing, cyanosis or edema.   Neuro: Alert and oriented X 3. Psych:  Good affect, responds appropriately  Labs:   Lab Results  Component Value Date   WBC 11.4 (H) 08/12/2019   HGB 13.4 08/12/2019   HCT 40.4 08/12/2019   MCV 87.4 08/12/2019   PLT 280 08/12/2019    Recent Labs  Lab 08/12/19 0000  NA 136  K 4.5  CL 99  CO2 29  BUN 22  CREATININE 1.13*  CALCIUM 9.0  GLUCOSE 161*   Lab Results  Component Value Date   TROPONINI 0.07 (HH) 02/13/2017    Lab Results  Component Value Date   CHOL 127 01/18/2015   Lab Results  Component Value Date   HDL 42.20 01/18/2015   Lab Results  Component Value Date   LDLCALC 60 01/18/2015   Lab Results  Component Value Date   TRIG 123.0 01/18/2015   Lab Results  Component Value Date   CHOLHDL 3 01/18/2015   No results found for: LDLDIRECT    Radiology: DG Chest 2 View  Result Date: 08/12/2019 CLINICAL DATA:  Chest pain EXAM: CHEST - 2 VIEW COMPARISON:  02/14/2017 FINDINGS: Cardiomegaly. Lungs clear. No effusions or edema. No acute bony abnormality. IMPRESSION: Cardiomegaly.  No active disease. Electronically Signed   By: Rolm Baptise M.D.   On: 08/12/2019 01:08    EKG: Sinus bradycardia heart rate in the 40s nonspecific ST-T wave changes  ASSESSMENT AND PLAN:  Bradycardia symptomatic  Paroxysmal atrial fibrillation Hypertension Diabetes type 2 Asthma Palpitations Dysrhythmia GERD Hyperlipidemia Vertigo . Plan Agree with admit to telemetry Recommend hold flecainide metoprolol Cardizem Would continue Xarelto for anticoagulation Gentle hydration Agree with inhalers for asthma therapy Diabetes on glimepiride continue therapy follow-up with primary physician A1c of around 8 Hypertension management with HCTZ has been on Toprol but will continue to hold beta-blockers for now because of bradycardia Hyperlipidemia currently  not on anticoagulation unable to tolerate statin therapy Maintain Protonix therapy for reflux symptoms Preop for eye surgery on Monday No clear indication for permanent pacemaker at this point Recommend conservative cardiac input  Signed: Yolonda Kida MD 08/12/2019, 11:06 AM

## 2019-08-12 NOTE — ED Notes (Signed)
Pt given hot coffee.

## 2019-08-12 NOTE — ED Provider Notes (Signed)
[facility] Emergency Department Provider Note  ____________________________________________   First MD Initiated Contact with Patient 08/12/19 0009     (approximate)  I have reviewed the triage vital signs and the nursing notes.   HISTORY  Chief Complaint Chest Pain    HPI Mallory Arellano is a 79 y.o. female with diabetes, A. fib who is on flecainide, metoprolol, Xarelto asthma who comes in with chest pain.  Patient is followed by Dr. Clayborn Bigness.  Patient states that they recently went down her metoprolol but started her on diltiazem given palpitations.  However she noticed that her heart rates were in the low 30s to 40s which was different than her prior.  She noticed a little bit more lightheadedness with walking around feeling fatigue that is intermittent, worse with movement, better at rest.  She denied any significant chest pain, shortness of breath, abdominal pain.  At rest she feels completely fine at this time.          Past Medical History:  Diagnosis Date  . A-fib ([place])   . Arthritis   . Asthma   . Diabetes mellitus without complication ([place])   . Dysrhythmia   . GERD (gastroesophageal reflux disease)   . Hypercholesteremia   . Hypertension   . Lower extremity edema   . Palpitation   . UTI (urinary tract infection)     Patient Active Problem List   Diagnosis Date Noted  . Primary localized osteoarthritis of right hip 11/11/2017  . Symptomatic bradycardia 02/12/2017  . Chronic cough 06/18/2015  . Plantar fascial fibromatosis 06/18/2015  . Preventative health care 01/18/2015  . Paroxysmal atrial fibrillation ([place]) 12/30/2014  . Hypertension, essential, benign 10/16/2014  . Mild persistent chronic asthma without complication 0000000  . Allergic rhinitis 10/16/2014  . GERD (gastroesophageal reflux disease) 10/16/2014  . HLD (hyperlipidemia)   . Arthritis of knee, degenerative 09/07/2014  . DM (diabetes mellitus), type 2,  uncontrolled ([place]) 10/26/2012    Past Surgical History:  Procedure Laterality Date  . 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 WISDOM TOOTH EXTRACTION      Prior to Admission medications   Medication Sig Start Date End Date Taking? Authorizing Provider  albuterol (PROVENTIL HFA;VENTOLIN HFA) 108 (90 BASE) MCG/ACT inhaler Inhale 2 puffs into the lungs every 6 (six) hours as needed for wheezing or shortness of breath.     [provider]  albuterol (PROVENTIL) (2.5 MG/3ML) 0.083% nebulizer solution Take 2.5 mg by nebulization every 6 (six) hours as needed for wheezing or shortness of breath.    [provider]  budesonide-formoterol (SYMBICORT) 160-4.5 MCG/ACT inhaler Inhale 2 puffs into the lungs 2 (two) times daily.    [provider]  docusate sodium (COLACE) 100 MG capsule Take 1 capsule (100 mg total) by mouth 2 (two) times daily. 11/13/17   Duanne Guess, PA-C  flecainide (TAMBOCOR) 50 MG tablet Take 1 tablet (50 mg total) by mouth every 12 (twelve) hours. 02/15/17   Vaughan Basta, MD  furosemide (LASIX) 20 MG tablet Take 1 tablet (20 mg total) by mouth daily. Patient taking differently: Take 20 mg by mouth daily as needed for fluid or edema.  03/07/15   Coral Spikes, DO  glimepiride (AMARYL) 4 MG tablet Take 4 mg by mouth daily with breakfast.    [provider]  hydrochlorothiazide (HYDRODIURIL)  25 MG tablet Take 25 mg by mouth daily.    [provider]  HYDROcodone-acetaminophen (NORCO/VICODIN) 5-325 MG tablet Take 1-2 tablets by mouth every 4 (four) hours as needed for moderate pain (pain score 4-6). 11/13/17   Duanne Guess, PA-C  hydrOXYzine (ATARAX/VISTARIL) 25 MG tablet Take 25 mg by mouth 3 (three) times daily as needed for itching.     [provider]  methocarbamol  (ROBAXIN) 500 MG tablet Take 1 tablet (500 mg total) by mouth every 6 (six) hours as needed for muscle spasms. 11/13/17   Duanne Guess, PA-C  metoprolol succinate (TOPROL-XL) 50 MG 24 hr tablet Take 50 mg by mouth daily. Take with or immediately following a meal.    [provider]  milk thistle 175 MG tablet Take 175 mg by mouth daily.    [provider]  Multiple Vitamins-Minerals (ADVANCED DIABETIC MULTIVITAMIN PO) Take 1 tablet by mouth daily.    [provider]  pantoprazole (PROTONIX) 40 MG tablet Take 40 mg by mouth 2 (two) times daily.  09/05/14   [provider]  rivaroxaban (XARELTO) 20 MG TABS tablet Take 20 mg by mouth daily with breakfast.    [provider]  SUMAtriptan (IMITREX) 25 MG tablet Take 1 tablet by mouth every 2 (two) hours as needed for migraine.     [provider]  traMADol (ULTRAM) 50 MG tablet Take 1 tablet (50 mg total) by mouth every 6 (six) hours. 11/13/17   Duanne Guess, PA-C    Allergies Losartan, Sitagliptin, Metformin and related, Warfarin sodium, and Penicillins  Family History  Problem Relation Age of Onset  . Diabetes Mother   . Hypertension Mother   . Hypertension Brother   . Diabetes Brother     Social History Social History   Tobacco Use  . Smoking status: Never Smoker  . Smokeless tobacco: Never Used  Substance Use Topics  . Alcohol use: No    Alcohol/week: 0.0 standard drinks    Comment: occ  . Drug use: No      Review of Systems Constitutional: No fever/chills, positive lightheadedness Eyes: No visual changes. ENT: No sore throat. Cardiovascular: Positive palpitations Respiratory: Denies shortness of breath. Gastrointestinal: No abdominal pain.  No nausea, no vomiting.  No diarrhea.  No constipation. Genitourinary: Negative for dysuria. Musculoskeletal: Negative for back pain. Skin: Negative for rash. Neurological: Negative for headaches, focal weakness or  numbness. All other ROS negative ____________________________________________   PHYSICAL EXAM:  VITAL SIGNS: ED Triage Vitals  Enc Vitals Group     BP 08/11/19 2354 (!) 166/56     Pulse Rate 08/11/19 2350 (!) 34     Resp 08/11/19 2350 14     Temp 08/11/19 2350 98.2 F (36.8 C)     Temp Source 08/11/19 2350 Oral     SpO2 08/11/19 2350 97 %     Weight 08/11/19 2352 185 lb (83.9 kg)     Height 08/11/19 2352 5\' 6"  (1.676 m)     Head Circumference --      Peak Flow --      Pain Score 08/11/19 2350 0     Pain Loc --      Pain Edu? --      Excl. in [place]? --     Constitutional: Alert and oriented. Well appearing and in no acute distress. Eyes: Conjunctivae are normal. EOMI. Head: Atraumatic. Nose: No congestion/rhinnorhea. Mouth/Throat: Mucous membranes are moist.   Neck: No stridor. Trachea  Midline. FROM Cardiovascular: Bradycardia. Grossly normal heart sounds.  Good peripheral circulation. Respiratory: Normal respiratory effort.  No retractions. Lungs CTAB. Gastrointestinal: Soft and nontender. No distention. No abdominal bruits.  Musculoskeletal: No lower extremity tenderness nor edema.  No joint effusions. Neurologic:  Normal speech and language. No gross focal neurologic deficits are appreciated.  Skin:  Skin is warm, dry and intact. No rash noted. Psychiatric: Mood and affect are normal. Speech and behavior are normal. GU: Deferred   ____________________________________________   LABS (all labs ordered are listed, but only abnormal results are displayed)  Labs Reviewed  CBC - Abnormal; Notable for the following components:      Result Value   WBC 11.4 (*)    All other components within normal limits  BASIC METABOLIC PANEL - Abnormal; Notable for the following components:   Glucose, Bld 161 (*)    Creatinine, Ser 1.13 (*)    GFR calc non Af Amer 47 (*)    GFR calc Af Amer 54 (*)    All other components within normal limits  TROPONIN I (HIGH SENSITIVITY) -  Abnormal; Notable for the following components:   Troponin I (High Sensitivity) 33 (*)    All other components within normal limits  TSH  T4, FREE  MAGNESIUM  TROPONIN I (HIGH SENSITIVITY)   ____________________________________________   ED ECG REPORT I, Vanessa [place], the attending physician, personally viewed and interpreted this ECG.  Initial EKG was sinus rate of 40, no ST elevations, no T wave inversions, left anterior fascicular block  Repeat EKG is sinus rate of 35 with a PVC with a left anterior fascicular block T wave inversion in aVL and lead I  Repeat EKG sinus rate of 50, no ST elevations, no T wave inversions, normal intervals ____________________________________________  RADIOLOGY Robert Bellow, personally viewed and evaluated these images (plain radiographs) as part of my medical decision making, as well as reviewing the written report by the radiologist.  ED MD interpretation: No pneumonia   Official radiology report(s): DG Chest 2 View  Result Date: 08/12/2019 CLINICAL DATA:  Chest pain EXAM: CHEST - 2 VIEW COMPARISON:  02/14/2017 FINDINGS: Cardiomegaly. Lungs clear. No effusions or edema. No acute bony abnormality. IMPRESSION: Cardiomegaly.  No active disease. Electronically Signed   By: Rolm Baptise M.D.   On: 08/12/2019 01:08    ____________________________________________   PROCEDURES  Procedure(s) performed (including Critical Care):  .1-3 Lead EKG Interpretation Performed by: Vanessa [place], MD Authorized by: Vanessa [place], MD     Interpretation: abnormal     ECG rate:  30-40   ECG rate assessment: bradycardic     Rhythm: sinus bradycardia     Ectopy: PAC     Conduction: normal       ____________________________________________   INITIAL IMPRESSION / ASSESSMENT AND PLAN / ED COURSE   Mallory Arellano was evaluated in Emergency Department on 08/12/2019 for the symptoms described in the history of present illness. She was evaluated in the  context of the global COVID-19 pandemic, which necessitated consideration that the patient might be at risk for infection with the SARS-CoV-2 virus that causes COVID-19. Institutional protocols and algorithms that pertain to the evaluation of patients at risk for COVID-19 are in a state of rapid change based on information released by regulatory bodies including the CDC and federal and state organizations. These policies and algorithms were followed during the patient's care in the ED.    Most Likely DDx:  -Patient presents  with some lightheadedness with bradycardia most likely secondary to starting her diltiazem.  At this time patient is not hypotensive and is asymptomatic with sitting.  I think we can hold off on atropine but will keep on the cardiac monitor to closely monitor for progression into block.   DDx that was also considered d/t potential to cause harm, but was found less likely based on history and physical (as detailed above): -PNA (no fevers, cough but CXR to evaluate) -PNX (reassured with equal b/l breath sounds, CXR to evaluate) -Symptomatic anemia (will get H&H) -Pulmonary embolism as no sob at rest, not pleuritic in nature, no hypoxia -Aortic Dissection as no tearing pain and no radiation to the mid back, pulses equal -Pericarditis no rub on exam, EKG changes or hx to suggest dx -Tamponade (no notable SOB, tachycardic, hypotensive) -Esophageal rupture (no h/o diffuse vomitting/no crepitus)   I did message Dr. Clayborn Bigness so that he was aware of patient although he is currently in a procedure.  Patient has been here for over an hour and a half and has remained hemodynamically stable.  Labs show a slightly elevated troponin and slightly elevated creatinine.  Heart rates have already improved from the 30s up into the 40s with less PACs on the monitors.  Will discuss with the hospital team for admission for continued monitoring of her bradycardia       ____________________________________________   FINAL CLINICAL IMPRESSION(S) / ED DIAGNOSES   Final diagnoses:  Bradycardia     MEDICATIONS GIVEN DURING THIS VISIT:  Medications - No data to display   ED Discharge Orders    None       Note:  This document was prepared using Dragon voice recognition software and may include unintentional dictation errors.   Vanessa [place], MD 08/12/19 [phone]

## 2019-08-12 NOTE — Plan of Care (Signed)

## 2019-08-12 NOTE — Progress Notes (Signed)
   08/12/19 2000  Clinical Encounter Type  Visited With Patient and family together  Visit Type Initial  Referral From Nurse  Consult/Referral To Chaplain  Spiritual Encounters  Spiritual Needs Prayer  Stress Factors  Patient Stress Factors Health changes   This was an order req for an AD. Chaplain gave patient instruction on AD and spoke with patient about her life. Patient was friendly and asked for prayer. Patients daughter came in during this time. Chaplain will return at a later time for prayer.

## 2019-08-12 NOTE — ED Notes (Signed)
Pt provided basic hygiene products. Ambulated to sink with steady gait to clean self. Provided menu and number for dietary to place breakfast order.

## 2019-08-12 NOTE — ED Notes (Signed)
Pt ambulatory to toilet with steady gait. Denies recent falls. Denies feeling dizzy at this time. Instructed how to pull red cord if any help needed. Pt verbalized understanding.

## 2019-08-12 NOTE — ED Notes (Signed)
Pt breakfast tray that pt ordered arrived to ER. Pt sitting up on side of bed eating breakfast tray at this time.

## 2019-08-12 NOTE — Progress Notes (Signed)
Pt arrived to room 244 via [place] from ED.  Pt settled into room and oriented to safety features.  Call bell within reach, bed low, brakes locked, and possessions within reach.

## 2019-08-12 NOTE — H&P (Signed)
History and Physical    ADILENNE Arellano [id number] DOB: April 30, 1941 DOA: 08/12/2019  PCP: Sharyne Peach, MD   Patient coming from: home I have personally briefly reviewed patient's old medical records in [place]  Chief Complaint: lightheadedness  HPI: Mallory Arellano is a 79 y.o. female with medical history significant for paroxysmal A. fib on flecainide, Xarelto and metoprolol which was recently switched to Cardizem who presents to the emergency room with lightheadedness and with reports of heart rate in the 30s and 40s at home.  She denies chest pain or shortness of breath.  Denies nausea vomiting or diaphoresis.  ED Course: On arrival in the emergency room, heart rate was 34 with otherwise normal vitals, improving to 50 with IV hydration.  Troponin was 33.  Otherwise blood work unremarkable.  EKG showed sinus bradycardia.  Chest x-ray no acute disease .  Observation requested  Review of Systems: As per HPI otherwise 10 point review of systems negative.    Past Medical History:  Diagnosis Date  . A-fib ([place])   . Arthritis   . Asthma   . Diabetes mellitus without complication ([place])   . Dysrhythmia   . GERD (gastroesophageal reflux disease)   . Hypercholesteremia   . Hypertension   . Lower extremity edema   . Palpitation   . UTI (urinary tract infection)     Past Surgical History:  Procedure Laterality Date  . 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 WISDOM TOOTH EXTRACTION       reports that she has never smoked. She has never used smokeless tobacco. She reports that she does not drink alcohol or use drugs.  Allergies  Allergen Reactions  . Losartan Cough  . Sitagliptin Swelling    ABDOMINAL SWELLING  . Metformin And Related Other (See Comments)    Stomach swelling  . Warfarin Sodium Other (See Comments)   Patient didn't know what happened but states she couldn't take it  . Penicillins Itching    Has patient had a PCN reaction causing immediate rash, facial/tongue/throat swelling, SOB or lightheadedness with hypotension: No Has patient had a PCN reaction causing severe rash involving mucus membranes or skin necrosis: No Has patient had a PCN reaction that required hospitalization: No Has patient had a PCN reaction occurring within the last 10 years: No If all of the above answers are "NO", then may proceed with Cephalosporin use.     Family History  Problem Relation Age of Onset  . Diabetes Mother   . Hypertension Mother   . Hypertension Brother   . Diabetes Brother      Prior to Admission medications   Medication Sig Start Date End Date Taking? Authorizing Provider  albuterol (PROVENTIL HFA;VENTOLIN HFA) 108 (90 BASE) MCG/ACT inhaler Inhale 2 puffs into the lungs every 6 (six) hours as needed for wheezing or shortness of breath.     [provider]  albuterol (PROVENTIL) (2.5 MG/3ML) 0.083% nebulizer solution Take 2.5 mg by nebulization every 6 (six) hours as needed for wheezing or shortness of breath.    [provider]  budesonide-formoterol (SYMBICORT) 160-4.5 MCG/ACT inhaler Inhale 2 puffs into the lungs 2 (two) times daily.    [provider]  docusate sodium (COLACE) 100 MG capsule Take 1 capsule (100 mg total) by mouth 2 (two) times daily. 11/13/17   Dorise Hiss  C, PA-C  flecainide (TAMBOCOR) 50 MG tablet Take 1 tablet (50 mg total) by mouth every 12 (twelve) hours. 02/15/17   [place]aughan Basta, MD  furosemide (LASIX) 20 MG tablet Take 1 tablet (20 mg total) by mouth daily. Patient taking differently: Take 20 mg by mouth daily as needed for fluid or edema.  03/07/15   Coral Spikes, DO  glimepiride (AMARYL) 4 MG tablet Take 4 mg by mouth daily with breakfast.    [provider]  hydrochlorothiazide (HYDRODIURIL) 25 MG tablet Take 25 mg by  mouth daily.    [provider]  HYDROcodone-acetaminophen (NORCO/VICODIN) 5-325 MG tablet Take 1-2 tablets by mouth every 4 (four) hours as needed for moderate pain (pain score 4-6). 11/13/17   Duanne Guess, PA-C  hydrOXYzine (ATARAX/VISTARIL) 25 MG tablet Take 25 mg by mouth 3 (three) times daily as needed for itching.     [provider]  methocarbamol (ROBAXIN) 500 MG tablet Take 1 tablet (500 mg total) by mouth every 6 (six) hours as needed for muscle spasms. 11/13/17   Duanne Guess, PA-C  metoprolol succinate (TOPROL-XL) 50 MG 24 hr tablet Take 50 mg by mouth daily. Take with or immediately following a meal.    [provider]  milk thistle 175 MG tablet Take 175 mg by mouth daily.    [provider]  Multiple Vitamins-Minerals (ADVANCED DIABETIC MULTIVITAMIN PO) Take 1 tablet by mouth daily.    [provider]  pantoprazole (PROTONIX) 40 MG tablet Take 40 mg by mouth 2 (two) times daily.  09/05/14   [provider]  rivaroxaban (XARELTO) 20 MG TABS tablet Take 20 mg by mouth daily with breakfast.    [provider]  SUMAtriptan (IMITREX) 25 MG tablet Take 1 tablet by mouth every 2 (two) hours as needed for migraine.     [provider]  traMADol (ULTRAM) 50 MG tablet Take 1 tablet (50 mg total) by mouth every 6 (six) hours. 11/13/17   Duanne Guess, PA-C    Physical Exam: Vitals:   08/11/19 2350 08/11/19 2352 08/11/19 2354 08/12/19 0030  BP:   (!) 166/56 (!) 125/40  Pulse: (!) 34   (!) 36  Resp: 14   12  Temp: 98.2 F (36.8 C)     TempSrc: Oral     SpO2: 97%   98%  Weight:  83.9 kg    Height:  5\' 6"  (1.676 m)       Vitals:   08/11/19 2350 08/11/19 2352 08/11/19 2354 08/12/19 0030  BP:   (!) 166/56 (!) 125/40  Pulse: (!) 34   (!) 36  Resp: 14   12  Temp: 98.2 F (36.8 C)     TempSrc: Oral     SpO2: 97%   98%  Weight:  83.9 kg    Height:  5\' 6"  (1.676 m)      Constitutional: Alert and awake,  oriented x3, not in any acute distress. Eyes: PERLA, EOMI, irises appear normal, anicteric sclera,  ENMT: external ears and nose appear normal, normal hearing             Lips appears normal, oropharynx mucosa, tongue, posterior pharynx appear normal  Neck: neck appears normal, no masses, normal ROM, no thyromegaly, no JVD  CVS: S1-S2 clear, no murmur rubs or gallops,  , no carotid bruits, pedal pulses palpable, No LE edema Respiratory:  clear to auscultation bilaterally, no wheezing, rales or rhonchi. Respiratory effort normal. No  accessory muscle use.  Abdomen: soft nontender, nondistended, normal bowel sounds, no hepatosplenomegaly, no hernias Musculoskeletal: : no cyanosis, clubbing , no contractures or atrophy Neuro: Cranial nerves II-XII intact, sensation, reflexes normal, strength Psych: judgement and insight appear normal, stable mood and affect,  Skin: no rashes or lesions or ulcers, no induration or nodules   Labs on Admission: I have personally reviewed following labs and imaging studies  CBC: Recent Labs  Lab 08/12/19 0000  WBC 11.4*  HGB 13.4  HCT 40.4  MCV 87.4  PLT 123456   Basic Metabolic Panel: Recent Labs  Lab 08/12/19 0000  NA 136  K 4.5  CL 99  CO2 29  GLUCOSE 161*  BUN 22  CREATININE 1.13*  CALCIUM 9.0   GFR: Estimated Creatinine Clearance: 44.8 mL/min (A) (by C-G formula based on SCr of 1.13 mg/dL (H)). Liver Function Tests: No results for input(s): AST, ALT, ALKPHOS, BILITOT, PROT, ALBUMIN in the last 168 hours. No results for input(s): LIPASE, AMYLASE in the last 168 hours. No results for input(s): AMMONIA in the last 168 hours. Coagulation Profile: No results for input(s): INR, PROTIME in the last 168 hours. Cardiac Enzymes: No results for input(s): CKTOTAL, CKMB, CKMBINDEX, TROPONINI in the last 168 hours. BNP (last 3 results) No results for input(s): PROBNP in the last 8760 hours. HbA1C: No results for input(s): HGBA1C in the last 72  hours. CBG: No results for input(s): GLUCAP in the last 168 hours. Lipid Profile: No results for input(s): CHOL, HDL, LDLCALC, TRIG, CHOLHDL, LDLDIRECT in the last 72 hours. Thyroid Function Tests: No results for input(s): TSH, T4TOTAL, FREET4, T3FREE, THYROIDAB in the last 72 hours. Anemia Panel: No results for input(s): VITAMINB12, FOLATE, FERRITIN, TIBC, IRON, RETICCTPCT in the last 72 hours. Urine analysis:    Component Value Date/Time   COLORURINE STRAW (A) 10/27/2017 1419   APPEARANCEUR CLEAR (A) 10/27/2017 1419   LABSPEC 1.008 10/27/2017 1419   PHURINE 5.0 10/27/2017 1419   GLUCOSEU NEGATIVE 10/27/2017 1419   HGBUR NEGATIVE 10/27/2017 1419   BILIRUBINUR NEGATIVE 10/27/2017 1419   KETONESUR NEGATIVE 10/27/2017 1419   PROTEINUR NEGATIVE 10/27/2017 1419   UROBILINOGEN 0.2 12/29/2014 0847   NITRITE NEGATIVE 10/27/2017 1419   LEUKOCYTESUR NEGATIVE 10/27/2017 1419    Radiological Exams on Admission: DG Chest 2 View  Result Date: 08/12/2019 CLINICAL DATA:  Chest pain EXAM: CHEST - 2 VIEW COMPARISON:  02/14/2017 FINDINGS: Cardiomegaly. Lungs clear. No effusions or edema. No acute bony abnormality. IMPRESSION: Cardiomegaly.  No active disease. Electronically Signed   By: Rolm Baptise M.D.   On: 08/12/2019 01:08    EKG: Independently reviewed.   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  Continue flecainide and Xarelto for now -Cardiology consult for  additional recommendations     DVT prophylaxis: on xarelto  Code Status: full code  Family Communication:  none  Disposition Plan: Back to previous home environment Consults called: Dr Clayborn Bigness cardiology Status:obs    Athena Masse MD [facility]     08/12/2019, 2:16 AM

## 2019-08-12 NOTE — Progress Notes (Signed)
She feels better today.  He has no complaints.  No shortness of breath, chest pain, fatigue, confusion or dizziness.  Heart rate is improving and is now in the low low 50s.  Rate controlling drugs have been held.  Continue to monitor on telemetry.  Case was discussed with Dr. Clayborn Bigness, cardiologist.

## 2019-08-13 DIAGNOSIS — R001 Bradycardia, unspecified: Secondary | ICD-10-CM | POA: Diagnosis not present

## 2019-08-13 DIAGNOSIS — I10 Essential (primary) hypertension: Secondary | ICD-10-CM | POA: Diagnosis not present

## 2019-08-13 DIAGNOSIS — I480 Paroxysmal atrial fibrillation: Secondary | ICD-10-CM | POA: Diagnosis not present

## 2019-08-13 LAB — GLUCOSE, CAPILLARY
Glucose-Capillary: 97 mg/dL (ref 70–99)
Glucose-Capillary: 117 mg/dL — ABNORMAL HIGH (ref 70–99)
Glucose-Capillary: 153 mg/dL — ABNORMAL HIGH (ref 70–99)

## 2019-08-13 LAB — CREATININE, SERUM
Creatinine, Ser: 0.91 mg/dL (ref 0.44–1.00)
GFR calc Af Amer: >60 mL/min (ref >60)
GFR calc non Af Amer: >60 mL/min (ref >60)

## 2019-08-13 MED ORDER — HYDROCHLOROTHIAZIDE 25 MG PO TABS
25.0000 mg | ORAL_TABLET | Freq: Every day | ORAL | Status: DC
  Administered 2019-08-13: 25 mg via ORAL
  Filled 2019-08-13: qty 1

## 2019-08-13 MED ORDER — METOPROLOL SUCCINATE ER 25 MG PO TB24: 12.5000 mg | ORAL_TABLET | Freq: Every day | ORAL | 0 refills | Status: DC

## 2019-08-13 MED ORDER — ONDANSETRON HCL 4 MG PO TABS: 4.0000 mg | ORAL_TABLET | Freq: Three times a day (TID) | ORAL | 0 refills | Status: AC | PRN

## 2019-08-13 MED ORDER — PANTOPRAZOLE SODIUM 40 MG PO TBEC
40.0000 mg | DELAYED_RELEASE_TABLET | Freq: Two times a day (BID) | ORAL | Status: DC
  Administered 2019-08-13: 10:00:00 40 mg via ORAL
  Filled 2019-08-13: qty 1

## 2019-08-13 MED ORDER — AMLODIPINE BESYLATE 5 MG PO TABS: 5.0000 mg | ORAL_TABLET | Freq: Every day | ORAL | 0 refills | Status: DC

## 2019-08-13 MED ORDER — FLUTICASONE PROPIONATE 50 MCG/ACT NA SUSP
2.0000 | Freq: Every day | NASAL | Status: DC
  Administered 2019-08-13: 2 via NASAL
  Filled 2019-08-13: qty 16

## 2019-08-13 MED ORDER — HYDROXYZINE HCL 25 MG PO TABS: 25.0000 mg | ORAL_TABLET | Freq: Three times a day (TID) | ORAL | Status: DC | PRN

## 2019-08-13 MED ORDER — ALBUTEROL SULFATE (2.5 MG/3ML) 0.083% IN NEBU: 2.5000 mg | INHALATION_SOLUTION | Freq: Four times a day (QID) | RESPIRATORY_TRACT | Status: DC | PRN

## 2019-08-13 MED ORDER — FLECAINIDE ACETATE 50 MG PO TABS: 50.0000 mg | ORAL_TABLET | Freq: Two times a day (BID) | ORAL | 0 refills | Status: AC

## 2019-08-13 MED ORDER — VITAMIN D (ERGOCALCIFEROL) 1.25 MG (50000 UNIT) PO CAPS: 50000.0000 [IU] | ORAL_CAPSULE | ORAL | Status: DC

## 2019-08-13 MED ORDER — FUROSEMIDE 20 MG PO TABS: 20.0000 mg | ORAL_TABLET | Freq: Every day | ORAL | Status: AC | PRN

## 2019-08-13 MED ORDER — HYDROCODONE-HOMATROPINE 5-1.5 MG/5ML PO SYRP: 5.0000 mL | ORAL_SOLUTION | Freq: Four times a day (QID) | ORAL | Status: DC | PRN

## 2019-08-13 MED ORDER — SUMATRIPTAN SUCCINATE 50 MG PO TABS
25.0000 mg | ORAL_TABLET | ORAL | Status: DC | PRN
  Filled 2019-08-13: qty 1

## 2019-08-13 MED ORDER — DIFLUPREDNATE 0.05 % OP EMUL
1.0000 [drp] | Freq: Three times a day (TID) | OPHTHALMIC | Status: DC
  Administered 2019-08-13: 1 [drp] via OPHTHALMIC
  Filled 2019-08-13: qty 5

## 2019-08-13 NOTE — Plan of Care (Signed)
Patient evaluated by cardiology & hospitalist team and appropriate for discharge. Patient and daughter educated on medication changes and to follow-up with cardiology on 3/23. No questions at this time. Understands to notify [facility], MD for rate less than 50bpm prior to her appointment.

## 2019-08-13 NOTE — Discharge Summary (Signed)
Physician Discharge Summary  Mallory Arellano [id number] DOB: 10-22-1940 DOA: 08/12/2019  PCP: Sharyne Peach, MD  Admit date: 08/12/2019 Discharge date: 08/13/2019  Discharge disposition: Home   Recommendations for Outpatient Follow-Up:   Outpatient follow-up with cardiologist, Dr. Clayborn Bigness, on Tuesday, August 15, 2019. 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  Diet recommendation: Low salt and low sugar diet  Code status: Full code.    Hospital Course:   [name] is a 79 y.o. female with medical history significant for paroxysmal A. fib on flecainide, Xarelto and metoprolol, and was recently started on Cardizem.  She presented to the emergency room with palpitations, chest pain, lightheadedness and with reports of heart rate in the 30s and 40s at home.   Heart rate was 34 in the emergency room.  Troponins were only minimally elevated flat.  She was admitted to the hospital for symptomatic bradycardia.  Metoprolol, Cardizem and Tikosyn were discontinued.  She was seen in consultation by the cardiologist and her medications were adjusted.  She is no longer symptomatic.  Her heart rate has improved and she is deemed stable for discharge to home today.  Discharge plan was discussed with patient and her daughter, Lenna Sciara, at the bedside and they verbalized understanding.     Discharge Exam:   Vitals:   08/13/19 1122 08/13/19 1523  BP: (!) 179/70 (!) 152/74  Pulse: 63 (!) 57  Resp:  16  Temp:  97.6 F (36.4 C)  SpO2:  99%   Vitals:   08/13/19 0738 08/13/19 1119 08/13/19 1122 08/13/19 1523  BP: (!) 174/60 (!) 184/75 (!) 179/70 (!) 152/74  Pulse: 66 67 63 (!) 57    Resp: 17 17  16   Temp: 98.3 F (36.8 C) 98.3 F (36.8 C)  97.6 F (36.4 C)  TempSrc: Oral Oral  Oral  SpO2: 99% 99%  99%  Weight:      Height:         GEN: NAD SKIN: No rash EYES: EOMI ENT: MMM CV: RRR PULM: CTA B ABD: soft, ND, NT, +BS CNS: AAO x 3, non focal EXT: No edema or tenderness   The results of significant diagnostics from this hospitalization (including imaging, microbiology, ancillary and laboratory) are listed below for reference.     Procedures and Diagnostic Studies:   DG Chest 2 View  Result Date: 08/12/2019 CLINICAL DATA:  Chest pain EXAM: CHEST - 2 VIEW COMPARISON:  02/14/2017 FINDINGS: Cardiomegaly. Lungs clear. No effusions or edema. No acute bony abnormality. IMPRESSION: Cardiomegaly.  No active disease. Electronically Signed   By: Rolm Baptise M.D.   On: 08/12/2019 01:08     Labs:   Basic Metabolic Panel: Recent Labs  Lab 08/12/19 0000 08/13/19 0518  NA 136  --   K 4.5  --   CL 99  --   CO2 29  --   GLUCOSE 161*  --   BUN 22  --   CREATININE 1.13* 0.91  CALCIUM 9.0  --   MG 2.1  --    GFR Estimated Creatinine Clearance: 55.4 mL/min (by C-G formula based on SCr of 0.91 mg/dL). Liver Function Tests: No results for input(s): AST, ALT, ALKPHOS, BILITOT,  PROT, ALBUMIN in the last 168 hours. No results for input(s): LIPASE, AMYLASE in the last 168 hours. No results for input(s): AMMONIA in the last 168 hours. Coagulation profile No results for input(s): INR, PROTIME in the last 168 hours.  CBC: Recent Labs  Lab 08/12/19 0000  WBC 11.4*  HGB 13.4  HCT 40.4  MCV 87.4  PLT 280   Cardiac Enzymes: No results for input(s): CKTOTAL, CKMB, CKMBINDEX, TROPONINI in the last 168 hours. BNP: Invalid input(s): POCBNP CBG: Recent Labs  Lab 08/12/19 1729 08/12/19 2139 08/13/19 0740 08/13/19 1120 08/13/19 1524  GLUCAP 132* 96 97 117* 153*   D-Dimer No results for input(s): DDIMER in the last 72 hours. Hgb A1c Recent Labs     08/12/19 0000  HGBA1C 8.0*   Lipid Profile No results for input(s): CHOL, HDL, LDLCALC, TRIG, CHOLHDL, LDLDIRECT in the last 72 hours. Thyroid function studies Recent Labs    08/12/19 0000  TSH 1.073   Anemia work up No results for input(s): VITAMINB12, FOLATE, FERRITIN, TIBC, IRON, RETICCTPCT in the last 72 hours. Microbiology Recent Results (from the past 240 hour(s))  SARS CORONAVIRUS 2 (TAT 6-24 HRS) Nasopharyngeal Nasopharyngeal Swab     Status: None   Collection Time: 08/12/19  2:55 AM   Specimen: Nasopharyngeal Swab  Result Value Ref Range Status   SARS Coronavirus 2 NEGATIVE NEGATIVE Final    Comment: (NOTE) SARS-CoV-2 target nucleic acids are NOT DETECTED. The SARS-CoV-2 RNA is generally detectable in upper and lower respiratory specimens during the acute phase of infection. Negative results do not preclude SARS-CoV-2 infection, do not rule out co-infections with other pathogens, and should not be used as the sole basis for treatment or other patient management decisions. Negative results must be combined with clinical observations, patient history, and epidemiological information. The expected result is Negative. Fact Sheet for Patients: SugarRoll.be Fact Sheet for Healthcare Providers: https://www.woods-mathews.com/ This test is not yet approved or cleared by the [place] FDA and  has been authorized for detection and/or diagnosis of SARS-CoV-2 by FDA under an Emergency Use Authorization (EUA). This EUA will remain  in effect (meaning this test can be used) for the duration of the COVID-19 declaration under Section 56 4(b)(1) of the Act, 21 U.S.C. section 360bbb-3(b)(1), unless the authorization is terminated or revoked sooner. Performed at [facility], [place] [address] [place], [place] [address]      Discharge Instructions:   Discharge Instructions    Diet - low sodium heart healthy   Complete by: As  directed    Diet Carb Modified   Complete by: As directed    Increase activity slowly   Complete by: As directed      Allergies as of 08/13/2019      Reactions   Losartan Cough   Sitagliptin Swelling   ABDOMINAL SWELLING   Metformin And Related Other (See Comments)   Stomach swelling   Warfarin Sodium Other (See Comments)   Patient didn't know what happened but states she couldn't take it   Penicillins Itching   Has patient had a PCN reaction causing immediate rash, facial/tongue/throat swelling, SOB or lightheadedness with hypotension: No Has patient had a PCN reaction causing severe rash involving mucus membranes or skin necrosis: No Has patient had a PCN reaction that required hospitalization: No Has patient had a PCN reaction occurring within the last 10 years: No If all of the above answers are "NO", then may proceed with Cephalosporin use.  Medication List    STOP taking these medications   diltiazem 180 MG 24 hr capsule Commonly known as: CARDIZEM CD   HYDROcodone-homatropine 5-1.5 MG/5ML syrup Commonly known as: HYCODAN     TAKE these medications   amLODipine 5 MG tablet Commonly known as: NORVASC Take 1 tablet (5 mg total) by mouth daily. Start taking on: August 14, 2019   Besivance 0.6 % Susp Generic drug: Besifloxacin HCl Place 1 drop into the left eye 3 (three) times daily.   budesonide-formoterol 160-4.5 MCG/ACT inhaler Commonly known as: SYMBICORT Inhale 2 puffs into the lungs 2 (two) times daily.   diclofenac Sodium 1 % Gel Commonly known as: VOLTAREN Apply 2 g topically 2 (two) times daily.   Durezol 0.05 % Emul Generic drug: Difluprednate Place 1 drop into the left eye 3 (three) times daily.   flecainide 50 MG tablet Commonly known as: TAMBOCOR Take 1 tablet (50 mg total) by mouth every 12 (twelve) hours. Start taking on: August 15, 2019 What changed: These instructions start on August 15, 2019. If you are unsure what to do until then, ask  your doctor or other care provider.   fluticasone 50 MCG/ACT nasal spray Commonly known as: FLONASE Place 2 sprays into both nostrils daily.   furosemide 20 MG tablet Commonly known as: LASIX Take 1 tablet (20 mg total) by mouth daily as needed for edema.   glimepiride 4 MG tablet Commonly known as: AMARYL Take 4 mg by mouth daily with breakfast.   hydrochlorothiazide 25 MG tablet Commonly known as: HYDRODIURIL Take 25 mg by mouth daily.   hydrOXYzine 25 MG tablet Commonly known as: ATARAX/VISTARIL Take 25 mg by mouth 3 (three) times daily as needed for itching.   levalbuterol 1.25 MG/3ML nebulizer solution Commonly known as: XOPENEX Take 1.25 mg by nebulization every 6 (six) hours as needed for wheezing.   metoprolol succinate 25 MG 24 hr tablet Commonly known as: TOPROL-XL Take 0.5 tablets (12.5 mg total) by mouth daily. Take with or immediately following a meal. Start taking on: August 15, 2019 What changed:   medication strength  how much to take  These instructions start on August 15, 2019. If you are unsure what to do until then, ask your doctor or other care provider.   milk thistle 175 MG tablet Take 175 mg by mouth daily.   ondansetron 4 MG tablet Commonly known as: Zofran Take 1 tablet (4 mg total) by mouth every 8 (eight) hours as needed for nausea or vomiting.   pantoprazole 40 MG tablet Commonly known as: PROTONIX Take 40 mg by mouth 2 (two) times daily.   Prolensa 0.07 % Soln Generic drug: Bromfenac Sodium Place 1 drop into the left eye at bedtime.   rivaroxaban 20 MG Tabs tablet Commonly known as: XARELTO Take 20 mg by mouth daily with breakfast.   SUMAtriptan 25 MG tablet Commonly known as: IMITREX Take 1 tablet by mouth every 2 (two) hours as needed for migraine.   Vitamin D (Ergocalciferol) 1.25 MG (50000 UNIT) Caps capsule Commonly known as: DRISDOL Take 50,000 Units by mouth every Monday.         Time coordinating discharge: 32  minutes  Signed:  Leitha Hyppolite  [facility] 08/13/2019, 4:36 PM

## 2019-08-13 NOTE — Discharge Instructions (Signed)
Bradycardia, Adult Bradycardia is a slower-than-normal heartbeat. A normal resting heart rate for an adult ranges from 60 to 100 beats per minute. With bradycardia, the resting heart rate is less than 60 beats per minute. Bradycardia can prevent enough oxygen from reaching certain areas of your body when you are active. It can be serious if it keeps enough oxygen from reaching your brain and other parts of your body. Bradycardia is not a problem for everyone. For some healthy adults, a slow resting heart rate is normal. What are the causes? This condition may be caused by:  A problem with the heart, including: ? A problem with the heart's electrical system, such as a heart block. With a heart block, electrical signals between the chambers of the heart are partially or completely blocked, so they are not able to work as they should. ? A problem with the heart's natural pacemaker (sinus node). ? Heart disease. ? A heart attack. ? Heart damage. ? Lyme disease. ? A heart infection. ? A heart condition that is present at birth (congenital heart defect).  Certain medicines that treat heart conditions.  Certain conditions, such as hypothyroidism and obstructive sleep apnea.  Problems with the balance of chemicals and other substances, like potassium, in the blood.  Trauma.  Radiation therapy. What increases the risk? You are more likely to develop this condition if you:  Are age 65 or older.  Have high blood pressure (hypertension), high cholesterol (hyperlipidemia), or diabetes.  Drink heavily, use tobacco or nicotine products, or use drugs. What are the signs or symptoms? Symptoms of this condition include:  Light-headedness.  Feeling faint or fainting.  Fatigue and weakness.  Trouble with activity or exercise.  Shortness of breath.  Chest pain (angina).  Drowsiness.  Confusion.  Dizziness. How is this diagnosed? This condition may be diagnosed based on:  Your  symptoms.  Your medical history.  A physical exam. During the exam, your health care provider will listen to your heartbeat and check your pulse. To confirm the diagnosis, your health care provider may order tests, such as:  Blood tests.  An electrocardiogram (ECG). This test records the heart's electrical activity. The test can show how fast your heart is beating and whether the heartbeat is steady.  A test in which you wear a portable device (event recorder or Holter monitor) to record your heart's electrical activity while you go about your day.  Anexercise test. How is this treated? Treatment for this condition depends on the cause of the condition and how severe your symptoms are. Treatment may involve:  Treatment of the underlying condition.  Changing your medicines or how much medicine you take.  Having a small, battery-operated device called a pacemaker implanted under the skin. When bradycardia occurs, this device can be used to increase your heart rate and help your heart beat in a regular rhythm. Follow these instructions at home: Lifestyle   Manage any health conditions that contribute to bradycardia as told by your health care provider.  Follow a heart-healthy diet. A nutrition specialist (dietitian) can help educate you about healthy food options and changes.  Follow an exercise program that is approved by your health care provider.  Maintain a healthy weight.  Try to reduce or manage your stress, such as with yoga or meditation. If you need help reducing stress, ask your health care provider.  Do not use any products that contain nicotine or tobacco, such as cigarettes, e-cigarettes, and chewing tobacco. If you need help   quitting, ask your health care provider.  Do not use illegal drugs.  Limit alcohol intake to no more than 1 drink a day for nonpregnant women and 2 drinks a day for men. Be aware of how much alcohol is in your drink. In the [place] one drink  equals one 12 oz bottle of beer (355 mL), one 5 oz glass of wine (148 mL), or one 1 oz glass of hard liquor (44 mL). General instructions  Take over-the-counter and prescription medicines only as told by your health care provider.  Keep all follow-up visits as told by your health care provider. This is important. How is this prevented? In some cases, bradycardia may be prevented by:  Treating underlying medical problems.  Stopping behaviors or medicines that can trigger the condition. Contact a health care provider if you:  Feel light-headed or dizzy.  Almost faint.  Feel weak or are easily fatigued during physical activity.  Experience confusion or have memory problems. Get help right away if:  You faint.  You have: ? An irregular heartbeat (palpitations). ? Chest pain. ? Trouble breathing. Summary  Bradycardia is a slower-than-normal heartbeat. With bradycardia, the resting heart rate is less than 60 beats per minute.  Treatment for this condition depends on the cause.  Manage any health conditions that contribute to bradycardia as told by your health care provider.  Do not use any products that contain nicotine or tobacco, such as cigarettes, e-cigarettes, and chewing tobacco, and limit alcohol intake.  Keep all follow-up visits as told by your health care provider. This is important. This information is not intended to replace advice given to you by your health care provider. Make sure you discuss any questions you have with your health care provider. Document Revised: 11/22/2017 Document Reviewed: 10/20/2017 Elsevier Patient Education  2020 [employer]  

## 2019-08-13 NOTE — Progress Notes (Signed)
[facility]    SUBJECTIVE: Patient feels much better heart rates improved to around 60 she initially presented to the ER in the 30s medications still being held but her blood pressure is beginning to elevate.  Patient may need to have increased blood pressure control and place of metoprolol or Cardizem.  Consider losartan or ACE inhibitor consider amlodipine or hydralazine   Vitals:   08/13/19 0404 08/13/19 0738 08/13/19 1119 08/13/19 1122  BP: (!) 161/73 (!) 174/60 (!) 184/75 (!) 179/70  Pulse: 65 66 67 63  Resp: 16 17 17    Temp: 98.2 F (36.8 C) 98.3 F (36.8 C) 98.3 F (36.8 C)   TempSrc:  Oral Oral   SpO2: 100% 99% 99%   Weight: 83.3 kg     Height:         Intake/Output Summary (Last 24 hours) at 08/13/2019 1342 Last data filed at 08/13/2019 0840 Gross per 24 hour  Intake 731.17 ml  Output 1000 ml  Net -268.83 ml      PHYSICAL EXAM  General: Well developed, well nourished, in no acute distress HEENT:  Normocephalic and atramatic Neck:  No JVD.  Lungs: Clear bilaterally to auscultation and percussion. Heart: HRRR . Normal S1 and S2 without gallops or murmurs.  Abdomen: Bowel sounds are positive, abdomen soft and non-tender  Msk:  Back normal, normal gait. Normal strength and tone for age. Extremities: No clubbing, cyanosis or edema.   Neuro: Alert and oriented X 3. Psych:  Good affect, responds appropriately   LABS: Basic Metabolic Panel: Recent Labs    08/12/19 0000 08/13/19 0518  NA 136  --   K 4.5  --   CL 99  --   CO2 29  --   GLUCOSE 161*  --   BUN 22  --   CREATININE 1.13* 0.91  CALCIUM 9.0  --   MG 2.1  --    Liver Function Tests: No results for input(s): AST, ALT, ALKPHOS, BILITOT, PROT, ALBUMIN in the last 72 hours. No results for input(s): LIPASE, AMYLASE in the last 72 hours. CBC: Recent Labs    08/12/19 0000  WBC 11.4*  HGB 13.4  HCT 40.4  MCV 87.4  PLT 280   Cardiac Enzymes: No results for input(s): CKTOTAL, CKMB, CKMBINDEX,  TROPONINI in the last 72 hours. BNP: Invalid input(s): POCBNP D-Dimer: No results for input(s): DDIMER in the last 72 hours. Hemoglobin A1C: Recent Labs    08/12/19 0000  HGBA1C 8.0*   Fasting Lipid Panel: No results for input(s): CHOL, HDL, LDLCALC, TRIG, CHOLHDL, LDLDIRECT in the last 72 hours. Thyroid Function Tests: Recent Labs    08/12/19 0000  TSH 1.073   Anemia Panel: No results for input(s): VITAMINB12, FOLATE, FERRITIN, TIBC, IRON, RETICCTPCT in the last 72 hours.  DG Chest 2 View  Result Date: 08/12/2019 CLINICAL DATA:  Chest pain EXAM: CHEST - 2 VIEW COMPARISON:  02/14/2017 FINDINGS: Cardiomegaly. Lungs clear. No effusions or edema. No acute bony abnormality. IMPRESSION: Cardiomegaly.  No active disease. Electronically Signed   By: Rolm Baptise M.D.   On: 08/12/2019 01:08     Echo none  TELEMETRY: Sinus bradycardia nonspecific ST-T wave changes  ASSESSMENT AND PLAN:  Principal Problem:   Symptomatic sinus bradycardia Active Problems:   Type 2 diabetes mellitus without complication (HCC)   Hypertension, essential, benign   Mild persistent chronic asthma without complication   Paroxysmal atrial fibrillation (HCC) Palpitations Generalized weakness . Plan Agree with telemetry Continue medical therapy Continue  to hold rate blocking drugs flecainide metoprolol Cardizem Hopefully able to discharge home later today Consider restarting flecainide and metoprolol at half the previous dose but continue to discontinue Cardizem Maintain flecainide Xarelto for anticoagulation Agree with diabetes management and control Inhalers as necessary for intermittent asthma Resume flecainide on Tuesday 50 mg twice a day reinstitute metoprolol succinate 12.5 once a day on Tuesday continue to hold and discontinue Cardizem Consider amlodipine for elevated systolic blood pressure at 5 or 10 mg a day Consider low-dose hydralazine 25 mg 3 times a day if needed for blood pressure  control Acceptable surgical risk for eye surgery on Monday mild to moderate risk Follow-up with cardiology 1 to 2 weeks   Yolonda Kida, MD 08/13/2019 1:42 PM

## 2019-08-21 ENCOUNTER — Other Ambulatory Visit: Payer: Self-pay

## 2019-08-21 ENCOUNTER — Encounter: Payer: Self-pay | Admitting: Emergency Medicine

## 2019-08-21 ENCOUNTER — Ambulatory Visit
Admission: EM | Admit: 2019-08-21 | Discharge: 2019-08-21 | Disposition: A | Discharge status: Home / Self Care | Payer: Medicare Other | Attending: Urgent Care | Admitting: Urgent Care

## 2019-08-21 DIAGNOSIS — M722 Plantar fascial fibromatosis: Secondary | ICD-10-CM | POA: Diagnosis not present

## 2019-08-21 DIAGNOSIS — E119 Type 2 diabetes mellitus without complications: Secondary | ICD-10-CM

## 2019-08-21 DIAGNOSIS — M79672 Pain in left foot: Secondary | ICD-10-CM

## 2019-08-21 LAB — POCT FASTING CBG KUC MANUAL ENTRY: POCT Glucose (KUC): 145 mg/dL — AB (ref 70–99)

## 2019-08-21 MED ORDER — PREDNISONE 10 MG PO TABS: 30.0000 mg | ORAL_TABLET | Freq: Every day | ORAL | 0 refills | Status: DC

## 2019-08-21 NOTE — ED Provider Notes (Signed)
Mallory Arellano   MRN: [id number] DOB: 1940/10/25  Subjective:   Mallory Arellano is a 79 y.o. female presenting for 2-day history of acute onset left-sided foot pain and intermittent swelling.  Patient states that she was recently in the hospital and was wearing slippers that did not provide her any kind of support.  She normally wears shoes with good support.  Denies falls, trauma.  Patient is a diabetic, last A1c was 8% and she is doing well controlling her diet.  Denies history of gout.  However, she does admit that she eats fish every day.  She is not a drinker.  No current facility-administered medications for this encounter.  Current Outpatient Medications:  .  amLODipine (NORVASC) 5 MG tablet, Take 1 tablet (5 mg total) by mouth daily., Disp: 30 tablet, Rfl: 0 .  Besifloxacin HCl (BESIVANCE) 0.6 % SUSP, Place 1 drop into the left eye 3 (three) times daily., Disp: , Rfl:  .  Bromfenac Sodium (PROLENSA) 0.07 % SOLN, Place 1 drop into the left eye at bedtime., Disp: , Rfl:  .  budesonide-formoterol (SYMBICORT) 160-4.5 MCG/ACT inhaler, Inhale 2 puffs into the lungs 2 (two) times daily., Disp: , Rfl:  .  diclofenac Sodium (VOLTAREN) 1 % GEL, Apply 2 g topically 2 (two) times daily., Disp: , Rfl:  .  Difluprednate (DUREZOL) 0.05 % EMUL, Place 1 drop into the left eye 3 (three) times daily., Disp: , Rfl:  .  flecainide (TAMBOCOR) 50 MG tablet, Take 1 tablet (50 mg total) by mouth every 12 (twelve) hours., Disp: 60 tablet, Rfl: 0 .  fluticasone (FLONASE) 50 MCG/ACT nasal spray, Place 2 sprays into both nostrils daily., Disp: , Rfl:  .  furosemide (LASIX) 20 MG tablet, Take 1 tablet (20 mg total) by mouth daily as needed for edema., Disp: , Rfl:  .  glimepiride (AMARYL) 4 MG tablet, Take 4 mg by mouth daily with breakfast., Disp: , Rfl:  .  hydrochlorothiazide (HYDRODIURIL) 25 MG tablet, Take 25 mg by mouth daily., Disp: , Rfl:  .  hydrOXYzine (ATARAX/VISTARIL) 25 MG tablet, Take 25 mg  by mouth 3 (three) times daily as needed for itching. , Disp: , Rfl:  .  levalbuterol (XOPENEX) 1.25 MG/3ML nebulizer solution, Take 1.25 mg by nebulization every 6 (six) hours as needed for wheezing., Disp: , Rfl:  .  metoprolol succinate (TOPROL-XL) 25 MG 24 hr tablet, Take 0.5 tablets (12.5 mg total) by mouth daily. Take with or immediately following a meal., Disp: 30 tablet, Rfl: 0 .  milk thistle 175 MG tablet, Take 175 mg by mouth daily., Disp: , Rfl:  .  ondansetron (ZOFRAN) 4 MG tablet, Take 1 tablet (4 mg total) by mouth every 8 (eight) hours as needed for nausea or vomiting., Disp: 15 tablet, Rfl: 0 .  pantoprazole (PROTONIX) 40 MG tablet, Take 40 mg by mouth 2 (two) times daily. , Disp: , Rfl: 11 .  rivaroxaban (XARELTO) 20 MG TABS tablet, Take 20 mg by mouth daily with breakfast., Disp: , Rfl:  .  SUMAtriptan (IMITREX) 25 MG tablet, Take 1 tablet by mouth every 2 (two) hours as needed for migraine. , Disp: , Rfl:  .  Vitamin D, Ergocalciferol, (DRISDOL) 1.25 MG (50000 UNIT) CAPS capsule, Take 50,000 Units by mouth every Monday., Disp: , Rfl:    Allergies  Allergen Reactions  . Losartan Cough  . Sitagliptin Swelling    ABDOMINAL SWELLING  . Metformin And Related Other (See Comments)  Stomach swelling  . Warfarin Sodium Other (See Comments)    Patient didn't know what happened but states she couldn't take it  . Penicillins Itching    Has patient had a PCN reaction causing immediate rash, facial/tongue/throat swelling, SOB or lightheadedness with hypotension: No Has patient had a PCN reaction causing severe rash involving mucus membranes or skin necrosis: No Has patient had a PCN reaction that required hospitalization: No Has patient had a PCN reaction occurring within the last 10 years: No If all of the above answers are "NO", then may proceed with Cephalosporin use.     Past Medical History:  Diagnosis Date  . A-fib ([place])   . Arthritis   . Asthma   . Diabetes mellitus  without complication ([place])   . Dysrhythmia   . GERD (gastroesophageal reflux disease)   . Hypercholesteremia   . Hypertension   . Lower extremity edema   . Palpitation   . UTI (urinary tract infection)      Past Surgical History:  Procedure Laterality Date  . 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 WISDOM TOOTH EXTRACTION      Family History  Problem Relation Age of Onset  . Diabetes Mother   . Hypertension Mother   . Hypertension Brother   . Diabetes Brother     Social History   Tobacco Use  . Smoking status: Never Smoker  . Smokeless tobacco: Never Used  Substance Use Topics  . Alcohol use: No    Alcohol/week: 0.0 standard drinks    Comment: occ  . Drug use: No    ROS   Objective:   Vitals: BP (!) 162/70 (BP Location: Left Arm)   Pulse (!) 56   Temp 98.8 F (37.1 C) (Oral)   Resp 18   Ht 5\' 6"  (1.676 m)   Wt 177 lb (80.3 kg)   SpO2 98%   BMI 28.57 kg/m   Physical Exam Constitutional:      General: She is not in acute distress.    Appearance: Normal appearance. She is well-developed. She is not ill-appearing, toxic-appearing or diaphoretic.  HENT:     Head: Normocephalic and atraumatic.     Nose: Nose normal.     Mouth/Throat:     Mouth: Mucous membranes are moist.     Pharynx: Oropharynx is clear.  Eyes:     General: No scleral icterus.    Extraocular Movements: Extraocular movements intact.     Pupils: Pupils are equal, round, and reactive to light.  Cardiovascular:     Rate and Rhythm: Normal rate and regular rhythm.     Pulses: Normal pulses.     Heart sounds: Normal heart sounds. No murmur. No friction rub. No gallop.   Pulmonary:     Effort: Pulmonary effort is normal. No respiratory distress.     Breath sounds: Normal breath sounds. No stridor. No wheezing, rhonchi or rales.   Musculoskeletal:       Feet:     Comments: Trace edema, no erythema or warmth.  Skin:    General: Skin is warm and dry.     Findings: No rash.  Neurological:     General: No focal deficit present.     Mental Status: She is alert and oriented to person, place, and time.  Psychiatric:        Mood and Affect:  Mood normal.        Behavior: Behavior normal.        Thought Content: Thought content normal.        Judgment: Judgment normal.      Assessment and Plan :   1. Plantar fascial fibromatosis of left foot   2. Left foot pain   3. Well controlled diabetes mellitus ([place])     Counseled patient on differential which includes inflammatory process related to her recent use of slippers, plantar fascial fibromatosis or possible developing gout given her dietary choices.  Either way, patient has not responded to Tylenol.  I prefer to avoid opioids and will have patient use prednisone at 30 mg for 7 days.  Counseled on strict diabetic diet.  Patient is agreeable with this.  Given lack of trauma will defer imaging.  Counseled patient on potential for adverse effects with medications prescribed/recommended today, ER and return-to-clinic precautions discussed, patient verbalized understanding.    Jaynee Eagles, PA-C 08/21/19 1555

## 2019-08-21 NOTE — ED Triage Notes (Signed)
Patient in today c/o left foot pain x 2 days. Patient states the pain is in the joint of her big toe and goes under her foot. Patient states she has been wearing some flat house slippers, but other than that no injury noted.

## 2019-08-21 NOTE — Discharge Instructions (Signed)
Given your current symptoms, I would prefer that you cut back on the amount of fish that you are eating.  Preferably you will need it every day and you can certainly substitute in chicken breast or [place] breast every other day.  We are going to use a steroid course to help you with the pain in your left foot.  I suspect this pain is inflammatory related to using shoes recently that did not give you adequate support, potentially gout given the amount of fish that you are eating.  In either case changing the diet up a bit, using shoes with good support and undergoing a steroid course can help.  You can try propping up your foot and using icing 20 minutes every 2 hours as needed.  Come back to the clinic if you develop a rash, redness, worsening pain or swelling.  Given her discussion about healthy diabetic diet, find my general recommendations below.   For diabetes or elevated blood sugar, please make sure you are avoiding starchy, carbohydrate foods like pasta, breads, pastry, rice, potatoes, desserts. These foods can elevated your blood sugar. Also, avoid sodas, sweet teas, sugary beverages, fruit juices.  Drinking plain water will be much more helpful, try 64 ounces of water daily.  It is okay to flavor your water naturally by cutting cucumber or lemon and mint or lime, placing it in a picture with water and drinking it over a period of 2 to 3 days as long as it remains refrigerated.    For elevated blood pressure, make sure you are monitoring salt in your diet.  Do not eat restaurant foods and limit processed foods at home, prepare/cook your own foods at home.  Processed foods include things like frozen meals preseasoned meats and dinners, deli meats, canned foods as they are high in sodium/salt.  Make sure your pain attention to sodium labels on foods you by at the grocery store.  For seasoning you can use a brand called Mrs. Dash which includes a lot of salt free seasonings.  Salads - kale, spinach,  cabbage, spring mix; use seeds like pumpkin seeds or sunflower seeds, almonds, walnuts or pecans; you can also use 1-2 hard boiled eggs in your salads Fruits - avocadoes, berries (blueberries, raspberries, blackberries), apples, oranges Vegetables - aspargus, cauliflower, broccoli, green beans, brussel spouts, bell peppers; stay away from starchy vegetables like potatoes, carrots, peas  Regarding meat it is better to eat lean meats and limit your red meat consumption including pork.  Wild caught fish, chicken breast are good options.  DO NOT EAT ANY FOODS ON THIS LIST THAT YOU ARE ALLERGIC TO.

## 2019-10-06 ENCOUNTER — Emergency Department
Admission: EM | Admit: 2019-10-06 | Discharge: 2019-10-07 | Disposition: A | Discharge status: Home / Self Care | Payer: Medicare Other | Attending: Emergency Medicine | Admitting: Emergency Medicine

## 2019-10-06 ENCOUNTER — Other Ambulatory Visit: Payer: Self-pay

## 2019-10-06 ENCOUNTER — Encounter: Payer: Self-pay | Admitting: Emergency Medicine

## 2019-10-06 DIAGNOSIS — H43393 Other vitreous opacities, bilateral: Secondary | ICD-10-CM | POA: Diagnosis not present

## 2019-10-06 DIAGNOSIS — Z7984 Long term (current) use of oral hypoglycemic drugs: Secondary | ICD-10-CM | POA: Diagnosis not present

## 2019-10-06 DIAGNOSIS — E119 Type 2 diabetes mellitus without complications: Secondary | ICD-10-CM | POA: Diagnosis not present

## 2019-10-06 DIAGNOSIS — Z79899 Other long term (current) drug therapy: Secondary | ICD-10-CM | POA: Insufficient documentation

## 2019-10-06 DIAGNOSIS — J45909 Unspecified asthma, uncomplicated: Secondary | ICD-10-CM | POA: Insufficient documentation

## 2019-10-06 DIAGNOSIS — R519 Headache, unspecified: Secondary | ICD-10-CM | POA: Diagnosis not present

## 2019-10-06 DIAGNOSIS — Z96641 Presence of right artificial hip joint: Secondary | ICD-10-CM | POA: Insufficient documentation

## 2019-10-06 DIAGNOSIS — I10 Essential (primary) hypertension: Secondary | ICD-10-CM | POA: Diagnosis not present

## 2019-10-06 DIAGNOSIS — H5789 Other specified disorders of eye and adnexa: Secondary | ICD-10-CM | POA: Diagnosis present

## 2019-10-06 DIAGNOSIS — Z9889 Other specified postprocedural states: Secondary | ICD-10-CM | POA: Insufficient documentation

## 2019-10-06 MED ORDER — TETRACAINE HCL 0.5 % OP SOLN
2.0000 [drp] | Freq: Once | OPHTHALMIC | Status: DC
  Filled 2019-10-06: qty 4

## 2019-10-06 NOTE — ED Provider Notes (Addendum)
[facility] Emergency Department Provider Note  ____________________________________________   First MD Initiated Contact with Patient 10/06/19 2306     (approximate)  I have reviewed the triage vital signs and the nursing notes.   HISTORY  Chief Complaint Eye Problem    HPI Mallory Arellano is a 79 y.o. female with past medical history of A. fib, hypertension, hyperlipidemia, here with vision changes.  The patient states that she just underwent cataract surgery 3 weeks ago.  She has had bilateral surgeries.  This was done in [place].  She states that over the last day, she has developed intermittent bright and dark floaters in her bilateral eyes.  She has had associated mild dryness sensation in her bilateral eyes.  No actual visual acuity changes.  The symptoms seem to come and go.  Denies any headache.  No other focal neurological deficits with it.  No other complaints.  She called her optometrist who told her to come to the office, and she was sent here after the office was closing.  No eye trauma.        Past Medical History:  Diagnosis Date  . A-fib ([place])   . Arthritis   . Asthma   . Diabetes mellitus without complication ([place])   . Dysrhythmia   . GERD (gastroesophageal reflux disease)   . Hypercholesteremia   . Hypertension   . Lower extremity edema   . Palpitation   . UTI (urinary tract infection)     Patient Active Problem List   Diagnosis Date Noted  . Primary localized osteoarthritis of right hip 11/11/2017  . Symptomatic sinus bradycardia 02/12/2017  . Chronic cough 06/18/2015  . Plantar fascial fibromatosis 06/18/2015  . Preventative health care 01/18/2015  . Paroxysmal atrial fibrillation ([place]) 12/30/2014  . Type 2 diabetes mellitus without complication ([place]) 0000000  . Hypertension, essential, benign 10/16/2014  . Mild persistent chronic asthma without complication 0000000  . Allergic rhinitis 10/16/2014  . GERD  (gastroesophageal reflux disease) 10/16/2014  . HLD (hyperlipidemia)   . Arthritis of knee, degenerative 09/07/2014    Past Surgical History:  Procedure Laterality Date  . CATARACT EXTRACTION, BILATERAL    . 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 WISDOM TOOTH EXTRACTION      Prior to Admission medications   Medication Sig Start Date End Date Taking? Authorizing Provider  amLODipine (NORVASC) 5 MG tablet Take 1 tablet (5 mg total) by mouth daily. 08/14/19   Jennye Boroughs, MD  Besifloxacin HCl (BESIVANCE) 0.6 % SUSP Place 1 drop into the left eye 3 (three) times daily.    [provider]  Bromfenac Sodium (PROLENSA) 0.07 % SOLN Place 1 drop into the left eye at bedtime.    [provider]  budesonide-formoterol (SYMBICORT) 160-4.5 MCG/ACT inhaler Inhale 2 puffs into the lungs 2 (two) times daily.    [provider]  diclofenac Sodium (VOLTAREN) 1 % GEL Apply 2 g topically 2 (two) times daily.    [provider]  Difluprednate (DUREZOL) 0.05 % EMUL Place 1 drop into the left eye 3 (three) times daily.    [provider]  flecainide (TAMBOCOR) 50 MG tablet Take 1 tablet (50 mg total) by mouth every 12 (twelve) hours. 08/15/19   Jennye Boroughs, MD  fluticasone (FLONASE) 50 MCG/ACT nasal spray Place 2 sprays into both nostrils daily.  [provider]  furosemide (LASIX) 20 MG tablet Take 1 tablet (20 mg total) by mouth daily as needed for edema. 08/13/19   Jennye Boroughs, MD  glimepiride (AMARYL) 4 MG tablet Take 4 mg by mouth daily with breakfast.    [provider]  hydrochlorothiazide (HYDRODIURIL) 25 MG tablet Take 25 mg by mouth daily.    [provider]  hydrOXYzine (ATARAX/VISTARIL) 25 MG tablet Take 25 mg by mouth 3 (three) times daily as needed for itching.      [provider]  levalbuterol Penne Lash) 1.25 MG/3ML nebulizer solution Take 1.25 mg by nebulization every 6 (six) hours as needed for wheezing.    [provider]  metoprolol succinate (TOPROL-XL) 25 MG 24 hr tablet Take 0.5 tablets (12.5 mg total) by mouth daily. Take with or immediately following a meal. 08/15/19   Jennye Boroughs, MD  milk thistle 175 MG tablet Take 175 mg by mouth daily.    [provider]  ondansetron (ZOFRAN) 4 MG tablet Take 1 tablet (4 mg total) by mouth every 8 (eight) hours as needed for nausea or vomiting. 08/13/19   Jennye Boroughs, MD  pantoprazole (PROTONIX) 40 MG tablet Take 40 mg by mouth 2 (two) times daily.  09/05/14   [provider]  predniSONE (DELTASONE) 10 MG tablet Take 3 tablets (30 mg total) by mouth daily with breakfast. 08/21/19   Jaynee Eagles, PA-C  rivaroxaban (XARELTO) 20 MG TABS tablet Take 20 mg by mouth daily with breakfast.    [provider]  SUMAtriptan (IMITREX) 25 MG tablet Take 1 tablet by mouth every 2 (two) hours as needed for migraine.     [provider]  Vitamin D, Ergocalciferol, (DRISDOL) 1.25 MG (50000 UNIT) CAPS capsule Take 50,000 Units by mouth every Monday.    [provider]    Allergies Atorvastatin, Losartan, Sitagliptin, Metformin and related, Warfarin sodium, and Penicillins  Family History  Problem Relation Age of Onset  . Diabetes Mother   . Hypertension Mother   . Hypertension Brother   . Diabetes Brother   . Other Father        "natural causes"    Social History Social History   Tobacco Use  . Smoking status: Never Smoker  . Smokeless tobacco: Never Used  Substance Use Topics  . Alcohol use: Not Currently    Alcohol/week: 0.0 standard drinks  . Drug use: No    Review of Systems  Review of Systems  Constitutional: Negative for fatigue and fever.  HENT: Negative for congestion and sore throat.   Eyes: Positive for pain and visual disturbance.    Respiratory: Negative for cough and shortness of breath.   Cardiovascular: Negative for chest pain.  Gastrointestinal: Negative for abdominal pain, diarrhea, nausea and vomiting.  Genitourinary: Negative for flank pain.  Musculoskeletal: Negative for back pain and neck pain.  Skin: Negative for rash and wound.  Neurological: Negative for weakness.  All other systems reviewed and are negative.    ____________________________________________  PHYSICAL EXAM:      VITAL SIGNS: ED Triage Vitals  Enc Vitals Group     BP 10/06/19 2058 (!) 169/60     Pulse Rate 10/06/19 2058 (!) 59     Resp 10/06/19 2058 18     Temp 10/06/19 2058 98 F (36.7 C)     Temp Source 10/06/19 2058 Oral     SpO2 10/06/19 2058 100 %     Weight 10/06/19 2059 185 lb (83.9 kg)  Height 10/06/19 2059 5\' 6"  (1.676 m)     Head Circumference --      Peak Flow --      Pain Score 10/06/19 2058 9     Pain Loc --      Pain Edu? --      Excl. in [place]? --      Physical Exam Vitals and nursing note reviewed.  Constitutional:      General: She is not in acute distress.    Appearance: She is well-developed.  HENT:     Head: Normocephalic and atraumatic.  Eyes:     Conjunctiva/sclera: Conjunctivae normal.     Comments: Pupils are equal and symmetric bilaterally.  On endoscopy, optic discs appear normal.  There is no overt evidence of retinal detachment.  Visual fields are intact.  Extraocular movements are fully intact.  On slit-lamp exam, there is possible mild flare in the left anterior chamber, otherwise quiet bilaterally.  No significant cells.  No iritis.  There is a small area of possible subconjunctival irritation versus hemorrhage in the left lower eye.  Cardiovascular:     Rate and Rhythm: Normal rate and regular rhythm.     Heart sounds: Normal heart sounds.  Pulmonary:     Effort: Pulmonary effort is normal. No respiratory distress.     Breath sounds: No wheezing.  Abdominal:     General: There is no  distension.  Musculoskeletal:     Cervical back: Neck supple.  Skin:    General: Skin is warm.     Capillary Refill: Capillary refill takes less than 2 seconds.     Findings: No rash.  Neurological:     Mental Status: She is alert and oriented to person, place, and time.     Motor: No abnormal muscle tone.       ____________________________________________   LABS (all labs ordered are listed, but only abnormal results are displayed)  Labs Reviewed - No data to display  ____________________________________________  EKG:  ________________________________________  RADIOLOGY All imaging, including plain films, CT scans, and ultrasounds, independently reviewed by me, and interpretations confirmed via formal radiology reads.  ED MD interpretation:   CT head: [place]  Official radiology report(s): CT Head Wo Contrast  Result Date: 10/07/2019 CLINICAL DATA:  Bilateral eye pain EXAM: CT HEAD WITHOUT CONTRAST TECHNIQUE: Contiguous axial images were obtained from the base of the skull through the vertex without intravenous contrast. COMPARISON:  CT 10/20/2016 FINDINGS: Brain: No acute territorial infarction, hemorrhage or intracranial mass. Mild hypodensity in the white matter consistent with chronic small vessel ischemic change. Stable ventricle size. Vascular: No hyperdense vessels.  Carotid vascular calcification Skull: Normal. Negative for fracture or focal lesion. Sinuses/Orbits: No acute finding. Other: None IMPRESSION: 1. No CT evidence for acute intracranial abnormality. 2. Mild chronic small vessel ischemic change of the white matter Electronically Signed   By: Donavan Foil M.D.   On: 10/07/2019 01:02    ____________________________________________  PROCEDURES   Procedure(s) performed (including Critical Care):  Procedures  ____________________________________________  INITIAL IMPRESSION / MDM / [place] / ED COURSE  As part of my medical decision making, I  reviewed the following data within the [place] notes reviewed and incorporated, Old chart reviewed, Notes from prior ED visits, and  Controlled Substance Database       *Mallory Arellano was evaluated in Emergency Department on 10/07/2019 for the symptoms described in the history of present illness. She was evaluated in  the context of the global COVID-19 pandemic, which necessitated consideration that the patient might be at risk for infection with the SARS-CoV-2 virus that causes COVID-19. Institutional protocols and algorithms that pertain to the evaluation of patients at risk for COVID-19 are in a state of rapid change based on information released by regulatory bodies including the CDC and federal and state organizations. These policies and algorithms were followed during the patient's care in the ED.  Some ED evaluations and interventions may be delayed as a result of limited staffing during the pandemic.*     Medical Decision Making:  79 yo F here with bilateral floaters s/p recent cataract surgery. History is c/w likely mild PVD 2/2 her surgery. On exam, no evidence of iritis/uveitis. Fundoscopic exam shows no apparent RD, CRVO/CRAO, or other abnormalities. Exam is not c/w gluaucoma or acute increase in IOP. Her sx correlate with recent surgery and no signs of CVA on CT Head. Discussed with Dr. Wallace Going, who feels pt can be safely followed up in clinic this AM. Return precautions given. Visual acuity at baseline.  ____________________________________________  FINAL CLINICAL IMPRESSION(S) / ED DIAGNOSES  Final diagnoses:  Vitreous floaters of both eyes     MEDICATIONS GIVEN DURING THIS VISIT:  Medications  tetracaine (PONTOCAINE) 0.5 % ophthalmic solution 2 drop (has no administration in time range)     ED Discharge Orders    None       Note:  This document was prepared using Dragon voice recognition software and may include unintentional dictation  errors.   Duffy Bruce, MD 10/07/19 0200    Duffy Bruce, MD 10/07/19 0200

## 2019-10-06 NOTE — ED Triage Notes (Signed)
Patient states that she had cataract surgery about three weeks ago. Patient states that Thursday night she started having floaters and pain in bilateral eyes. Patient states that she called her surgeon and was told to come here.

## 2019-10-07 ENCOUNTER — Emergency Department: Payer: Medicare Other

## 2019-10-07 NOTE — ED Notes (Signed)
Patient to CT scan

## 2019-10-07 NOTE — ED Notes (Addendum)
Patient taken to room 40 for eye exam with Dr. Dene Gentry.

## 2019-10-07 NOTE — Discharge Instructions (Addendum)
I spoke with Dr. Wallace Going about your eyes today.  He recommends first calling your surgeon in [place] tomorrow morning.  If unable to get in touch with him, you should call the [place] center and set up follow-up urgently tomorrow for repeat evaluation. Notify them of your floaters and that you were seen in the ER, and Dr. Wallace Going was on call.

## 2019-10-07 NOTE — ED Notes (Signed)
Ed provider at the bedside

## 2019-10-07 NOTE — ED Notes (Signed)
ED Provider at bedside. 

## 2019-10-07 NOTE — ED Notes (Signed)
Right eye 20/30 Left eye 20/25 Bilateral eye 20/25

## 2019-10-07 NOTE — ED Notes (Signed)
Patient given a [place] sandwich tray and warm blankets at this time.

## 2020-03-28 ENCOUNTER — Encounter: Payer: Medicare Other | Attending: Family Medicine | Admitting: Dietician

## 2020-03-28 ENCOUNTER — Encounter: Payer: Self-pay | Admitting: Dietician

## 2020-03-28 ENCOUNTER — Other Ambulatory Visit: Payer: Self-pay

## 2020-03-28 VITALS — Ht 66.0 in | Wt 194.1 lb

## 2020-03-28 DIAGNOSIS — E1142 Type 2 diabetes mellitus with diabetic polyneuropathy: Secondary | ICD-10-CM | POA: Diagnosis not present

## 2020-03-28 DIAGNOSIS — E1169 Type 2 diabetes mellitus with other specified complication: Secondary | ICD-10-CM

## 2020-03-28 NOTE — Patient Instructions (Addendum)
   Portion control carbs  2-3 servings at meals   1-2 servings at snacks   For snacks, make sure to pair carb + protein   Keep up the great work!

## 2020-03-28 NOTE — Progress Notes (Signed)
Medical Nutrition Therapy: Visit start time: 6606  end time: 1630  Assessment:  Diagnosis: type 2 diabetes  Past medical history: HTN Psychosocial issues/ stress concerns: pt rates stress level as "moderate" and feels "ok" about stress management skills   Preferred learning method:   Auditory  Visual  Hands-on  Current weight: 194.1 lbs  Height: 5'6" Medications, supplements: reconciled in medical record  Labs HgA1c 8.0(H) on 08/12/2019  Progress and evaluation:   Pt lives by herself, does all of her own cooking, planning, and grocery shopping   Pt shared husband passed away about 5 years ago, now lives alone   Pt reports she eats fruits and vegetables everyday and tries to limit bread intake  Pt states one of her goals is to learn how to better manage her blood sugars  Pt reports she enjoys being active and involved in the community  Physical activity: 60 min PT, 2x/week; walking 6 days/week    Dietary Intake:  Usual eating pattern includes 3 meals and 1-2 snacks per day. Dining out frequency: 1 meals per week.  Breakfast: oatmeal with blueberries or raisin; eggs; 1/2 [employer] toast  Snack: fruit (blueberries, apples) Lunch: [place]- shrimp and peppers and onions with bean, rice, and guac salad  Supper: roasted chicken with stir fry veggies sometimes sweet potatoes/potatoes  Snack: apple Beverages: water, sometimes coffee   Nutrition Care Education: Basic nutrition: basic food groups, appropriate nutrient balance, appropriate meal and snack schedule, general nutrition guidelines    Weight control: importance of low sugar and low fat choices, portion control strategies  Advanced nutrition: cooking techniques, food label reading Diabetes:  goals for BGs, appropriate meal and snack schedule, appropriate carb intake and balance, healthy carb choices, role of fiber, protein, fat Heart health:  importance of controlling BP, identifying high sodium foods, healthy and unhealthy fats,  role of fiber, plant sterols, role of exercise  Nutritional Diagnosis:  [place]-2.2 Altered nutrition-related laboratory As related to type 2 diabetes .  As evidenced by HgA1c of 8.0 on 08/12/2019.  Intervention:  Discussion and instruction as noted above.  Pt following many of the recommendations for managing diabetes.  Discussed portion control, stress management, and physical activity for management of diabetes.  Recommend pt have HgA1c tested soon.   Established goals for additional change.    Maintain physical activity   150 minutes per week is recommendation    Increase fiber intake   Eat at least 3 servings of whole grains a day  Increase fruit and vegetable intake  Increase fluid intake   Drink at least 64oz water daily   Decrease sodium intake   Reduce sodium intake to recommended 1500mg /day   Read nutrition labels on packaged foods to monitor sodium intake   400-600 mg/meal  <200 mg/snack   Decrease fast food frequency   Avoid processed meats    Decrease saturated fat intake   Try more plant-based sources of protein  Limit processed meats   Switch to low fat dairy products   Education Materials given:   General diet guidelines for Diabetes  Food record with meal ideas  Plate Planner with food lists   Goals/ instructions  Learner/ who was taught:   Patient   Level of understanding:  Verbalizes/ demonstrates competency  Demonstrated degree of understanding via:   Teach back Learning barriers:  None  Willingness to learn/ readiness for change:  Eager, change in progress  Monitoring and Evaluation:  Dietary intake, exercise, BGs and A1c, and body weight  follow up: December 15 at 10:30a

## 2020-05-08 ENCOUNTER — Ambulatory Visit: Payer: Medicare Other | Admitting: Dietician

## 2020-05-08 ENCOUNTER — Encounter: Payer: Self-pay | Admitting: Dietician

## 2020-05-08 NOTE — Progress Notes (Signed)
Pt did not show or call to cancel follow up appt this morning at 10:30am.  Left message for pt instructing her to call our office if she is interested in rescheduling appt.

## 2020-05-10 ENCOUNTER — Telehealth: Payer: Self-pay | Admitting: Dietician

## 2020-05-10 NOTE — Telephone Encounter (Signed)
Spoke with pt to reschedule missed visit on 12/15.  Would like to reschedule for 1/7 at 10am.

## 2020-05-31 ENCOUNTER — Ambulatory Visit: Payer: Medicare Other | Admitting: Dietician

## 2020-06-19 ENCOUNTER — Encounter: Payer: Self-pay | Admitting: Dietician

## 2020-06-19 NOTE — Progress Notes (Signed)
Have not heard back from patient after her second missed appointment on 05/31/20. Sent notification to referring provider.

## 2020-08-21 ENCOUNTER — Other Ambulatory Visit
Admission: RE | Admit: 2020-08-21 | Discharge: 2020-08-21 | Disposition: A | Discharge status: Home / Self Care | Payer: Medicare Other | Source: Ambulatory Visit | Attending: Specialist | Admitting: Specialist

## 2020-08-21 DIAGNOSIS — R0789 Other chest pain: Secondary | ICD-10-CM | POA: Insufficient documentation

## 2020-08-21 LAB — D-DIMER, QUANTITATIVE: D-Dimer, Quant: 0.32 ug/mL-FEU (ref 0.00–0.50)

## 2020-08-23 ENCOUNTER — Other Ambulatory Visit: Payer: Self-pay | Admitting: Specialist

## 2020-08-23 DIAGNOSIS — R0789 Other chest pain: Secondary | ICD-10-CM

## 2020-08-23 DIAGNOSIS — R053 Chronic cough: Secondary | ICD-10-CM

## 2020-08-30 ENCOUNTER — Ambulatory Visit
Admission: RE | Admit: 2020-08-30 | Discharge: 2020-08-30 | Disposition: A | Discharge status: Home / Self Care | Payer: Medicare Other | Source: Ambulatory Visit | Attending: Specialist | Admitting: Specialist

## 2020-08-30 ENCOUNTER — Other Ambulatory Visit: Payer: Self-pay

## 2020-08-30 DIAGNOSIS — R0789 Other chest pain: Secondary | ICD-10-CM | POA: Diagnosis present

## 2020-08-30 DIAGNOSIS — R053 Chronic cough: Secondary | ICD-10-CM | POA: Insufficient documentation

## 2020-09-04 ENCOUNTER — Ambulatory Visit: Admission: RE | Admit: 2020-09-04 | Payer: Medicare Other | Source: Ambulatory Visit

## 2020-09-07 NOTE — Progress Notes (Deleted)
New Outpatient Visit Date: 09/09/2020  Referring Provider: Yolonda Kida, MD [place],  [place] [id number]  Chief Complaint: ***  HPI:  Mallory Arellano is a 80 y.o. female who is being seen today for the evaluation of fatigue and shortness of breath at the request of Dr. Towanda Malkin. She has a history of hypertension, hyperlipidemia, type 2 diabetes mellitus, paroxysmal atrial fibrillation, asthma, and GERD.  She has followed with Dr. Clayborn Bigness, having been seen most recently last week by Reuben Likes, NP, and Dr. Etta Quill office.  She continued to complain about fatigue and stating that "something is wrong" and "I still do not feel right."  Echocardiogram earlier this year showed normal LVEF with mild LVH and mild MR.  Myocardial perfusion stress test was low risk with without evidence of ischemia or scar.  She was continued on rivaroxaban, metoprolol succinate, and flecainide for management of her paroxysmal atrial fibrillation  --------------------------------------------------------------------------------------------------  Cardiovascular History & Procedures: Cardiovascular Problems:  Paroxysmal atrial fibrillation  Risk Factors:  Hypertension, hyperlipidemia, diabetes mellitus, and obesity  Cath/PCI:  None  CV Surgery:  None  EP Procedures and Devices:  None  Non-Invasive Evaluation(s):  TTE (08/28/2020, [facility]): Normal LV size with mild LVH.  LVEF greater than 55%.  Mild mitral and trivial tricuspid regurgitation.  Pharmacologic MPI (08/06/2020, [facility]): Low risk study without evidence of ischemia or scar.  LVEF 50%.  Pharmacologic MPI (09/13/2019, [facility]): Low risk study without ischemia or scar.  LVEF 57%.  TTE (09/11/2019, [facility]): Normal LV size with moderate LVH.  LVEF greater than 55% with normal wall motion and grade 1 diastolic dysfunction.  Normal RV size and function.  Mitral annular calcification noted with  mild left atrial enlargement.  Mild mitral and tricuspid regurgitation.  Recent CV Pertinent Labs: Lab Results  Component Value Date   CHOL 127 01/18/2015   HDL 42.20 01/18/2015   LDLCALC 60 01/18/2015   TRIG 123.0 01/18/2015   CHOLHDL 3 01/18/2015   INR 0.90 11/11/2017   K 4.5 08/12/2019   MG 2.1 08/12/2019   BUN 22 08/12/2019   CREATININE 0.91 08/13/2019    --------------------------------------------------------------------------------------------------  Past Medical History:  Diagnosis Date  . A-fib ([place])   . Arthritis   . Asthma   . Diabetes mellitus without complication ([place])   . Dysrhythmia   . GERD (gastroesophageal reflux disease)   . Hypercholesteremia   . Hypertension   . Lower extremity edema   . Palpitation   . UTI (urinary tract infection)     Past Surgical History:  Procedure Laterality Date  . CATARACT EXTRACTION, BILATERAL    . 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 WISDOM TOOTH EXTRACTION      No outpatient medications have been marked as taking for the 09/09/20 encounter (Appointment) with Kaity Pitstick, Mallory Gave, MD.    Allergies: Atorvastatin, Losartan, Sitagliptin, Metformin and related, Warfarin sodium, and Penicillins  Social History   Tobacco Use  . Smoking status: Never Smoker  . Smokeless tobacco: Never Used  Vaping Use  . Vaping Use: Never used  Substance Use Topics  . Alcohol use: Not Currently    Alcohol/week: 0.0 standard drinks  . Drug use: No    Family History  Problem Relation Age of Onset  . Diabetes Mother   . Hypertension Mother   . Hypertension Brother   .  Diabetes Brother   . Other Father        "natural causes"    Review of Systems: A 12-system review of systems was performed and was negative except as noted in the  HPI.  --------------------------------------------------------------------------------------------------  Physical Exam: There were no vitals taken for this visit.  General:  *** HEENT: No conjunctival pallor or scleral icterus. Facemask in place. Neck: Supple without lymphadenopathy, thyromegaly, JVD, or HJR. No carotid bruit. Lungs: Normal work of breathing. Clear to auscultation bilaterally without wheezes or crackles. Heart: Regular rate and rhythm without murmurs, rubs, or gallops. Non-displaced PMI. Abd: Bowel sounds present. Soft, NT/ND without hepatosplenomegaly Ext: No lower extremity edema. Radial, PT, and DP pulses are 2+ bilaterally Skin: Warm and dry without rash. Neuro: CNIII-XII intact. Strength and fine-touch sensation intact in upper and lower extremities bilaterally. Psych: Normal mood and affect.  EKG:  ***  Lab Results  Component Value Date   WBC 11.4 (H) 08/12/2019   HGB 13.4 08/12/2019   HCT 40.4 08/12/2019   MCV 87.4 08/12/2019   PLT 280 08/12/2019    Lab Results  Component Value Date   NA 136 08/12/2019   K 4.5 08/12/2019   CL 99 08/12/2019   CO2 29 08/12/2019   BUN 22 08/12/2019   CREATININE 0.91 08/13/2019   GLUCOSE 161 (H) 08/12/2019   ALT 17 02/12/2017    Lab Results  Component Value Date   CHOL 127 01/18/2015   HDL 42.20 01/18/2015   LDLCALC 60 01/18/2015   TRIG 123.0 01/18/2015   CHOLHDL 3 01/18/2015     --------------------------------------------------------------------------------------------------  ASSESSMENT AND PLAN: Mallory Arellano Mallory Hally, MD 09/07/2020 2:52 PM

## 2020-09-09 ENCOUNTER — Ambulatory Visit: Payer: Medicare Other | Admitting: Internal Medicine

## 2020-09-10 ENCOUNTER — Encounter: Payer: Self-pay | Admitting: Internal Medicine

## 2020-09-25 ENCOUNTER — Emergency Department: Payer: Medicare Other

## 2020-09-25 ENCOUNTER — Other Ambulatory Visit: Payer: Self-pay

## 2020-09-25 DIAGNOSIS — Z79899 Other long term (current) drug therapy: Secondary | ICD-10-CM | POA: Diagnosis not present

## 2020-09-25 DIAGNOSIS — Z7951 Long term (current) use of inhaled steroids: Secondary | ICD-10-CM | POA: Insufficient documentation

## 2020-09-25 DIAGNOSIS — I10 Essential (primary) hypertension: Secondary | ICD-10-CM | POA: Diagnosis not present

## 2020-09-25 DIAGNOSIS — R059 Cough, unspecified: Secondary | ICD-10-CM | POA: Insufficient documentation

## 2020-09-25 DIAGNOSIS — I480 Paroxysmal atrial fibrillation: Secondary | ICD-10-CM | POA: Diagnosis not present

## 2020-09-25 DIAGNOSIS — R5383 Other fatigue: Secondary | ICD-10-CM | POA: Insufficient documentation

## 2020-09-25 DIAGNOSIS — R0609 Other forms of dyspnea: Secondary | ICD-10-CM | POA: Diagnosis not present

## 2020-09-25 DIAGNOSIS — E119 Type 2 diabetes mellitus without complications: Secondary | ICD-10-CM | POA: Insufficient documentation

## 2020-09-25 DIAGNOSIS — Z7984 Long term (current) use of oral hypoglycemic drugs: Secondary | ICD-10-CM | POA: Diagnosis not present

## 2020-09-25 DIAGNOSIS — M79602 Pain in left arm: Secondary | ICD-10-CM | POA: Diagnosis not present

## 2020-09-25 DIAGNOSIS — J4530 Mild persistent asthma, uncomplicated: Secondary | ICD-10-CM | POA: Diagnosis not present

## 2020-09-25 DIAGNOSIS — Z7901 Long term (current) use of anticoagulants: Secondary | ICD-10-CM | POA: Insufficient documentation

## 2020-09-25 LAB — COMPREHENSIVE METABOLIC PANEL
ALT: 15 U/L (ref 0–44)
AST: 17 U/L (ref 15–41)
Albumin: 4.6 g/dL (ref 3.5–5.0)
Alkaline Phosphatase: 49 U/L (ref 38–126)
Anion gap: 11 (ref 5–15)
BUN: 18 mg/dL (ref 8–23)
CO2: 27 mmol/L (ref 22–32)
Calcium: 9.6 mg/dL (ref 8.9–10.3)
Chloride: 103 mmol/L (ref 98–111)
Creatinine, Ser: 0.98 mg/dL (ref 0.44–1.00)
GFR, Estimated: 59 mL/min — ABNORMAL LOW (ref >60)
Glucose, Bld: 131 mg/dL — ABNORMAL HIGH (ref 70–99)
Potassium: 4 mmol/L (ref 3.5–5.1)
Sodium: 141 mmol/L (ref 135–145)
Total Bilirubin: 0.8 mg/dL (ref 0.3–1.2)
Total Protein: 7.7 g/dL (ref 6.5–8.1)

## 2020-09-25 LAB — CBC WITH DIFFERENTIAL/PLATELET
Abs Immature Granulocytes: 0.03 10*3/uL (ref 0.00–0.07)
Basophils Absolute: 0 10*3/uL (ref 0.0–0.1)
Basophils Relative: 0 %
Eosinophils Absolute: 0.2 10*3/uL (ref 0.0–0.5)
Eosinophils Relative: 2 %
HCT: 42.2 % (ref 36.0–46.0)
Hemoglobin: 13.6 g/dL (ref 12.0–15.0)
Immature Granulocytes: 0 %
Lymphocytes Relative: 39 %
Lymphs Abs: 4.2 10*3/uL — ABNORMAL HIGH (ref 0.7–4.0)
MCH: 28 pg (ref 26.0–34.0)
MCHC: 32.2 g/dL (ref 30.0–36.0)
MCV: 86.8 fL (ref 80.0–100.0)
Monocytes Absolute: 0.6 10*3/uL (ref 0.1–1.0)
Monocytes Relative: 6 %
Neutro Abs: 5.6 10*3/uL (ref 1.7–7.7)
Neutrophils Relative %: 53 %
Platelets: 273 10*3/uL (ref 150–400)
RBC: 4.86 MIL/uL (ref 3.87–5.11)
RDW: 13.3 % (ref 11.5–15.5)
WBC: 10.7 10*3/uL — ABNORMAL HIGH (ref 4.0–10.5)
nRBC: 0 % (ref 0.0–0.2)

## 2020-09-25 LAB — TROPONIN I (HIGH SENSITIVITY): Troponin I (High Sensitivity): 37 ng/L — ABNORMAL HIGH (ref <18)

## 2020-09-25 NOTE — ED Triage Notes (Signed)
Pt states she has had intermittent left shoulder pain for the past few days, pt states she has hx of afib. Denies chest pain or shortness of breathe. Pt states the pain is sharp and deep in her shoulder denies injury to arm.

## 2020-09-26 ENCOUNTER — Emergency Department
Admission: EM | Admit: 2020-09-26 | Discharge: 2020-09-26 | Disposition: A | Discharge status: Home / Self Care | Payer: Medicare Other | Attending: Emergency Medicine | Admitting: Emergency Medicine

## 2020-09-26 DIAGNOSIS — M79602 Pain in left arm: Secondary | ICD-10-CM | POA: Diagnosis not present

## 2020-09-26 DIAGNOSIS — R059 Cough, unspecified: Secondary | ICD-10-CM

## 2020-09-26 DIAGNOSIS — R0600 Dyspnea, unspecified: Secondary | ICD-10-CM

## 2020-09-26 DIAGNOSIS — R0609 Other forms of dyspnea: Secondary | ICD-10-CM

## 2020-09-26 LAB — TROPONIN I (HIGH SENSITIVITY): Troponin I (High Sensitivity): 30 ng/L — ABNORMAL HIGH (ref <18)

## 2020-09-26 MED ORDER — DOXYCYCLINE HYCLATE 100 MG PO TABS
100.0000 mg | ORAL_TABLET | Freq: Once | ORAL | Status: AC
  Administered 2020-09-26: 100 mg via ORAL
  Filled 2020-09-26: qty 1

## 2020-09-26 MED ORDER — PREDNISONE 10 MG (21) PO TBPK: ORAL_TABLET | ORAL | 0 refills | Status: DC

## 2020-09-26 MED ORDER — PREDNISONE 20 MG PO TABS
60.0000 mg | ORAL_TABLET | Freq: Once | ORAL | Status: AC
  Administered 2020-09-26: 60 mg via ORAL
  Filled 2020-09-26: qty 3

## 2020-09-26 MED ORDER — DOXYCYCLINE HYCLATE 50 MG PO CAPS: 50.0000 mg | ORAL_CAPSULE | Freq: Two times a day (BID) | ORAL | 0 refills | Status: DC

## 2020-09-26 NOTE — Discharge Instructions (Addendum)
I have provided you with a prescription for antibiotics and steroids to treat possible infection, inflammation that was seen on the CT of your lungs at the beginning of April.  If this does not improve your symptoms, I recommend close follow-up with your primary care doctor.  Your cardiac work-up today was very reassuring.

## 2020-09-26 NOTE — ED Provider Notes (Signed)
[facility] Emergency Department Provider Note  ____________________________________________   Event Date/Time   First MD Initiated Contact with Patient 09/26/20 0256     (approximate)  I have reviewed the triage vital signs and the nursing notes.   HISTORY  Chief Complaint SOB, cough, L arm pain   HPI Mallory Arellano is a 80 y.o. female with h/o a fib on Xarelto, HTN, HLD, DM, asthma who presents to the emergency department with multiple complaints.  Cough with yellow sputum production worse with lying down x 1 - 2 months.  Has had shortness of breath with exertion and fatigue.  No chest pain.  No SOB.  No fever.  No nausea or vomiting.  Has no sweating.  Has had lightheadedness intermittently and hot flashes.    Has had recent stress test, echocardiogram and noncontrast CT of the chest.  Is being followed by primary care, cardiology and pulmonology.  States tonight she started having left upper arm pain that concerned her.  Left arm pain that is sharp started at 1pm yesterday that is intermittent that only lasts a few seconds and resolves without A/A factors.  No injury.  No swelling.  No history of PE or DVT.  Currently asymptomatic.  She states that she is concerned because she is coughing up yellow sputum and feels that she would benefit from antibiotics and/or steroids.  Cardiologist - Dr. Clayborn Bigness Pulmonologist - Dr. Raul Del    Echo 08/28/20:  INTERPRETATION  NORMAL LEFT VENTRICULAR SYSTOLIC FUNCTION  WITH MILD LVH  NORMAL RIGHT VENTRICULAR SYSTOLIC FUNCTION  NO VALVULAR STENOSIS  TRIVIAL TR  MILD MR  EF >55%    Stress test 08/06/20:  Normal myocardial perfusion scan no evidence of stress-induced  myocardial ischemia ejection fraction around 50% there is evidence of LVH  conclusion low risk scan   CT chest 08/30/20:  IMPRESSION: 1. Minimal irregular and ground-glass nodularity in the dependent bilateral lung bases, nonspecific and  infectious or inflammatory. 2. Cardiomegaly and coronary artery disease. 3. No CT findings of the chest to explain right-sided back pain.  Past Medical History:  Diagnosis Date  . A-fib ([place])   . Arthritis   . Asthma   . Diabetes mellitus without complication ([place])   . Dysrhythmia   . GERD (gastroesophageal reflux disease)   . Hypercholesteremia   . Hypertension   . Lower extremity edema   . Palpitation   . UTI (urinary tract infection)     Patient Active Problem List   Diagnosis Date Noted  . Primary localized osteoarthritis of right hip 11/11/2017  . Symptomatic sinus bradycardia 02/12/2017  . Chronic cough 06/18/2015  . Plantar fascial fibromatosis 06/18/2015  . Preventative health care 01/18/2015  . Paroxysmal atrial fibrillation ([place]) 12/30/2014  . Type 2 diabetes mellitus without complication ([place]) 0000000  . Hypertension, essential, benign 10/16/2014  . Mild persistent chronic asthma without complication 0000000  . Allergic rhinitis 10/16/2014  . GERD (gastroesophageal reflux disease) 10/16/2014  . HLD (hyperlipidemia)   . Arthritis of knee, degenerative 09/07/2014    Past Surgical History:  Procedure Laterality Date  . CATARACT EXTRACTION, BILATERAL    . 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 WISDOM TOOTH EXTRACTION      Prior to Admission medications   Medication Sig Start Date End Date Taking? Authorizing Provider  doxycycline (VIBRAMYCIN) 50 MG capsule  Take 1 capsule (50 mg total) by mouth 2 (two) times daily. 09/26/20  Yes Linder Prajapati, Delice Bison, DO  predniSONE (STERAPRED UNI-PAK 21 TAB) 10 MG (21) TBPK tablet Take as directed 09/26/20  Yes Dala Breault N, DO  amLODipine (NORVASC) 5 MG tablet Take 1 tablet (5 mg total) by mouth daily. 08/14/19   Jennye Boroughs, MD  Besifloxacin HCl (BESIVANCE) 0.6 % SUSP Place 1  drop into the left eye 3 (three) times daily.    [provider]  Bromfenac Sodium (PROLENSA) 0.07 % SOLN Place 1 drop into the left eye at bedtime.    [provider]  budesonide-formoterol (SYMBICORT) 160-4.5 MCG/ACT inhaler Inhale 2 puffs into the lungs 2 (two) times daily.    [provider]  diclofenac Sodium (VOLTAREN) 1 % GEL Apply 2 g topically 2 (two) times daily.    [provider]  Difluprednate (DUREZOL) 0.05 % EMUL Place 1 drop into the left eye 3 (three) times daily.    [provider]  flecainide (TAMBOCOR) 50 MG tablet Take 1 tablet (50 mg total) by mouth every 12 (twelve) hours. 08/15/19   Jennye Boroughs, MD  fluticasone (FLONASE) 50 MCG/ACT nasal spray Place 2 sprays into both nostrils daily.    [provider]  furosemide (LASIX) 20 MG tablet Take 1 tablet (20 mg total) by mouth daily as needed for edema. 08/13/19   Jennye Boroughs, MD  glimepiride (AMARYL) 4 MG tablet Take 4 mg by mouth daily with breakfast.    [provider]  hydrochlorothiazide (HYDRODIURIL) 25 MG tablet Take 25 mg by mouth daily.    [provider]  hydrOXYzine (ATARAX/VISTARIL) 25 MG tablet Take 25 mg by mouth 3 (three) times daily as needed for itching.     [provider]  levalbuterol Penne Lash) 1.25 MG/3ML nebulizer solution Take 1.25 mg by nebulization every 6 (six) hours as needed for wheezing.    [provider]  metoprolol succinate (TOPROL-XL) 25 MG 24 hr tablet Take 0.5 tablets (12.5 mg total) by mouth daily. Take with or immediately following a meal. 08/15/19   Jennye Boroughs, MD  milk thistle 175 MG tablet Take 175 mg by mouth daily.    [provider]  ondansetron (ZOFRAN) 4 MG tablet Take 1 tablet (4 mg total) by mouth every 8 (eight) hours as needed for nausea or vomiting. 08/13/19   Jennye Boroughs, MD  pantoprazole (PROTONIX) 40 MG tablet Take 40 mg by mouth 2 (two) times daily.  09/05/14   [provider]  rivaroxaban (XARELTO) 20 MG TABS tablet Take 20 mg by mouth daily with breakfast.    [provider]  SUMAtriptan (IMITREX) 25 MG tablet Take 1 tablet by mouth every 2 (two) hours as needed for migraine.     [provider]  Vitamin D, Ergocalciferol, (DRISDOL) 1.25 MG (50000 UNIT) CAPS capsule Take 50,000 Units by mouth every Monday.    [provider]    Allergies Atorvastatin, Losartan, Sitagliptin, Metformin and related, Warfarin sodium, and Penicillins  Family History  Problem Relation Age of Onset  . Diabetes Mother   . Hypertension Mother   . Hypertension Brother   . Diabetes Brother   . Other Father        "natural causes"    Social History Social History   Tobacco Use  . Smoking status: Never Smoker  . Smokeless tobacco: Never Used  Vaping Use  . Vaping Use: Never used  Substance Use Topics  .  Alcohol use: Not Currently    Alcohol/week: 0.0 standard drinks  . Drug use: No    Review of Systems Constitutional: No fever. Eyes: No visual changes. ENT: No sore throat. Cardiovascular: Denies chest pain. Respiratory: + shortness of breath. Gastrointestinal: No nausea, vomiting, diarrhea. Genitourinary: Negative for dysuria. Musculoskeletal: Negative for back pain. Skin: Negative for rash. Neurological: Negative for focal weakness or numbness.  ____________________________________________   PHYSICAL EXAM:  VITAL SIGNS: ED Triage Vitals  Enc Vitals Group     BP 09/25/20 2217 (!) 184/89     Pulse Rate 09/25/20 2209 76     Resp 09/25/20 2209 18     Temp 09/25/20 2209 98.5 F (36.9 C)     Temp Source 09/25/20 2209 Oral     SpO2 09/25/20 2209 97 %     Weight 09/25/20 2215 183 lb (83 kg)     Height 09/25/20 2215 5\' 6"  (1.676 m)     Head Circumference --      Peak Flow --      Pain Score 09/25/20 2215 0     Pain Loc --      Pain Edu? --      Excl. in [place]? --    CONSTITUTIONAL: Alert and oriented and responds  appropriately to questions. Well-appearing; well-nourished, appears younger than stated age HEAD: Normocephalic EYES: Conjunctivae clear, pupils appear equal, EOM appear intact ENT: normal nose; moist mucous membranes NECK: Supple, normal ROM CARD: RRR; S1 and S2 appreciated; no murmurs, no clicks, no rubs, no gallops RESP: Normal chest excursion without splinting or tachypnea; breath sounds clear and equal bilaterally; no wheezes, no rhonchi, no rales, no hypoxia or respiratory distress, speaking full sentences ABD/GI: Normal bowel sounds; non-distended; soft, non-tender, no rebound, no guarding, no peritoneal signs, no hepatosplenomegaly BACK: The back appears normal EXT: Normal ROM in all joints; no deformity noted, no edema; no cyanosis, no tenderness to palpation throughout the left upper extremity, 2+ radial and DP pulses bilaterally, no calf tenderness or calf swelling, compartments in the left arm are soft, no redness or significant warmth noted, no fluctuance or induration, no rash or other lesions, no bony deformity or bony tenderness, no joint effusions SKIN: Normal color for age and race; warm; no rash on exposed skin NEURO: Moves all extremities equally, normal speech PSYCH: The patient's mood and manner are appropriate.  ____________________________________________   LABS (all labs ordered are listed, but only abnormal results are displayed)  Labs Reviewed  CBC WITH DIFFERENTIAL/PLATELET - Abnormal; Notable for the following components:      Result Value   WBC 10.7 (*)    Lymphs Abs 4.2 (*)    All other components within normal limits  COMPREHENSIVE METABOLIC PANEL - Abnormal; Notable for the following components:   Glucose, Bld 131 (*)    GFR, Estimated 59 (*)    All other components within normal limits  TROPONIN I (HIGH SENSITIVITY) - Abnormal; Notable for the following components:   Troponin I (High Sensitivity) 37 (*)    All other components within normal limits   TROPONIN I (HIGH SENSITIVITY) - Abnormal; Notable for the following components:   Troponin I (High Sensitivity) 30 (*)    All other components within normal limits   ____________________________________________  EKG   EKG Interpretation  Date/Time:  Wednesday Sep 25 2020 22:22:02 EDT Ventricular Rate:  69 PR Interval:  202 QRS Duration: 84 QT Interval:  426 QTC Calculation: 456 R Axis:   -41 Text Interpretation: Sinus  rhythm with marked sinus arrhythmia Left axis deviation Moderate voltage criteria for LVH, may be normal variant ( R in aVL , Cornell product ) Abnormal ECG No significant change since last tracing Confirmed by Pryor Curia [phone]) on 09/26/2020 3:03:37 AM       ____________________________________________  RADIOLOGY Jessie Foot Lee-Anne Flicker, personally viewed and evaluated these images (plain radiographs) as part of my medical decision making, as well as reviewing the written report by the radiologist.  ED MD interpretation: Chest x-ray clear.  Official radiology report(s): DG Chest 2 View  Result Date: 09/25/2020 CLINICAL DATA:  Chest pain EXAM: CHEST - 2 VIEW COMPARISON:  08/12/2019 FINDINGS: Lungs are clear. No pneumothorax or pleural effusion. Mild cardiomegaly is stable. Pulmonary vascularity is normal. No acute bone abnormality. IMPRESSION: Stable cardiomegaly. Electronically Signed   By: Fidela Salisbury MD   On: 09/25/2020 22:38    ____________________________________________   PROCEDURES  Procedure(s) performed (including Critical Care):  Procedures   ____________________________________________   INITIAL IMPRESSION / ASSESSMENT AND PLAN / ED COURSE  As part of my medical decision making, I reviewed the following data within the [facility] notes reviewed and incorporated, Labs reviewed , EKG interpreted , Old EKG reviewed, Old chart reviewed, Radiograph reviewed  and Notes from prior ED visits         Patient here with  complaints of intermittent left arm pain that is new for the past day.  No pain currently.  No signs of DVT, arterial obstruction, fracture, cellulitis, abscess, septic arthritis, gout, joint effusion, compartment syndrome on exam.  I do not think this is her anginal equivalent as it seems very atypical and she has had a normal cardiac work-up here including 2 flat high-sensitivity troponins.  EKG shows no new ischemic change.  Hemodynamically stable.  She also seems concerned about the shortness of breath with exertion, fatigue and cough that she has had for the past 1 to 2 months.  She has had an extensive outpatient work-up with reassuring stress test and echocardiogram but did have a noncontrast CT of her chest that showed groundglass nodularities in the bilateral lung bases that could be infectious versus inflammatory in nature.  Given her history of asthma and that she is coughing up yellow sputum I feel it is reasonable to trial antibiotics and steroids to see if this helps her feel better.  She is very pleased with this plan.  Will discharge on doxycycline and with steroid taper.  She has great outpatient follow-up and seems very reliable and will return to the ED if she has any worsening symptoms.  Given she is asymptomatic currently I have low suspicion for ACS, PE, dissection.  Currently hemodynamically stable.  Will discharge home.   At this time, I do not feel there is any life-threatening condition present. I have reviewed, interpreted and discussed all results (EKG, imaging, lab, urine as appropriate) and exam findings with patient/family. I have reviewed nursing notes and appropriate previous records.  I feel the patient is safe to be discharged home without further emergent workup and can continue workup as an outpatient as needed. Discussed usual and customary return precautions. Patient/family verbalize understanding and are comfortable with this plan.  Outpatient follow-up has been  provided as needed. All questions have been answered.   ____________________________________________   FINAL CLINICAL IMPRESSION(S) / ED DIAGNOSES  Final diagnoses:  Left arm pain  DOE (dyspnea on exertion)  Cough     ED Discharge Orders  Ordered    doxycycline (VIBRAMYCIN) 50 MG capsule  2 times daily        09/26/20 0404    predniSONE (STERAPRED UNI-PAK 21 TAB) 10 MG (21) TBPK tablet        09/26/20 0404          *Please note:  Mallory Arellano was evaluated in Emergency Department on 09/26/2020 for the symptoms described in the history of present illness. She was evaluated in the context of the global COVID-19 pandemic, which necessitated consideration that the patient might be at risk for infection with the SARS-CoV-2 virus that causes COVID-19. Institutional protocols and algorithms that pertain to the evaluation of patients at risk for COVID-19 are in a state of rapid change based on information released by regulatory bodies including the CDC and federal and state organizations. These policies and algorithms were followed during the patient's care in the ED.  Some ED evaluations and interventions may be delayed as a result of limited staffing during and the pandemic.*   Note:  This document was prepared using Dragon voice recognition software and may include unintentional dictation errors.   Katlin Bortner, Delice Bison, DO 09/26/20 [phone]

## 2020-09-26 NOTE — ED Notes (Signed)
EDP at bedside  

## 2020-09-26 NOTE — ED Notes (Addendum)
Pt alert and oriented X 4, stable for discharge. RR even and unlabored, color WNL. Discussed discharge instructions and follow-up as directed. Discharge medications discussed if prescribed. Pt had opportunity to ask questions, and RN to provide patient/family eduction. Printed e signature obtained for discharge.

## 2020-10-04 ENCOUNTER — Ambulatory Visit (INDEPENDENT_AMBULATORY_CARE_PROVIDER_SITE_OTHER): Payer: Medicare Other

## 2020-10-04 ENCOUNTER — Ambulatory Visit (INDEPENDENT_AMBULATORY_CARE_PROVIDER_SITE_OTHER): Payer: Medicare Other | Admitting: Cardiovascular Disease

## 2020-10-04 ENCOUNTER — Encounter: Payer: Self-pay | Admitting: Cardiovascular Disease

## 2020-10-04 ENCOUNTER — Other Ambulatory Visit: Payer: Self-pay

## 2020-10-04 VITALS — BP 158/74 | HR 62 | Ht 66.0 in | Wt 187.0 lb

## 2020-10-04 DIAGNOSIS — R001 Bradycardia, unspecified: Secondary | ICD-10-CM

## 2020-10-04 DIAGNOSIS — I480 Paroxysmal atrial fibrillation: Secondary | ICD-10-CM | POA: Diagnosis not present

## 2020-10-04 DIAGNOSIS — E119 Type 2 diabetes mellitus without complications: Secondary | ICD-10-CM | POA: Diagnosis not present

## 2020-10-04 DIAGNOSIS — R002 Palpitations: Secondary | ICD-10-CM

## 2020-10-04 DIAGNOSIS — I10 Essential (primary) hypertension: Secondary | ICD-10-CM | POA: Diagnosis not present

## 2020-10-04 DIAGNOSIS — Z794 Long term (current) use of insulin: Secondary | ICD-10-CM

## 2020-10-04 DIAGNOSIS — R053 Chronic cough: Secondary | ICD-10-CM

## 2020-10-04 DIAGNOSIS — K219 Gastro-esophageal reflux disease without esophagitis: Secondary | ICD-10-CM

## 2020-10-04 MED ORDER — EZETIMIBE 10 MG PO TABS: 10.0000 mg | ORAL_TABLET | Freq: Every day | ORAL | 3 refills | Status: DC

## 2020-10-04 NOTE — Patient Instructions (Addendum)
GI doctor: Dr. Allen Norris [address], [place], [place] [address] [phone]   Medication Instructions:  No changes  If you need a refill on your cardiac medications before your next appointment, please call your pharmacy.    Lab work: No new labs needed   If you have labs (blood work) drawn today and your tests are completely normal, you will receive your results only by: Marland Kitchen MyChart Message (if you have MyChart) OR . A paper copy in the mail If you have any lab test that is abnormal or we need to change your treatment, we will call you to review the results.   Testing/Procedures: Zio monitor for atrial fibrillation, palpitations  Your physician has recommended that you wear a Zio XT (heart) monitor x 14 days.   This monitor is a medical device that records the heart's electrical activity. Doctors most often use these monitors to diagnose arrhythmias. Arrhythmias are problems with the speed or rhythm of the heartbeat. The monitor is a small device applied to your chest. You can wear one while you do your normal daily activities. While wearing this monitor if you have any symptoms to push the button and record what you felt. Once you have worn this monitor for the period of time provider prescribed (Usually 14 days), you will return the monitor device in the postage paid box. Once it is returned they will download the data collected and provide [place] with a report which the provider will then review and we will call you with those results. Important tips:  1. Avoid showering during the first 24 hours of wearing the monitor. 2. Avoid excessive sweating to help maximize wear time. 3. Do not submerge the device, no hot tubs, and no swimming pools. 4. Keep any lotions or oils away from the patch. 5. After 24 hours you may shower with the patch on. Take brief showers with your back facing the shower head.  6. Do not remove patch once it has been placed because that will interrupt data  and decrease adhesive wear time. 7. Push the button when you have any symptoms and write down what you were feeling. 8. Once you have completed wearing your monitor, remove and place into box which has postage paid and place in your outgoing mailbox.  9. If for some reason you have misplaced your box then call our office and we can provide another box and/or mail it off for you.    Follow-Up: At [facility], you and your health needs are our priority.  As part of our continuing mission to provide you with exceptional heart care, we have created designated Provider Care Teams.  These Care Teams include your primary Cardiologist (physician) and Advanced Practice Providers (APPs -  Physician Assistants and Nurse Practitioners) who all work together to provide you with the care you need, when you need it.  . You will need a follow up appointment as needed  . Providers on your designated Care Team:   . Murray Hodgkins, NP . Christell Faith, PA-C . Marrianne Mood, PA-C  Any Other Special Instructions Will Be Listed Below (If Applicable).  COVID-19 Vaccine Information can be found at: [email or web address] For questions related to vaccine distribution or appointments, please email [email or web address] or call [phone].

## 2020-10-04 NOTE — Progress Notes (Signed)
Cardiology Office Note  Date:  10/04/2020   ID:  EDONA SCHREFFLER, DOB 24-Feb-1941, MRN [id number]  PCP:  Sharyne Peach, MD   Chief Complaint  Patient presents with  . New Patient (Initial Visit)    Patient wants second opinion for irregular HR. Meds reviewed verbally with patient.     HPI:  Ms. Deatrice Spanbauer is a 80 year old woman with past medical history of Diabetes type 2 Bradycardia Paroxysmal atrial fibrillation , on flecainide Hypertension Hyperlipidemia who presents for new patient evaluation of her tachypalpitations  Followed by Dr. Clayborn Bigness for many years Recently Seen in the emergency room March 2021 with heart palpitations and chest pain, records reviewed Heart rates 30s to 48s Was admitted for observation, flecainide metoprolol Cardizem held, continued on Xarelto  In the emergency room May 2022 Short of breath cough left arm pain Yellow sputum Followed by Dr. Raul Del  Has been troubled by chronic coughing Recently placed on prednisone, cough getting better Muscle pain from cough  Troubled by episodes of hot flashes  Main reason for presentation is she continues to have tachypalpitations of unclear etiology She is back on flecainide but this does not seem to control her episodes Does not want to go back on beta-blockers  EKG personally reviewed by myself on todays visit Shows normal sinus rhythm rate 62 bpm PACs noted  Cardiac imaging reviewed The echocardiogram revealed normal LV systolic function with mild LVH, mild mitral regurgitation estimated EF of >55%.  CT chest April 2022 . Minimal irregular and ground-glass nodularity in the dependent bilateral lung bases, nonspecific and infectious or inflammatory. 2. Cardiomegaly and coronary artery disease. 3. No CT findings of the chest to explain right-sided back pain.  Echocardiogram 2D complete: (08/28/2020) INTERPRETATION NORMAL LEFT VENTRICULAR SYSTOLIC FUNCTION WITH MILD LVH NORMAL RIGHT VENTRICULAR  SYSTOLIC FUNCTION NO VALVULAR STENOSIS TRIVIAL TR MILD MR EF >55%  NM Myocardial Perfusion SPECT multiple (stress and rest): (08/06/2020) IMPRESSION: Normal myocardial perfusion scan no evidence of stress-induced myocardial ischemia ejection fraction around 50% there is evidence of LVH conclusion low risk scan  Echocardiogram 2D complete: (09/08/2019) INTERPRETATION NORMAL LEFT VENTRICULAR SYSTOLIC FUNCTION WITH AN ESTIMATED EF = >55 % NORMAL RIGHT VENTRICULAR SYSTOLIC FUNCTION MILD TRICUSPID AND MITRAL VALVE INSUFFICIENCY NO VALVULAR STENOSIS MILD LA ENLARGEMENT MODERATE LVH  NM Myocardial Perfusion SPECT multiple (stress and rest): (09/08/2019) IMPRESSION: Normal myocardial perfusion scan no evidence of stress-induced myocardial ischemia ejection fraction 57% conclusion negative scan  Echocardiogram 2D complete: (02/14/2017) Study Conclusions  - Left ventricle: The cavity sizewas normal. There was moderate  concentric hypertrophy. Systolic function was normal. The  estimated ejection fraction was in the range of 60% to 65%. Wall  motion was normal; there were no regional wall motion  abnormalities. The study is not technically sufficient to allow  evaluation of LV diastolic function.  - Left atrium: The atrium was mildly dilated.  - Right atrium: The atrium was mildly dilated.    PMH:   has a past medical history of A-fib ([place]), Arthritis, Asthma, Diabetes mellitus without complication ([place]), Dysrhythmia, GERD (gastroesophageal reflux disease), Hypercholesteremia, Hypertension, Lower extremity edema, Palpitation, and UTI (urinary tract infection).  PSH:    Past Surgical History:  Procedure Laterality Date  . CATARACT EXTRACTION, BILATERAL    . 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 WISDOM TOOTH EXTRACTION  Current Outpatient Medications  Medication Sig Dispense Refill  . amLODipine (NORVASC) 5 MG tablet Take 1 tablet (5 mg total) by mouth daily. 30 tablet 0  . Besifloxacin HCl (BESIVANCE) 0.6 % SUSP Place 1 drop into the left eye 3 (three) times daily.    . Bromfenac Sodium (PROLENSA) 0.07 % SOLN Place 1 drop into the left eye at bedtime.    . budesonide-formoterol (SYMBICORT) 160-4.5 MCG/ACT inhaler Inhale 2 puffs into the lungs 2 (two) times daily.    . diclofenac Sodium (VOLTAREN) 1 % GEL Apply 2 g topically 2 (two) times daily.    . Difluprednate (DUREZOL) 0.05 % EMUL Place 1 drop into the left eye 3 (three) times daily.    . flecainide (TAMBOCOR) 50 MG tablet Take 1 tablet (50 mg total) by mouth every 12 (twelve) hours. 60 tablet 0  . fluticasone (FLONASE) 50 MCG/ACT nasal spray Place 2 sprays into both nostrils daily.    . furosemide (LASIX) 20 MG tablet Take 1 tablet (20 mg total) by mouth daily as needed for edema.    Marland Kitchen glimepiride (AMARYL) 4 MG tablet Take 4 mg by mouth daily with breakfast.    . hydrochlorothiazide (HYDRODIURIL) 25 MG tablet Take 25 mg by mouth daily.    . hydrOXYzine (ATARAX/VISTARIL) 25 MG tablet Take 25 mg by mouth 3 (three) times daily as needed for itching.     . levalbuterol (XOPENEX) 1.25 MG/3ML nebulizer solution Take 1.25 mg by nebulization every 6 (six) hours as needed for wheezing.    . ondansetron (ZOFRAN) 4 MG tablet Take 1 tablet (4 mg total) by mouth every 8 (eight) hours as needed for nausea or vomiting. 15 tablet 0  . pantoprazole (PROTONIX) 40 MG tablet Take 40 mg by mouth 2 (two) times daily.   11  . predniSONE (STERAPRED UNI-PAK 21 TAB) 10 MG (21) TBPK tablet Take as directed 21 tablet 0  . rivaroxaban (XARELTO) 20 MG TABS tablet Take 20 mg by mouth daily with breakfast.    . SUMAtriptan (IMITREX) 25 MG tablet Take 1 tablet by mouth every 2 (two) hours as needed for migraine.     . Vitamin D, Ergocalciferol, (DRISDOL) 1.25 MG (50000 UNIT) CAPS  capsule Take 50,000 Units by mouth every Monday.     No current facility-administered medications for this visit.     Allergies:   Atorvastatin, Losartan, Sitagliptin, Metformin and related, Warfarin sodium, and Penicillins   Social History:  The patient  reports that she has never smoked. She has never used smokeless tobacco. She reports previous alcohol use. She reports that she does not use drugs.   Family History:   family history includes Diabetes in her brother and mother; Hypertension in her brother and mother; Other in her father.    Review of Systems: Review of Systems  Constitutional: Negative.   HENT: Negative.   Respiratory: Negative.   Cardiovascular: Positive for palpitations.  Gastrointestinal: Negative.   Musculoskeletal: Negative.   Neurological: Negative.   Psychiatric/Behavioral: Negative.   All other systems reviewed and are negative.   PHYSICAL EXAM: VS:  BP (!) 158/74 (BP Location: Right Arm, Patient Position: Sitting, Cuff Size: Large)   Pulse 62   Ht 5\' 6"  (1.676 m)   Wt 187 lb (84.8 kg)   SpO2 98%   BMI 30.18 kg/m  , BMI Body mass index is 30.18 kg/m. GEN: Well nourished, well developed, in no acute distress HEENT: normal Neck: no JVD, carotid bruits, or masses Cardiac: RRR;  no murmurs, rubs, or gallops,no edema  Respiratory:  clear to auscultation bilaterally, normal work of breathing GI: soft, nontender, nondistended, + BS MS: no deformity or atrophy Skin: warm and dry, no rash Neuro:  Strength and sensation are intact Psych: euthymic mood, full affect    Recent Labs: 09/25/2020: ALT 15; BUN 18; Creatinine, Ser 0.98; Hemoglobin 13.6; Platelets 273; Potassium 4.0; Sodium 141    Lipid Panel Lab Results  Component Value Date   CHOL 127 01/18/2015   HDL 42.20 01/18/2015   LDLCALC 60 01/18/2015   TRIG 123.0 01/18/2015      Wt Readings from Last 3 Encounters:  10/04/20 187 lb (84.8 kg)  09/25/20 183 lb (83 kg)  03/28/20 194 lb 1.6 oz  (88 kg)       ASSESSMENT AND PLAN:  Problem List Items Addressed This Visit      Cardiology Problems   Hypertension, essential, benign   Relevant Orders   EKG 12-Lead   Paroxysmal atrial fibrillation (HCC) - Primary   Relevant Orders   EKG 12-Lead   Symptomatic sinus bradycardia     Other   Type 2 diabetes mellitus without complication (HCC)   Chronic cough   GERD (gastroesophageal reflux disease)     Paroxysmal atrial fibrillation Etiology of her tachypalpitations is unclear, certainly could be breakthrough atrial fibrillation though unable to exclude other arrhythmia Discussed various treatment options with her, at her request we will order a ZIO monitor for 2-week. We will send the results to her and Dr. Clayborn Bigness. No changes to her rate control medications Ideally would like to see her on either low-dose beta-blocker or calcium channel blocker with her flecainide but she has had problems with bradycardia in the past and general feelings of fatigue  Coronary calcification/aortic atherosclerosis Very mild on CT scan, images pulled up and reviewed with her She is not on a statin but is interested in Zetia New prescription for Zetia 10 mg sent in as she would like aggressive management of lipids  Diabetes type 2 Mild coronary disease, Managed by primary care  Essential hypertension Does not have blood pressure cuff at home, she will buy a blood pressure cuff to monitor blood pressure at home Mildly elevated on today's visit No changes to medications     Total encounter time more than 60 minutes  Greater than 50% was spent in counseling and coordination of care with the patient    Signed, Esmond Plants, M.D., Ph.D. [place], [place]

## 2020-10-18 DIAGNOSIS — R002 Palpitations: Secondary | ICD-10-CM | POA: Diagnosis not present

## 2020-10-18 DIAGNOSIS — I480 Paroxysmal atrial fibrillation: Secondary | ICD-10-CM

## 2020-11-13 ENCOUNTER — Telehealth: Payer: Self-pay

## 2020-11-13 MED ORDER — METOPROLOL SUCCINATE ER 25 MG PO TB24: 12.5000 mg | ORAL_TABLET | Freq: Every day | ORAL | 3 refills | Status: AC

## 2020-11-13 NOTE — Telephone Encounter (Signed)
Was able to reach out to pt via phone and make contact to review their recent ZIO monitor results. Dr. Rockey Situ advised based on the current results   Event monitor  Frequent PACs noted up to 19% burden  Triggered events associated with these PACs  Would consider restarting metoprolol succinate 12.5 mg daily, stay on flecainide  On that low-dose , seemed to tolerate it relatively well, heart rates in themid to high 50s with stable blood pressure    Will send the monitor results to Dr. Clayborn Bigness, her main cardiologist, as requested per Dr. Rockey Situ.  Pt verbalized understanding, is thankful for the results call, all questions and concerns were address. Will call back for anything further, f/u as schedule.   metoprolol succinate 12.5 mg daily was sent back in to [facility]

## 2021-01-10 ENCOUNTER — Telehealth: Payer: Self-pay | Admitting: Podiatry

## 2021-01-10 NOTE — Telephone Encounter (Signed)
Pt left message stating she needs and appt to see a doctor and is interested in diabetic shoes. She has a bunion on her great toe that is painful..  I returned call and left message for pt to call us back to get scheduled with a doctor and they can discuss diabetic shoes at the appt and if pt qualifies then they can schedule her to be measured for them. She lives in [place] so ask what office she would like to go to.

## 2021-01-15 ENCOUNTER — Other Ambulatory Visit: Payer: Self-pay

## 2021-01-15 ENCOUNTER — Emergency Department
Admission: EM | Admit: 2021-01-15 | Discharge: 2021-01-15 | Disposition: A | Discharge status: Home / Self Care | Payer: Medicare Other | Attending: Emergency Medicine | Admitting: Emergency Medicine

## 2021-01-15 DIAGNOSIS — Z7951 Long term (current) use of inhaled steroids: Secondary | ICD-10-CM | POA: Insufficient documentation

## 2021-01-15 DIAGNOSIS — R030 Elevated blood-pressure reading, without diagnosis of hypertension: Secondary | ICD-10-CM | POA: Diagnosis present

## 2021-01-15 DIAGNOSIS — Z79899 Other long term (current) drug therapy: Secondary | ICD-10-CM | POA: Diagnosis not present

## 2021-01-15 DIAGNOSIS — Z7901 Long term (current) use of anticoagulants: Secondary | ICD-10-CM | POA: Diagnosis not present

## 2021-01-15 DIAGNOSIS — I4891 Unspecified atrial fibrillation: Secondary | ICD-10-CM | POA: Insufficient documentation

## 2021-01-15 DIAGNOSIS — Z7984 Long term (current) use of oral hypoglycemic drugs: Secondary | ICD-10-CM | POA: Insufficient documentation

## 2021-01-15 DIAGNOSIS — J4530 Mild persistent asthma, uncomplicated: Secondary | ICD-10-CM | POA: Diagnosis not present

## 2021-01-15 DIAGNOSIS — I10 Essential (primary) hypertension: Secondary | ICD-10-CM | POA: Insufficient documentation

## 2021-01-15 DIAGNOSIS — E119 Type 2 diabetes mellitus without complications: Secondary | ICD-10-CM | POA: Diagnosis not present

## 2021-01-15 DIAGNOSIS — Z96651 Presence of right artificial knee joint: Secondary | ICD-10-CM | POA: Insufficient documentation

## 2021-01-15 LAB — COMPREHENSIVE METABOLIC PANEL
ALT: 17 U/L (ref 0–44)
AST: 17 U/L (ref 15–41)
Albumin: 4 g/dL (ref 3.5–5.0)
Alkaline Phosphatase: 46 U/L (ref 38–126)
Anion gap: 7 (ref 5–15)
BUN: 23 mg/dL (ref 8–23)
CO2: 32 mmol/L (ref 22–32)
Calcium: 8.8 mg/dL — ABNORMAL LOW (ref 8.9–10.3)
Chloride: 102 mmol/L (ref 98–111)
Creatinine, Ser: 1.27 mg/dL — ABNORMAL HIGH (ref 0.44–1.00)
GFR, Estimated: 43 mL/min — ABNORMAL LOW (ref >60)
Glucose, Bld: 107 mg/dL — ABNORMAL HIGH (ref 70–99)
Potassium: 3.4 mmol/L — ABNORMAL LOW (ref 3.5–5.1)
Sodium: 141 mmol/L (ref 135–145)
Total Bilirubin: 0.9 mg/dL (ref 0.3–1.2)
Total Protein: 7 g/dL (ref 6.5–8.1)

## 2021-01-15 LAB — CBC
HCT: 42 % (ref 36.0–46.0)
Hemoglobin: 14.1 g/dL (ref 12.0–15.0)
MCH: 29.6 pg (ref 26.0–34.0)
MCHC: 33.6 g/dL (ref 30.0–36.0)
MCV: 88.2 fL (ref 80.0–100.0)
Platelets: 283 10*3/uL (ref 150–400)
RBC: 4.76 MIL/uL (ref 3.87–5.11)
RDW: 13.1 % (ref 11.5–15.5)
WBC: 9.1 10*3/uL (ref 4.0–10.5)
nRBC: 0 % (ref 0.0–0.2)

## 2021-01-15 MED ORDER — ACETAMINOPHEN 500 MG PO TABS
1000.0000 mg | ORAL_TABLET | Freq: Once | ORAL | Status: AC
  Administered 2021-01-15: 1000 mg via ORAL
  Filled 2021-01-15: qty 2

## 2021-01-15 NOTE — ED Triage Notes (Signed)
Pt states that her bp was elevated today at dr fleming's office with a systolic of 99991111. Pt states that she started developing a headaches and some pain in the back of her leg

## 2021-01-16 NOTE — ED Provider Notes (Signed)
[facility]  ____________________________________________   Event Date/Time   First MD Initiated Contact with Patient 01/15/21 2153     (approximate)  I have reviewed the triage vital signs and the nursing notes.   HISTORY  Chief Complaint Headache and Hypertension    HPI Mallory Arellano is a 80 y.o. female past medical history of atrial fibrillation, GERD, hypertension, diabetes who presents with elevated blood pressure reading.  Patient went to routine primary care appointment today and her blood pressure was in the [id number] systolic.  She notes that she is normally 120s to 140s.  She takes multiple blood pressure agents and has been compliant.  She does complain of a mild left-sided temporal headache that is intermittent and not associated visual change, numbness or weakness.  She denies chest pain, back pain, shortness of breath, abdominal pain or lower extremity edema.  Headache is similar to prior.         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   Preventative health care 01/18/2015   Paroxysmal atrial fibrillation ([place]) 12/30/2014   Type 2 diabetes mellitus without complication ([place]) 0000000   Hypertension, essential, benign 10/16/2014   Mild persistent chronic asthma without complication 0000000   Allergic rhinitis 10/16/2014   GERD (gastroesophageal reflux disease) 10/16/2014   HLD (hyperlipidemia)    Arthritis of knee, degenerative 09/07/2014    Past  Surgical History:  Procedure Laterality Date   CATARACT EXTRACTION, BILATERAL     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 Authorizing Provider  Besifloxacin HCl (BESIVANCE) 0.6 % SUSP Place 1 drop into the left eye 3 (three) times daily.    [provider]  Bromfenac Sodium (PROLENSA) 0.07 % SOLN Place 1 drop into the left eye at bedtime.    [provider]  budesonide-formoterol (SYMBICORT) 160-4.5 MCG/ACT inhaler Inhale 2 puffs into the lungs 2 (two) times daily.    [provider]  diclofenac Sodium (VOLTAREN) 1 % GEL Apply 2 g topically 2 (two) times daily.    [provider]  Difluprednate (DUREZOL) 0.05 % EMUL Place 1 drop into the left eye 3 (three) times daily.    [provider]  ezetimibe (ZETIA) 10 MG tablet Take 1 tablet (10 mg total) by mouth daily. 10/04/20   Minna Merritts, MD  flecainide (TAMBOCOR) 50 MG tablet Take 1 tablet (50 mg total) by mouth every 12 (twelve) hours. 08/15/19   Jennye Boroughs, MD  fluticasone (FLONASE) 50 MCG/ACT nasal spray Place 2 sprays into both nostrils daily.    [provider]  furosemide (LASIX) 20 MG tablet Take 1 tablet (20 mg total) by mouth daily as needed for edema. 08/13/19  Jennye Boroughs, MD  glimepiride (AMARYL) 4 MG tablet Take 4 mg by mouth daily with breakfast.    [provider]  hydrochlorothiazide (HYDRODIURIL) 25 MG tablet Take 25 mg by mouth daily.    [provider]  hydrOXYzine (ATARAX/VISTARIL) 25 MG tablet Take 25 mg by mouth 3 (three) times daily as needed for itching.     [provider]  levalbuterol Penne Lash) 1.25 MG/3ML nebulizer solution Take 1.25 mg by nebulization every 6 (six) hours as  needed for wheezing.    [provider]  metoprolol succinate (TOPROL-XL) 25 MG 24 hr tablet Take 0.5 tablets (12.5 mg total) by mouth daily. Take with or immediately following a meal. 11/13/20   Gollan, Kathlene November, MD  ondansetron (ZOFRAN) 4 MG tablet Take 1 tablet (4 mg total) by mouth every 8 (eight) hours as needed for nausea or vomiting. 08/13/19   Jennye Boroughs, MD  pantoprazole (PROTONIX) 40 MG tablet Take 40 mg by mouth 2 (two) times daily.  09/05/14   [provider]  predniSONE (STERAPRED UNI-PAK 21 TAB) 10 MG (21) TBPK tablet Take as directed 09/26/20   Ward, Delice Bison, DO  rivaroxaban (XARELTO) 20 MG TABS tablet Take 20 mg by mouth daily with breakfast.    [provider]  SUMAtriptan (IMITREX) 25 MG tablet Take 1 tablet by mouth every 2 (two) hours as needed for migraine.     [provider]  Vitamin D, Ergocalciferol, (DRISDOL) 1.25 MG (50000 UNIT) CAPS capsule Take 50,000 Units by mouth every Monday.    [provider]    Allergies Atorvastatin, Losartan, Sitagliptin, Metformin and related, Warfarin sodium, and Penicillins  Family History  Problem Relation Age of Onset   Diabetes Mother    Hypertension Mother    Hypertension Brother    Diabetes Brother    Other Father        "natural causes"    Social History Social History   Tobacco Use   Smoking status: Never   Smokeless tobacco: Never  Vaping Use   Vaping Use: Never used  Substance Use Topics   Alcohol use: Not Currently    Alcohol/week: 0.0 standard drinks   Drug use: No    Review of Systems   Review of Systems  Constitutional:  Negative for chills and fever.  Eyes:  Negative for visual disturbance.  Respiratory:  Negative for shortness of breath.   Cardiovascular:  Negative for chest pain and leg swelling.  Gastrointestinal:  Negative for abdominal pain, nausea and vomiting.  Neurological:  Positive for headaches. Negative for weakness, light-headedness and  numbness.  All other systems reviewed and are negative.  Physical Exam Updated Vital Signs BP (!) 164/66   Pulse (!) 59   Temp 98.2 F (36.8 C) (Oral)   Resp 16   Ht '5\' 6"'$  (1.676 m)   Wt 86.6 kg   SpO2 98%   BMI 30.83 kg/m   Physical Exam   LABS (all labs ordered are listed, but only abnormal results are displayed)  Labs Reviewed  COMPREHENSIVE METABOLIC PANEL - Abnormal; Notable for the following components:      Result Value   Potassium 3.4 (*)    Glucose, Bld 107 (*)    Creatinine, Ser 1.27 (*)    Calcium 8.8 (*)    GFR, Estimated 43 (*)    All other components within normal limits  CBC   ____________________________________________  EKG  N/a ____________________________________________  RADIOLOGY Almeta Monas, personally viewed and  evaluated these images (plain radiographs) as part of my medical decision making, as well as reviewing the written report by the radiologist.  ED MD interpretation:  n/a    ____________________________________________   PROCEDURES  Procedure(s) performed (including Critical Care):  Procedures   ____________________________________________   INITIAL IMPRESSION / ASSESSMENT AND PLAN / ED COURSE     80 year old female who presents with asymptomatic hypertension.  Blood pressure was elevated at her PCP appointment which is why she presents today.  Her only symptom is a mild left-sided headache without any visual change or neurologic symptoms.  She is very well-appearing on my exam and her neurologic exam is completely normal.  Labs were obtained in triage, creatinine mildly elevated from baseline, but otherwise within normal limits.  She has no other symptoms of hypertensive emergency.  Wanted to speak with Dr. Clayborn Bigness directly and I let her know that this was not indicated at this time.  I did message him to let him know that she was in the ED.  I advised that she check her blood pressure daily and follow-up with  whoever is prescribing her antihypertensives, Dr. Clayborn Bigness or her primary care provider as she may need to have her medications adjusted.      ____________________________________________   FINAL CLINICAL IMPRESSION(S) / ED DIAGNOSES  Final diagnoses:  Hypertension, unspecified type     ED Discharge Orders     None        Note:  This document was prepared using Dragon voice recognition software and may include unintentional dictation errors.    Rada Hay, MD 01/16/21 [phone]

## 2021-01-28 ENCOUNTER — Encounter (INDEPENDENT_AMBULATORY_CARE_PROVIDER_SITE_OTHER): Payer: Self-pay

## 2021-01-28 ENCOUNTER — Ambulatory Visit (INDEPENDENT_AMBULATORY_CARE_PROVIDER_SITE_OTHER): Payer: Medicare Other | Admitting: Podiatry

## 2021-01-28 ENCOUNTER — Other Ambulatory Visit: Payer: Self-pay

## 2021-01-28 ENCOUNTER — Encounter: Payer: Self-pay | Admitting: Podiatry

## 2021-01-28 DIAGNOSIS — M2041 Other hammer toe(s) (acquired), right foot: Secondary | ICD-10-CM

## 2021-01-28 DIAGNOSIS — E119 Type 2 diabetes mellitus without complications: Secondary | ICD-10-CM

## 2021-01-28 DIAGNOSIS — M2042 Other hammer toe(s) (acquired), left foot: Secondary | ICD-10-CM | POA: Diagnosis not present

## 2021-01-28 NOTE — Progress Notes (Signed)
Subjective: Mallory Arellano presents today referred by Sharyne Peach, MD for diabetic foot evaluation.  Patient relates 2016 year history of diabetes.  Patient denies any history of foot wounds.  Patient denies any history of numbness, tingling, burning, pins/needles sensations.  Past Medical History:  Diagnosis Date   A-fib ([place])    Arthritis    Asthma    Diabetes mellitus without complication ([place])    Dysrhythmia    GERD (gastroesophageal reflux disease)    Hypercholesteremia    Hypertension    Lower extremity edema    Palpitation    UTI (urinary tract infection)     Patient Active Problem List   Diagnosis Date Noted   Primary localized osteoarthritis of right hip 11/11/2017   Symptomatic sinus bradycardia 02/12/2017   Chronic cough 06/18/2015   Plantar fascial fibromatosis 06/18/2015   Preventative health care 01/18/2015   Paroxysmal atrial fibrillation ([place]) 12/30/2014   Type 2 diabetes mellitus without complication ([place]) 0000000   Hypertension, essential, benign 10/16/2014   Mild persistent chronic asthma without complication 0000000   Allergic rhinitis 10/16/2014   GERD (gastroesophageal reflux disease) 10/16/2014   HLD (hyperlipidemia)    Arthritis of knee, degenerative 09/07/2014    Past Surgical History:  Procedure Laterality Date   CATARACT EXTRACTION, BILATERAL     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     Bromfenac Sodium (PROLENSA) 0.07 % SOLN Place 1 drop into the left eye at bedtime.     budesonide-formoterol (SYMBICORT) 160-4.5 MCG/ACT inhaler Inhale 2 puffs into the lungs 2 (two) times  daily.     diclofenac Sodium (VOLTAREN) 1 % GEL Apply 2 g topically 2 (two) times daily.     Difluprednate (DUREZOL) 0.05 % EMUL Place 1 drop into the left eye 3 (three) times daily.     ezetimibe (ZETIA) 10 MG tablet Take 1 tablet (10 mg total) by mouth daily. 90 tablet 3   flecainide (TAMBOCOR) 50 MG tablet Take 1 tablet (50 mg total) by mouth every 12 (twelve) hours. 60 tablet 0   fluticasone (FLONASE) 50 MCG/ACT nasal spray Place 2 sprays into both nostrils daily.     furosemide (LASIX) 20 MG tablet Take 1 tablet (20 mg total) by mouth daily as needed for edema.     glimepiride (AMARYL) 4 MG tablet Take 4 mg by mouth daily with breakfast.     hydrochlorothiazide (HYDRODIURIL) 25 MG tablet Take 25 mg by mouth daily.     hydrOXYzine (ATARAX/VISTARIL) 25 MG tablet Take 25 mg by mouth 3 (three) times daily as needed for itching.      levalbuterol (XOPENEX) 1.25 MG/3ML nebulizer solution Take 1.25 mg by nebulization every 6 (six) hours as needed for wheezing.     metoprolol succinate (TOPROL-XL) 25 MG 24 hr tablet Take 0.5 tablets (12.5 mg total) by mouth daily. Take with or immediately following a meal. 46 tablet 3   ondansetron (ZOFRAN) 4 MG tablet Take 1 tablet (4 mg total) by mouth every 8 (eight) hours as needed for nausea or vomiting. 15 tablet 0   pantoprazole (PROTONIX) 40 MG tablet Take 40 mg  by mouth 2 (two) times daily.   11   predniSONE (STERAPRED UNI-PAK 21 TAB) 10 MG (21) TBPK tablet Take as directed 21 tablet 0   rivaroxaban (XARELTO) 20 MG TABS tablet Take 20 mg by mouth daily with breakfast.     SUMAtriptan (IMITREX) 25 MG tablet Take 1 tablet by mouth every 2 (two) hours as needed for migraine.      Vitamin D, Ergocalciferol, (DRISDOL) 1.25 MG (50000 UNIT) CAPS capsule Take 50,000 Units by mouth every Monday.     No current facility-administered medications on file prior to visit.     Allergies  Allergen Reactions   Atorvastatin     Other reaction(s): Muscle Pain   Losartan  Cough   Sitagliptin Swelling    ABDOMINAL SWELLING   Metformin And Related Other (See Comments)    Stomach swelling   Warfarin Sodium Other (See Comments)    Patient didn't know what happened but states she couldn't take it   Penicillins Itching    Has patient had a PCN reaction causing immediate rash, facial/tongue/throat swelling, SOB or lightheadedness with hypotension: No Has patient had a PCN reaction causing severe rash involving mucus membranes or skin necrosis: No Has patient had a PCN reaction that required hospitalization: No Has patient had a PCN reaction occurring within the last 10 years: No If all of the above answers are "NO", then may proceed with Cephalosporin use.     Social History   Occupational History   Not on file  Tobacco Use   Smoking status: Never   Smokeless tobacco: Never  Vaping Use   Vaping Use: Never used  Substance and Sexual Activity   Alcohol use: Not Currently    Alcohol/week: 0.0 standard drinks   Drug use: No   Sexual activity: Not Currently    Partners: Male    Birth control/protection: Post-menopausal    Family History  Problem Relation Age of Onset   Diabetes Mother    Hypertension Mother    Hypertension Brother    Diabetes Brother    Other Father        "natural causes"    Immunization History  Administered Date(s) Administered   Influenza, High Dose Seasonal PF 02/13/2017   PFIZER Comirnaty(Gray Top)Covid-19 Tri-Sucrose Vaccine 10/11/2019, 11/01/2019, 05/08/2020, 10/30/2020   Pneumococcal Conjugate-13 01/18/2015    Review of systems: Positive Findings in bold print.  Constitutional:  chills, fatigue, fever, sweats, weight change Communication: Optometrist, sign Ecologist, hand writing, iPad/Android device Head: headaches, head injury Eyes: changes in vision, eye pain, glaucoma, cataracts, macular degeneration, diplopia, glare,  light sensitivity, eyeglasses or contacts, blindness Ears nose mouth throat:  hearing impaired, hearing aids,  ringing in ears, deaf, sign language,  vertigo, nosebleeds,  rhinitis,  cold sores, snoring, swollen glands Cardiovascular: HTN, edema, arrhythmia, pacemaker in place, defibrillator in place, chest pain/tightness, chronic anticoagulation, blood clot, heart failure, MI Peripheral Vascular: leg cramps, varicose veins, blood clots, lymphedema, varicosities Respiratory:  asthma, difficulty breathing, denies congestion, SOB, wheezing, cough, emphysema Gastrointestinal: change in appetite or weight, abdominal pain, constipation, diarrhea, nausea, vomiting, vomiting blood, change in bowel habits, abdominal pain, jaundice, rectal bleeding, hemorrhoids, GERD Genitourinary:  nocturia,  pain on urination, polyuria,  blood in urine, Foley catheter, urinary urgency, ESRD on hemodialysis Musculoskeletal: amputation, cramping, stiff joints, painful joints, decreased joint motion, fractures, OA, gout, hemiplegia, paraplegia, uses cane, wheelchair bound, uses walker, uses rollator Skin: +changes in toenails, color change, dryness, itching, mole changes,  rash, wound(s) Neurological: headaches, numbness  in feet, paresthesias in feet, burning in feet, fainting,  seizures, change in speech, migraines, memory problems/poor historian, cerebral palsy, weakness, paralysis, CVA, TIA Endocrine: diabetes, hypothyroidism, hyperthyroidism,  goiter, dry mouth, flushing, heat intolerance, cold intolerance,  excessive thirst, denies polyuria,  nocturia Hematological:  easy bleeding, excessive bleeding, easy bruising, enlarged lymph nodes, on long term blood thinner, history of past transusions Allergy/immunological:  hives, eczema, frequent infections, multiple drug allergies, seasonal allergies, transplant recipient, multiple food allergies Psychiatric:  anxiety, depression, mood disorder, suicidal ideations, hallucinations, insomnia  Objective: There were no vitals filed for this visit. Vascular  Examination: Capillary refill time less than 3 seconds x 10 digits.  Dorsalis pedis pulses palpable 2 out of 4.  Posterior tibial pulses palpable 2 out of 4.  Digital hair not present x 10 digits.  Skin temperature gradient WNL b/l.  Dermatological Examination: Skin with normal turgor, texture and tone b/l  Toenails 1-5 b/l discolored, thick, dystrophic with subungual debris and pain with palpation to nailbeds due to thickness of nails.  Musculoskeletal: Muscle strength 5/5 to all LE muscle groups..  Hammertoe contractures noted of digits 2 through 5 bilaterally  Neurological: Sensation intact with 10 gram monofilament.  Vibratory sensation intact.  Assessment: NIDDM Encounter for diabetic foot examination  Plan: Discussed diabetic foot care principles. Literature dispensed on today. Patient to continue soft, supportive shoe gear daily. Patient to report any pedal injuries to medical professional immediately. Follow up one year. Patient/POA to call should there be a concern in the interim. Patient will benefit from diabetic shoes given that her last A1c of 7% with underlying hammertoe contractures she will be scheduled to see nursing for diabetic shoes

## 2021-01-29 IMAGING — CR DG CHEST 2V
1 series · 2 of 2 positions shown · non-contrast
Comparison: 02/14/2017

CLINICAL DATA: Chest pain

EXAM:
CHEST - 2 VIEW

[Series 1: w chest pa · 0.14mm/px · 2 of 2 slices shown]
[im 1/2]
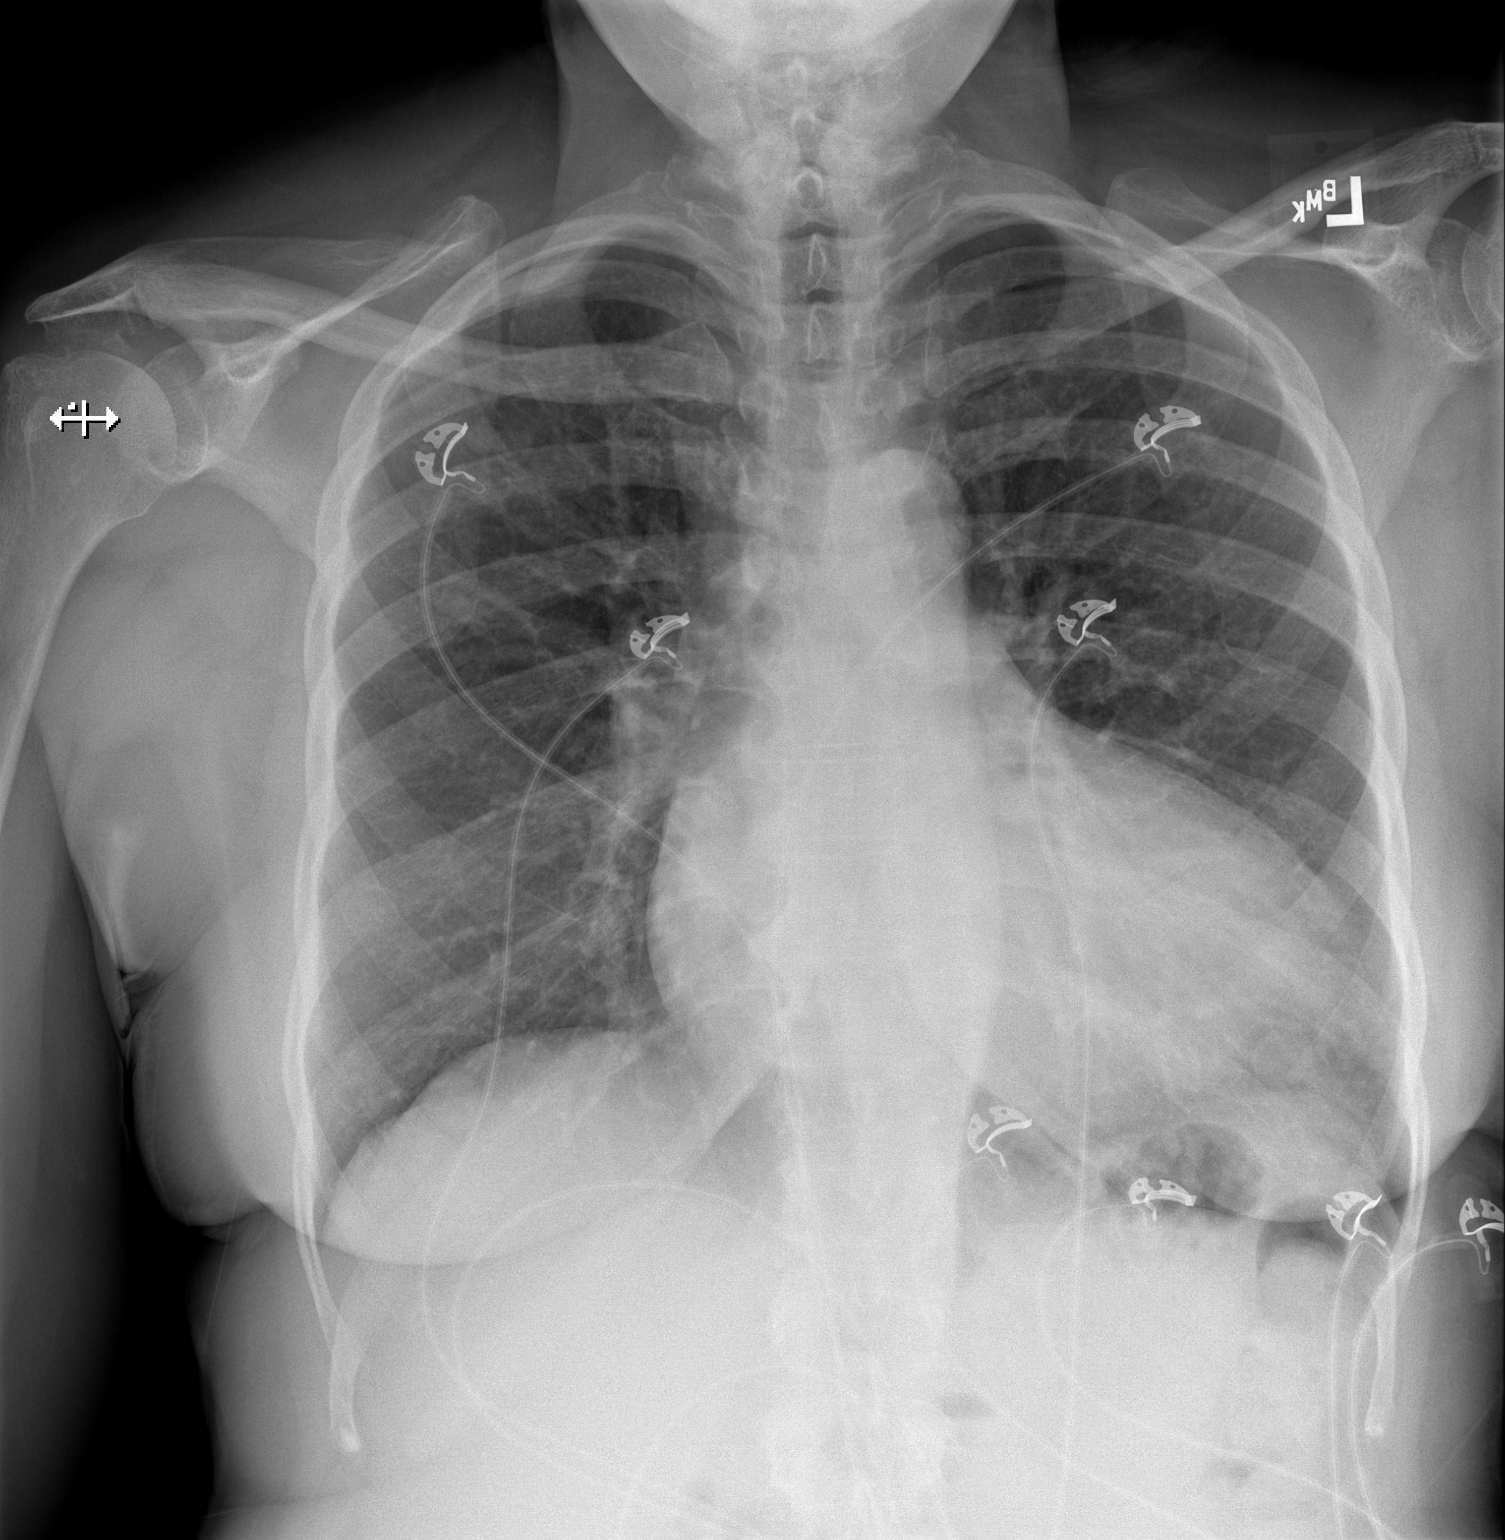
[im 2/2]
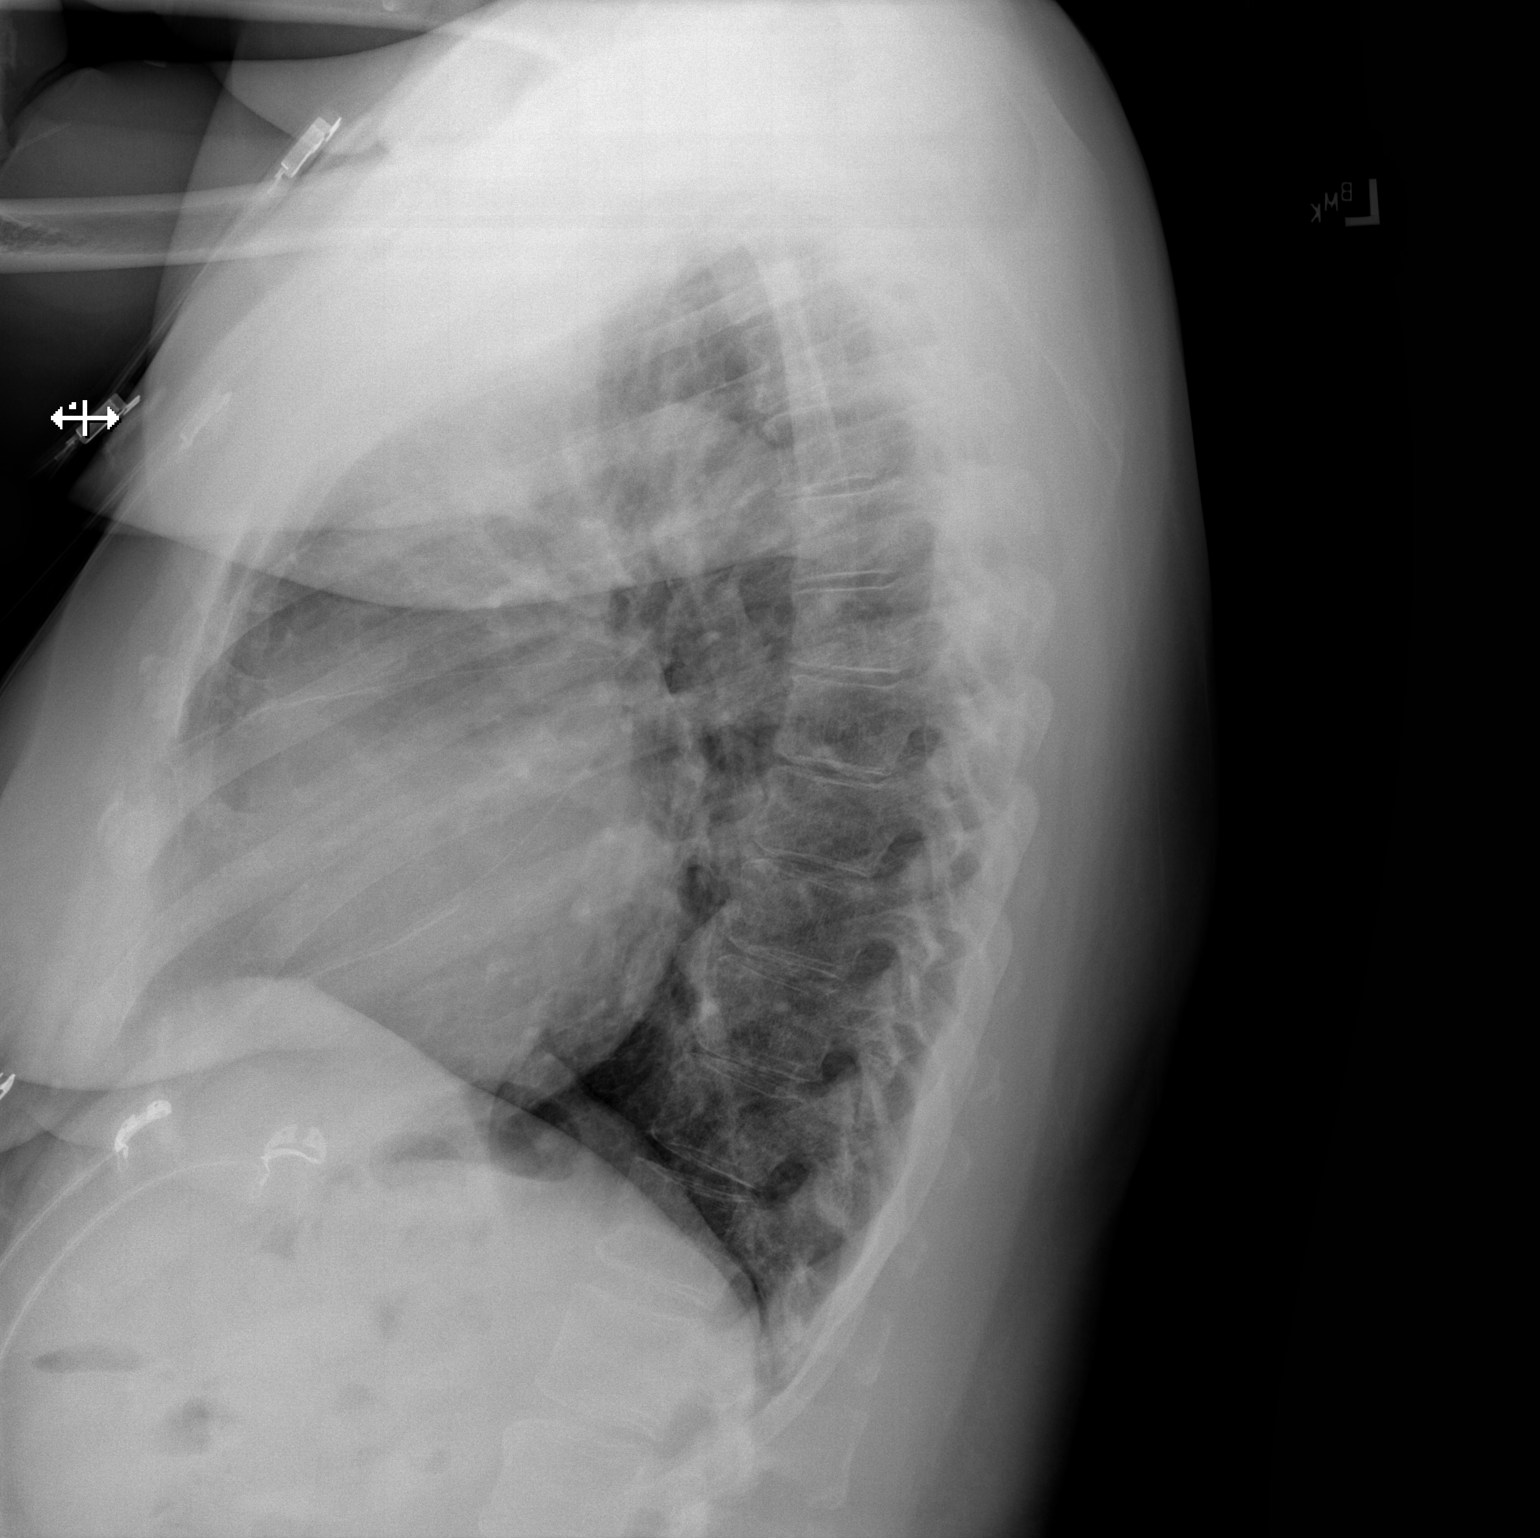

[2 of 2 positions shown; findings below may reference images not displayed]

FINDINGS: Cardiomegaly. Lungs clear. No effusions or edema. No acute bony
abnormality.
IMPRESSION: Cardiomegaly.  No active disease.

## 2021-01-31 ENCOUNTER — Other Ambulatory Visit: Payer: Self-pay

## 2021-01-31 ENCOUNTER — Ambulatory Visit (INDEPENDENT_AMBULATORY_CARE_PROVIDER_SITE_OTHER): Payer: Medicare Other

## 2021-01-31 ENCOUNTER — Encounter (INDEPENDENT_AMBULATORY_CARE_PROVIDER_SITE_OTHER): Payer: Self-pay

## 2021-01-31 DIAGNOSIS — M2042 Other hammer toe(s) (acquired), left foot: Secondary | ICD-10-CM

## 2021-01-31 DIAGNOSIS — M2041 Other hammer toe(s) (acquired), right foot: Secondary | ICD-10-CM

## 2021-01-31 DIAGNOSIS — E119 Type 2 diabetes mellitus without complications: Secondary | ICD-10-CM

## 2021-01-31 NOTE — Progress Notes (Signed)
Patient presents to the office today for diabetic shoe and insole measuring.  Patient was measured with brannock device to determine size and width for 1 pair of extra depth shoes and foam casted for 3 pair of insoles.   Documentation of medical necessity will be sent to patient's treating diabetic doctor to verify and sign.   Patient's diabetic provider: Dr. Dawayne Patricia and insoles will be ordered at that time and patient will be notified for an appointment for fitting when they arrive.   Shoe size (per patient): 10.5 Wide   Brannock measurement: RT:10.5 wide   LT:9.5 wide  Patient shoe selection-   1st choice:   Orthofeet [place] [id number] (black)  2nd choice:  [phone]- [id number]  Shoe size ordered: 10.5 Wide

## 2021-04-08 ENCOUNTER — Other Ambulatory Visit: Payer: Self-pay | Admitting: Internal Medicine

## 2021-04-08 DIAGNOSIS — I208 Other forms of angina pectoris: Secondary | ICD-10-CM

## 2021-04-18 ENCOUNTER — Telehealth (HOSPITAL_COMMUNITY): Payer: Self-pay | Admitting: Emergency Medicine

## 2021-04-18 NOTE — Telephone Encounter (Signed)
Uanble to leave vm Marchia Bond RN Navigator Cardiac Imaging [facility] [phone] Office  [phone] Cell

## 2021-04-21 ENCOUNTER — Ambulatory Visit: Admission: RE | Admit: 2021-04-21 | Payer: Medicare Other | Source: Ambulatory Visit

## 2021-04-29 ENCOUNTER — Ambulatory Visit: Payer: Medicare Other | Admitting: Podiatry

## 2021-04-29 ENCOUNTER — Telehealth (HOSPITAL_COMMUNITY): Payer: Self-pay | Admitting: Emergency Medicine

## 2021-04-29 NOTE — Telephone Encounter (Signed)
Unable to leave vm.

## 2021-05-01 ENCOUNTER — Ambulatory Visit: Admission: RE | Admit: 2021-05-01 | Payer: Medicare Other | Source: Ambulatory Visit

## 2021-05-27 ENCOUNTER — Telehealth: Payer: Self-pay | Admitting: Podiatry

## 2021-05-27 NOTE — Telephone Encounter (Signed)
Pt called back and stated she was just seen by pcp and I told her I would refax the documents to get signed.

## 2021-05-27 NOTE — Telephone Encounter (Signed)
Called cell number and not able to leave a message as no voicemail is set up..  Called home number and left message that we got the diabetic shoes in but the authorization has expired. I asked for a call back to let me know if pt has seen pcp since 6.8.2022 if not they need to get an appt scheduled so we can get updated diabetic shoe paperwork.

## 2021-06-04 ENCOUNTER — Other Ambulatory Visit: Payer: Self-pay

## 2021-06-04 ENCOUNTER — Emergency Department
Admission: EM | Admit: 2021-06-04 | Discharge: 2021-06-04 | Disposition: A | Discharge status: Home / Self Care | Payer: Medicare HMO | Attending: Emergency Medicine | Admitting: Emergency Medicine

## 2021-06-04 ENCOUNTER — Emergency Department: Payer: Medicare HMO

## 2021-06-04 ENCOUNTER — Telehealth (HOSPITAL_COMMUNITY): Payer: Self-pay | Admitting: Emergency Medicine

## 2021-06-04 DIAGNOSIS — I10 Essential (primary) hypertension: Secondary | ICD-10-CM | POA: Diagnosis not present

## 2021-06-04 DIAGNOSIS — E119 Type 2 diabetes mellitus without complications: Secondary | ICD-10-CM | POA: Insufficient documentation

## 2021-06-04 DIAGNOSIS — Z20822 Contact with and (suspected) exposure to covid-19: Secondary | ICD-10-CM | POA: Diagnosis not present

## 2021-06-04 DIAGNOSIS — J4531 Mild persistent asthma with (acute) exacerbation: Secondary | ICD-10-CM | POA: Diagnosis not present

## 2021-06-04 DIAGNOSIS — R0602 Shortness of breath: Secondary | ICD-10-CM | POA: Diagnosis present

## 2021-06-04 LAB — CBC
HCT: 39.2 % (ref 36.0–46.0)
Hemoglobin: 13.1 g/dL (ref 12.0–15.0)
MCH: 29.3 pg (ref 26.0–34.0)
MCHC: 33.4 g/dL (ref 30.0–36.0)
MCV: 87.7 fL (ref 80.0–100.0)
Platelets: 299 10*3/uL (ref 150–400)
RBC: 4.47 MIL/uL (ref 3.87–5.11)
RDW: 12.8 % (ref 11.5–15.5)
WBC: 8.4 10*3/uL (ref 4.0–10.5)
nRBC: 0 % (ref 0.0–0.2)

## 2021-06-04 LAB — BASIC METABOLIC PANEL
Anion gap: 8 (ref 5–15)
BUN: 14 mg/dL (ref 8–23)
CO2: 27 mmol/L (ref 22–32)
Calcium: 9 mg/dL (ref 8.9–10.3)
Chloride: 102 mmol/L (ref 98–111)
Creatinine, Ser: 0.91 mg/dL (ref 0.44–1.00)
GFR, Estimated: >60 mL/min (ref >60)
Glucose, Bld: 281 mg/dL — ABNORMAL HIGH (ref 70–99)
Potassium: 3.7 mmol/L (ref 3.5–5.1)
Sodium: 137 mmol/L (ref 135–145)

## 2021-06-04 LAB — RESP PANEL BY RT-PCR (FLU A&B, COVID) ARPGX2
Influenza A by PCR: NEGATIVE
Influenza B by PCR: NEGATIVE
SARS Coronavirus 2 by RT PCR: NEGATIVE

## 2021-06-04 MED ORDER — IPRATROPIUM-ALBUTEROL 0.5-2.5 (3) MG/3ML IN SOLN
3.0000 mL | Freq: Once | RESPIRATORY_TRACT | Status: AC
  Administered 2021-06-04: 3 mL via RESPIRATORY_TRACT
  Filled 2021-06-04: qty 3

## 2021-06-04 MED ORDER — PREDNISONE 20 MG PO TABS
60.0000 mg | ORAL_TABLET | Freq: Once | ORAL | Status: AC
  Administered 2021-06-04: 60 mg via ORAL
  Filled 2021-06-04: qty 3

## 2021-06-04 MED ORDER — PREDNISONE 10 MG PO TABS: 20.0000 mg | ORAL_TABLET | Freq: Every day | ORAL | 0 refills | Status: AC

## 2021-06-04 MED ORDER — IPRATROPIUM-ALBUTEROL 0.5-2.5 (3) MG/3ML IN SOLN
9.0000 mL | Freq: Once | RESPIRATORY_TRACT | Status: AC
  Administered 2021-06-04: 9 mL via RESPIRATORY_TRACT
  Filled 2021-06-04: qty 9

## 2021-06-04 NOTE — Discharge Instructions (Addendum)
Please take the 20 mg prednisone for the next 5 days.  Please check your blood sugar daily.  Please use the Symbicort as instructed 2 puffs twice a day and use the Xopenex for rescue.  If your breathing is worsening, please return to the emergency department.

## 2021-06-04 NOTE — Telephone Encounter (Signed)
Calling patient to review CCTA instructions when she informed me that she had a catheterization a few weeks ago at a different location. Was told she had clean coronaries. Wanted to confirm if she indeed needed the CCTA now. Consulted with Dr. Clayborn Bigness who states we can cancel the CCTA.   Attempted to return call to patient who was heading out the door for another appt and had to leave a vm informing her that her CCTA tomorrow has now been cancelled. I want her to return my call to confirmation shes received the message.  Marchia Bond RN Navigator Cardiac Imaging [facility] [phone] Office  [phone] Cell

## 2021-06-04 NOTE — ED Notes (Signed)
First RN note:  Pt comes into the ED c/o SHOB.  Pt currently has even and unlabored respirations and is able to speak in full sentences.

## 2021-06-04 NOTE — ED Provider Triage Note (Signed)
Emergency Medicine Provider Triage Evaluation Note  Shalana TEDDIE CURD , a 81 y.o. female  was evaluated in triage.  Pt complains of shortness of breath, asthma exasperation, recent hospitalization was discharged about a week ago in [place].  Patient lives here.  States symptoms have worsened since she arrived home.  No vomiting or diarrhea.  Used albuterol without any relief.  Review of Systems  Positive: Shortness of breath, weakness Negative: Fever, chills  Physical Exam  BP (!) 149/82    Pulse 73    Resp 20    Ht 5\' 6"  (1.676 m)    Wt 90.7 kg    SpO2 96%    BMI 32.28 kg/m  Gen:   Awake, no distress   Resp:  Increased respiratory effort, wheezing noted throughout all lung fields MSK:   Moves extremities without difficulty  Other:    Medical Decision Making  Medically screening exam initiated at 1:52 PM.  Appropriate orders placed.  Arelene A Signor was informed that the remainder of the evaluation will be completed by another provider, this initial triage assessment does not replace that evaluation, and the importance of remaining in the ED until their evaluation is complete.  DuoNeb ordered   Versie Starks, PA-C 06/04/21 1353

## 2021-06-04 NOTE — ED Provider Notes (Signed)
[facility] Provider Note    Event Date/Time   First MD Initiated Contact with Patient 06/04/21 1554     (approximate)   History   Shortness of Breath   HPI  Mallory Arellano is a 81 y.o. female with past medical history of asthma, hypertension, hyperlipidemia, atrial fibrillation, diabetes who presents with shortness of breath.  She notes that she has had difficulty with shortness of breath over the last several months when she was recently diagnosed with asthma earlier this year.  Symptoms have been worse over the last about a week and then worse over the last 3 days to the point she is having difficulty sleeping last night.  She endorses cough productive of green sputum as well as dyspnea on exertion.  Denies associated chest pain fevers chills nausea vomiting.  Patient has been using the albuterol inhaler but has not been using the Symbicort.  He has chronic lower extremity edema which is symmetric and unchanged.  Reviewed patient's last pulmonary outpatient visit which was on 1/3 with Dr. Raul Del, patient had mild exacerbation of asthma at that point and was prescribed 5 mg of prednisone daily and encouraged to use the Symbicort and levalbuterol.    Past Medical History:  Diagnosis Date   A-fib ([place])    Arthritis    Asthma    Diabetes mellitus without complication ([place])    Dysrhythmia    GERD (gastroesophageal reflux disease)    Hypercholesteremia    Hypertension    Lower extremity edema    Palpitation    UTI (urinary tract infection)     Patient Active Problem List   Diagnosis Date Noted   Primary localized osteoarthritis of right hip 11/11/2017   Symptomatic sinus bradycardia 02/12/2017   Chronic cough 06/18/2015   Plantar fascial fibromatosis 06/18/2015   Preventative health care 01/18/2015   Paroxysmal atrial fibrillation ([place]) 12/30/2014   Type 2 diabetes mellitus without complication ([place]) 93/79/0240   Hypertension, essential, benign  10/16/2014   Mild persistent chronic asthma without complication 97/35/3299   Allergic rhinitis 10/16/2014   GERD (gastroesophageal reflux disease) 10/16/2014   HLD (hyperlipidemia)    Arthritis of knee, degenerative 09/07/2014     Physical Exam  Triage Vital Signs: ED Triage Vitals  Enc Vitals Group     BP 06/04/21 1348 (!) 149/82     Pulse Rate 06/04/21 1348 73     Resp 06/04/21 1348 20     Temp 06/04/21 1352 98 F (36.7 C)     Temp Source 06/04/21 1352 Oral     SpO2 06/04/21 1348 96 %     Weight 06/04/21 1349 200 lb (90.7 kg)     Height 06/04/21 1349 5\' 6"  (1.676 m)     Head Circumference --      Peak Flow --      Pain Score 06/04/21 1349 0     Pain Loc --      Pain Edu? --      Excl. in [place]? --     Most recent vital signs: Vitals:   06/04/21 1348 06/04/21 1352  BP: (!) 149/82   Pulse: 73   Resp: 20   Temp:  98 F (36.7 C)  SpO2: 96%      General: Awake, no distress. CV:  Good peripheral perfusion.  1+ lower extremity edema bilaterally Resp:  Normal effort.  No increased work of breathing, speaks in full sentences without issue, expiratory wheezing with good air movement Abd:  No distention.  Neuro:             Awake, Alert, Oriented x 3  Other:     ED Results / Procedures / Treatments  Labs (all labs ordered are listed, but only abnormal results are displayed) Labs Reviewed  BASIC METABOLIC PANEL - Abnormal; Notable for the following components:      Result Value   Glucose, Bld 281 (*)    All other components within normal limits  RESP PANEL BY RT-PCR (FLU A&B, COVID) ARPGX2  CBC     EKG EKG interpreted by myself, left axis deviation, sinus rhythm with PACs no acute ischemic changes similar to prior    RADIOLOGY Chest x-ray reviewed by myself, cardiomegaly, no infiltrate or pulmonary edema, agree with radiology report   PROCEDURES:  Critical Care performed: No    MEDICATIONS ORDERED IN ED: Medications  ipratropium-albuterol (DUONEB)  0.5-2.5 (3) MG/3ML nebulizer solution 9 mL (9 mLs Nebulization Given 06/04/21 1404)  ipratropium-albuterol (DUONEB) 0.5-2.5 (3) MG/3ML nebulizer solution 3 mL (3 mLs Nebulization Given 06/04/21 1637)  ipratropium-albuterol (DUONEB) 0.5-2.5 (3) MG/3ML nebulizer solution 3 mL (3 mLs Nebulization Given 06/04/21 1637)  predniSONE (DELTASONE) tablet 60 mg (60 mg Oral Given 06/04/21 1638)     IMPRESSION / MDM / ASSESSMENT AND PLAN / ED COURSE  I reviewed the triage vital signs and the nursing notes.                              Differential diagnosis includes, but is not limited to, pneumonia, asthma exacerbation, CHF  Patient is an 81 year old female recently diagnosed with asthma who presents with shortness of breath.  Was seen as an outpatient about a week ago for exacerbation was started on 5 mg of prednisone.  Patient presents today with worsening symptoms over the last 3 days.  She received an albuterol inhaler treatment in triage and by the time of evaluation feels improved.  Her vital signs within normal limits she is not hypoxic.  She appears well on exam has no increased work of breathing able to speak in full sentences.  Does have mild expiratory wheezing with good air movement.  She does also have bilateral pitting edema but notes that this is not new for her.  Reviewed her chest x-ray which does not show any infiltrate.  Labs are notable for mild hyperglycemia sugar around 280 but otherwise reassuring.  She has no chest pain her EKG does not have any ischemic changes.  Suspect ongoing exacerbation of her asthma.  Patient is not taking her controller medications appropriately, she did not understand that the Symbicort was for daily use and Xopenex only for rescue.  We had a long discussion about which medications to take on a daily basis including importance of taking the Symbicort.  We will start her on a higher dose of prednisone however with her sugar already being elevated will only go up to 20  mg for the next 5 days.  Encouraged follow-up with her primary pulmonologist and return to ED if symptoms are worsening.   FINAL CLINICAL IMPRESSION(S) / ED DIAGNOSES   Final diagnoses:  Mild persistent asthma with exacerbation     Rx / DC Orders   ED Discharge Orders          Ordered    predniSONE (DELTASONE) 10 MG tablet  Daily        06/04/21 1654  Note:  This document was prepared using Dragon voice recognition software and may include unintentional dictation errors.   Rada Hay, MD 06/04/21 [phone]

## 2021-06-04 NOTE — ED Triage Notes (Signed)
Pt to ED for asthma, increased shob, expiratory wheezing. States has been to doctor and told had bronchial infection.  Able to speak in complete sentences, RR unlabored. Wheezes and cough noted.

## 2021-06-05 ENCOUNTER — Ambulatory Visit: Admission: RE | Admit: 2021-06-05 | Payer: Medicare HMO | Source: Ambulatory Visit

## 2021-07-11 ENCOUNTER — Ambulatory Visit: Admission: RE | Admit: 2021-07-11 | Payer: Medicare HMO | Source: Home / Self Care

## 2021-07-11 ENCOUNTER — Encounter: Admission: RE | Payer: Self-pay | Source: Home / Self Care

## 2021-07-11 SURGERY — ESOPHAGOGASTRODUODENOSCOPY (EGD) WITH PROPOFOL
Anesthesia: General

## 2021-08-08 ENCOUNTER — Other Ambulatory Visit: Payer: Self-pay | Admitting: Cardiovascular Disease

## 2021-09-11 ENCOUNTER — Encounter: Payer: Self-pay | Admitting: "Endocrinology

## 2021-09-11 ENCOUNTER — Ambulatory Visit (INDEPENDENT_AMBULATORY_CARE_PROVIDER_SITE_OTHER): Payer: Medicare HMO | Admitting: "Endocrinology

## 2021-09-11 VITALS — BP 124/56 | HR 64 | Ht 66.0 in | Wt 198.4 lb

## 2021-09-11 DIAGNOSIS — E1169 Type 2 diabetes mellitus with other specified complication: Secondary | ICD-10-CM | POA: Diagnosis not present

## 2021-09-11 DIAGNOSIS — E782 Mixed hyperlipidemia: Secondary | ICD-10-CM | POA: Diagnosis not present

## 2021-09-11 DIAGNOSIS — I10 Essential (primary) hypertension: Secondary | ICD-10-CM | POA: Diagnosis not present

## 2021-09-11 DIAGNOSIS — E6609 Other obesity due to excess calories: Secondary | ICD-10-CM | POA: Insufficient documentation

## 2021-09-11 DIAGNOSIS — Z6832 Body mass index (BMI) 32.0-32.9, adult: Secondary | ICD-10-CM

## 2021-09-11 LAB — POCT GLYCOSYLATED HEMOGLOBIN (HGB A1C): HbA1c, POC (controlled diabetic range): 9.4 % — AB (ref 0.0–7.0)

## 2021-09-11 MED ORDER — GLIPIZIDE ER 5 MG PO TB24
5.0000 mg | ORAL_TABLET | Freq: Every day | ORAL | 1 refills | Status: DC
Start: 2021-09-11 — End: 2022-03-09

## 2021-09-11 NOTE — Patient Instructions (Signed)

## 2021-09-11 NOTE — Progress Notes (Signed)
? ?                                                             Endocrinology Consult Note  ?     09/11/2021, 1:51 PM ? ? ?Subjective:  ? ? Patient ID: Mallory Arellano, female    DOB: 05/17/1941.  ?Mallory Arellano is being seen in consultation for management of currently uncontrolled symptomatic diabetes requested by  [place]. ? ? ?Past Medical History:  ?Diagnosis Date  ? A-fib ([place])   ? Arthritis   ? Asthma   ? Back pain   ? Diabetes mellitus without complication ([place])   ? Diabetes mellitus, type II ([place])   ? Dysrhythmia   ? Ganglion cyst of foot   ? GERD (gastroesophageal reflux disease)   ? Hypercholesteremia   ? Hypertension   ? Lower extremity edema   ? Lumbar radiculitis   ? Osteoarthritis   ? Palpitation   ? Perioral dermatitis   ? UTI (urinary tract infection)   ? ? ?Past Surgical History:  ?Procedure Laterality Date  ? CATARACT EXTRACTION, BILATERAL    ? COLONOSCOPY    ? 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 Procedure: TOTAL HIP ARTHROPLASTY ANTERIOR APPROACH;  Surgeon: Hessie Knows, MD;  Location: [facility] ORS;  Service: Orthopedics;  Laterality: Right;  ? WISDOM TOOTH EXTRACTION    ? ? ?Social History  ? ?Socioeconomic History  ? Marital status: Widowed  ?  Spouse name: Not on file  ? Number of children: Not on file  ? Years of education: Not on file  ? Highest education level: Not on file  ?Occupational History  ? Not on file  ?Tobacco Use  ? Smoking status: Never  ? Smokeless tobacco: Never  ?Vaping Use  ? Vaping Use: Never used  ?Substance and Sexual Activity  ? Alcohol use: Not Currently  ?  Alcohol/week: 0.0 standard drinks  ? Drug use: No  ? Sexual activity: Not Currently  ?  Partners: Male  ?  Birth control/protection: Post-menopausal  ?Other Topics Concern  ? Not on file  ?Social History Narrative  ? Not on file  ? ?Social Determinants of Health  ? ?Financial Resource Strain: Not on file  ?Food Insecurity: Not on file  ?Transportation Needs: Not on file  ?Physical  Activity: Not on file  ?Stress: Not on file  ?Social Connections: Not on file  ? ? ?Family History  ?Problem Relation Age of Onset  ? Diabetes Mother   ? Hypertension Mother   ? Hypertension Brother   ? Diabetes Brother   ? Other Father   ?     "natural causes"  ? ? ?Outpatient Encounter Medications as of 09/11/2021  ?Medication Sig  ? glipiZIDE (GLUCOTROL XL) 5 MG 24 hr tablet Take 1 tablet (5 mg total) by mouth daily with breakfast.  ? budesonide-formoterol (SYMBICORT) 160-4.5 MCG/ACT inhaler Inhale 2 puffs into the lungs 2 (two) times daily.  ? chlorthalidone (HYGROTON) 25 MG tablet Take 25 mg by mouth daily.  ? diclofenac Sodium (VOLTAREN) 1 % GEL Apply 2 g topically 2 (two) times daily.  ? ezetimibe (ZETIA) 10 MG tablet Take 1 tablet (10 mg total) by mouth daily. PLEASE SCHEDULE OFFICE VISIT FOR FURTHER REFILLS. THANK YOU!  ? fexofenadine (  ALLEGRA) 180 MG tablet Take 180 mg by mouth daily.  ? flecainide (TAMBOCOR) 50 MG tablet Take 1 tablet (50 mg total) by mouth every 12 (twelve) hours.  ? fluticasone (FLONASE) 50 MCG/ACT nasal spray Place 2 sprays into both nostrils daily.  ? furosemide (LASIX) 20 MG tablet Take 1 tablet (20 mg total) by mouth daily as needed for edema.  ? hydrOXYzine (ATARAX/VISTARIL) 25 MG tablet Take 25 mg by mouth 3 (three) times daily as needed for itching.   ? levalbuterol (XOPENEX) 1.25 MG/3ML nebulizer solution Take 1.25 mg by nebulization every 6 (six) hours as needed for wheezing.  ? metoprolol succinate (TOPROL-XL) 25 MG 24 hr tablet Take 0.5 tablets (12.5 mg total) by mouth daily. Take with or immediately following a meal. (Patient taking differently: Take 25 mg by mouth daily. Take with or immediately following a meal.)  ? montelukast (SINGULAIR) 10 MG tablet Take 10 mg by mouth daily.  ? ondansetron (ZOFRAN) 4 MG tablet Take 1 tablet (4 mg total) by mouth every 8 (eight) hours as needed for nausea or vomiting.  ? pantoprazole (PROTONIX) 40 MG tablet Take 40 mg by mouth 2 (two)  times daily.   ? rivaroxaban (XARELTO) 20 MG TABS tablet Take 20 mg by mouth daily with breakfast.  ? SUMAtriptan (IMITREX) 25 MG tablet Take 1 tablet by mouth every 2 (two) hours as needed for migraine.   ? Vitamin D, Ergocalciferol, (DRISDOL) 1.25 MG (50000 UNIT) CAPS capsule Take 50,000 Units by mouth every Monday.  ? [DISCONTINUED] Besifloxacin HCl (BESIVANCE) 0.6 % SUSP Place 1 drop into the left eye 3 (three) times daily.  ? [DISCONTINUED] Bromfenac Sodium (PROLENSA) 0.07 % SOLN Place 1 drop into the left eye at bedtime.  ? [DISCONTINUED] Difluprednate (DUREZOL) 0.05 % EMUL Place 1 drop into the left eye 3 (three) times daily.  ? [DISCONTINUED] glimepiride (AMARYL) 4 MG tablet Take 4 mg by mouth daily with breakfast.  ? [DISCONTINUED] hydrochlorothiazide (HYDRODIURIL) 25 MG tablet Take 25 mg by mouth daily.  ? ?No facility-administered encounter medications on file as of 09/11/2021.  ? ? ?ALLERGIES: ?Allergies  ?Allergen Reactions  ? Atorvastatin   ?  Other reaction(s): Muscle Pain  ? Losartan Cough  ? Sitagliptin Swelling  ?  ABDOMINAL SWELLING  ? Metformin And Related Other (See Comments)  ?  Stomach swelling  ? Warfarin Sodium Other (See Comments)  ?  Patient didn't know what happened but states she couldn't take it  ? Penicillins Itching  ?  Has patient had a PCN reaction causing immediate rash, facial/tongue/throat swelling, SOB or lightheadedness with hypotension: No ?Has patient had a PCN reaction causing severe rash involving mucus membranes or skin necrosis: No ?Has patient had a PCN reaction that required hospitalization: No ?Has patient had a PCN reaction occurring within the last 10 years: No ?If all of the above answers are "NO", then may proceed with Cephalosporin use. ?  ? ? ?VACCINATION STATUS: ?Immunization History  ?Administered Date(s) Administered  ? Influenza, High Dose Seasonal PF 02/13/2017  ? PFIZER Comirnaty(Gray Top)Covid-19 Tri-Sucrose Vaccine 10/11/2019, 11/01/2019, 05/08/2020,  10/30/2020  ? Pneumococcal Conjugate-13 01/18/2015  ? ? ?Diabetes ?She presents for her initial diabetic visit. She has type 2 diabetes mellitus. Onset time: She was diagnosed at approximate age of 25 years. Her disease course has been worsening. There are no hypoglycemic associated symptoms. Pertinent negatives for hypoglycemia include no confusion, headaches, pallor or seizures. Associated symptoms include blurred vision, fatigue, polydipsia and polyuria. Pertinent negatives  for diabetes include no chest pain and no polyphagia. There are no hypoglycemic complications. Symptoms are worsening. Risk factors for coronary artery disease include diabetes mellitus, dyslipidemia, hypertension, obesity, post-menopausal and sedentary lifestyle. Current diabetic treatments: She is on glimepiride 4 mg p.o. daily at breakfast. Her weight is increasing steadily. She is following a generally unhealthy diet. When asked about meal planning, she reported none. She has not had a previous visit with a dietitian. She rarely participates in exercise. (She did not bring any logs nor meter with her.  Her point-of-care A1c today is 9.4%.  She denies hypoglycemic episodes.) An ACE inhibitor/angiotensin II receptor blocker is not being taken.  ?Hyperlipidemia ?This is a chronic problem. Exacerbating diseases include diabetes and obesity. Pertinent negatives include no chest pain, myalgias or shortness of breath. Current antihyperlipidemic treatment includes ezetimibe (She does not tolerate statins.). Risk factors for coronary artery disease include dyslipidemia, obesity, a sedentary lifestyle, post-menopausal, diabetes mellitus and hypertension.  ?Hypertension ?This is a chronic problem. The problem is controlled. Associated symptoms include blurred vision. Pertinent negatives include no chest pain, headaches, palpitations or shortness of breath. Risk factors for coronary artery disease include dyslipidemia, diabetes mellitus, obesity,  post-menopausal state and sedentary lifestyle. Past treatments include beta blockers and diuretics.  ? ? ?Review of Systems  ?Constitutional:  Positive for fatigue. Negative for chills, fever and unexpected weight ch

## 2021-09-18 ENCOUNTER — Ambulatory Visit (INDEPENDENT_AMBULATORY_CARE_PROVIDER_SITE_OTHER): Payer: Medicare HMO

## 2021-09-18 ENCOUNTER — Ambulatory Visit (INDEPENDENT_AMBULATORY_CARE_PROVIDER_SITE_OTHER): Payer: Medicare HMO | Admitting: Podiatry

## 2021-09-18 DIAGNOSIS — S99921A Unspecified injury of right foot, initial encounter: Secondary | ICD-10-CM | POA: Diagnosis not present

## 2021-09-18 LAB — COMPREHENSIVE METABOLIC PANEL WITH GFR
ALT: 17 IU/L (ref 0–32)
AST: 17 IU/L (ref 0–40)
Albumin/Globulin Ratio: 1.9 (ref 1.2–2.2)
Albumin: 4.2 g/dL (ref 3.7–4.7)
Alkaline Phosphatase: 55 IU/L (ref 44–121)
BUN/Creatinine Ratio: 14 (ref 12–28)
BUN: 14 mg/dL (ref 8–27)
Bilirubin Total: 0.4 mg/dL (ref 0.0–1.2)
CO2: 27 mmol/L (ref 20–29)
Calcium: 9.4 mg/dL (ref 8.7–10.3)
Chloride: 102 mmol/L (ref 96–106)
Creatinine, Ser: 1.01 mg/dL — ABNORMAL HIGH (ref 0.57–1.00)
Globulin, Total: 2.2 g/dL (ref 1.5–4.5)
Glucose: 163 mg/dL — ABNORMAL HIGH (ref 70–99)
Potassium: 3.9 mmol/L (ref 3.5–5.2)
Sodium: 143 mmol/L (ref 134–144)
Total Protein: 6.4 g/dL (ref 6.0–8.5)
eGFR: 56 mL/min/1.73 — ABNORMAL LOW

## 2021-09-18 LAB — LIPID PANEL
Chol/HDL Ratio: 4.3 ratio (ref 0.0–4.4)
Cholesterol, Total: 204 mg/dL — ABNORMAL HIGH (ref 100–199)
HDL: 48 mg/dL (ref >39)
LDL Chol Calc (NIH): 129 mg/dL — ABNORMAL HIGH (ref 0–99)
Triglycerides: 151 mg/dL — ABNORMAL HIGH (ref 0–149)
VLDL Cholesterol Cal: 27 mg/dL (ref 5–40)

## 2021-09-18 LAB — TSH: TSH: 1.33 u[IU]/mL (ref 0.450–4.500)

## 2021-09-18 LAB — T4, FREE: Free T4: 1.36 ng/dL (ref 0.82–1.77)

## 2021-09-18 LAB — VITAMIN D 25 HYDROXY (VIT D DEFICIENCY, FRACTURES): Vit D, 25-Hydroxy: 25.9 ng/mL — ABNORMAL LOW (ref 30.0–100.0)

## 2021-09-18 MED ORDER — ACETAMINOPHEN-CODEINE #3 300-30 MG PO TABS: 1.0000 | ORAL_TABLET | ORAL | 0 refills | Status: AC | PRN

## 2021-09-25 NOTE — Progress Notes (Signed)
?Subjective:  ?Patient ID: Mallory Arellano, female    DOB: 12/03/1940,  MRN: [id number] ? ?Chief Complaint  ?Patient presents with  ? Toe Injury  ? ? ?81 y.o. female presents with the above complaint.  Patient presents with complaint of right fourth digit pain.  Patient states that she stubbed it has been hurting since.  She states it hurts with ambulation there is some bruising associate with it.  She went to get it evaluated she was to make sure is not broken.  She is a diabetic.  She does not want to lose the toe. ? ? ?Review of Systems: Negative except as noted in the HPI. Denies N/V/F/Ch. ? ?Past Medical History:  ?Diagnosis Date  ? A-fib ([place])   ? Arthritis   ? Asthma   ? Back pain   ? Diabetes mellitus without complication ([place])   ? Diabetes mellitus, type II ([place])   ? Dysrhythmia   ? Ganglion cyst of foot   ? GERD (gastroesophageal reflux disease)   ? Hypercholesteremia   ? Hypertension   ? Lower extremity edema   ? Lumbar radiculitis   ? Osteoarthritis   ? Palpitation   ? Perioral dermatitis   ? UTI (urinary tract infection)   ? ? ?Current Outpatient Medications:  ?  acetaminophen-codeine (TYLENOL #3) 300-30 MG tablet, Take 1-2 tablets by mouth every 4 (four) hours as needed for moderate pain., Disp: 30 tablet, Rfl: 0 ?  budesonide-formoterol (SYMBICORT) 160-4.5 MCG/ACT inhaler, Inhale 2 puffs into the lungs 2 (two) times daily., Disp: , Rfl:  ?  chlorthalidone (HYGROTON) 25 MG tablet, Take 25 mg by mouth daily., Disp: , Rfl:  ?  diclofenac Sodium (VOLTAREN) 1 % GEL, Apply 2 g topically 2 (two) times daily., Disp: , Rfl:  ?  ezetimibe (ZETIA) 10 MG tablet, Take 1 tablet (10 mg total) by mouth daily. PLEASE SCHEDULE OFFICE VISIT FOR FURTHER REFILLS. THANK YOU!, Disp: 90 tablet, Rfl: 0 ?  fexofenadine (ALLEGRA) 180 MG tablet, Take 180 mg by mouth daily., Disp: , Rfl:  ?  flecainide (TAMBOCOR) 50 MG tablet, Take 1 tablet (50 mg total) by mouth every 12 (twelve) hours., Disp: 60 tablet, Rfl: 0 ?  fluticasone  (FLONASE) 50 MCG/ACT nasal spray, Place 2 sprays into both nostrils daily., Disp: , Rfl:  ?  furosemide (LASIX) 20 MG tablet, Take 1 tablet (20 mg total) by mouth daily as needed for edema., Disp: , Rfl:  ?  glipiZIDE (GLUCOTROL XL) 5 MG 24 hr tablet, Take 1 tablet (5 mg total) by mouth daily with breakfast., Disp: 90 tablet, Rfl: 1 ?  hydrOXYzine (ATARAX/VISTARIL) 25 MG tablet, Take 25 mg by mouth 3 (three) times daily as needed for itching. , Disp: , Rfl:  ?  levalbuterol (XOPENEX) 1.25 MG/3ML nebulizer solution, Take 1.25 mg by nebulization every 6 (six) hours as needed for wheezing., Disp: , Rfl:  ?  metoprolol succinate (TOPROL-XL) 25 MG 24 hr tablet, Take 0.5 tablets (12.5 mg total) by mouth daily. Take with or immediately following a meal. (Patient taking differently: Take 25 mg by mouth daily. Take with or immediately following a meal.), Disp: 46 tablet, Rfl: 3 ?  montelukast (SINGULAIR) 10 MG tablet, Take 10 mg by mouth daily., Disp: , Rfl:  ?  ondansetron (ZOFRAN) 4 MG tablet, Take 1 tablet (4 mg total) by mouth every 8 (eight) hours as needed for nausea or vomiting., Disp: 15 tablet, Rfl: 0 ?  pantoprazole (PROTONIX) 40 MG tablet, Take 40  mg by mouth 2 (two) times daily. , Disp: , Rfl: 11 ?  rivaroxaban (XARELTO) 20 MG TABS tablet, Take 20 mg by mouth daily with breakfast., Disp: , Rfl:  ?  SUMAtriptan (IMITREX) 25 MG tablet, Take 1 tablet by mouth every 2 (two) hours as needed for migraine. , Disp: , Rfl:  ?  Vitamin D, Ergocalciferol, (DRISDOL) 1.25 MG (50000 UNIT) CAPS capsule, Take 50,000 Units by mouth every Monday., Disp: , Rfl:  ? ?Social History  ? ?Tobacco Use  ?Smoking Status Never  ?Smokeless Tobacco Never  ? ? ?Allergies  ?Allergen Reactions  ? Atorvastatin   ?  Other reaction(s): Muscle Pain  ? Losartan Cough  ? Sitagliptin Swelling  ?  ABDOMINAL SWELLING  ? Metformin And Related Other (See Comments)  ?  Stomach swelling  ? Warfarin Sodium Other (See Comments)  ?  Patient didn't know what  happened but states she couldn't take it  ? Penicillins Itching  ?  Has patient had a PCN reaction causing immediate rash, facial/tongue/throat swelling, SOB or lightheadedness with hypotension: No ?Has patient had a PCN reaction causing severe rash involving mucus membranes or skin necrosis: No ?Has patient had a PCN reaction that required hospitalization: No ?Has patient had a PCN reaction occurring within the last 10 years: No ?If all of the above answers are "NO", then may proceed with Cephalosporin use. ?  ? ?Objective:  ?There were no vitals filed for this visit. ?There is no height or weight on file to calculate BMI. ?Constitutional Well developed. ?Well nourished.  ?Vascular Dorsalis pedis pulses palpable bilaterally. ?Posterior tibial pulses palpable bilaterally. ?Capillary refill normal to all digits.  ?No cyanosis or clubbing noted. ?Pedal hair growth normal.  ?Neurologic Normal speech. ?Oriented to person, place, and time. ?Epicritic sensation to light touch grossly present bilaterally.  ?Dermatologic Nails well groomed and normal in appearance. ?No open wounds. ?No skin lesions.  ?Orthopedic: Pain on palpation of right fourth digit.  Pain with range of motion of the fourth digit.  Ecchymosis noted.  No signs of gangrene noted.  Good cap refill noted to the digit.  ? ?Radiographs: 3 views of skeletally mature the right foot: Fracture noted to the right fourth digit at the proximal phalanx.  It is nondisplaced well aligned fracture. ?Assessment:  ? ?1. Injury of right toe, initial encounter   ? ?Plan:  ?Patient was evaluated and treated and all questions answered. ? ?Left fourth digit fracture of the proximal phalanx base nondisplaced ?-All questions and concerns were discussed with the patient extensive detail. ?-I discussed with her to take 6 to 8 weeks for her to start feeling better in the fracture to try to heal.  She states understanding.  I encouraged her to wear surgical shoe for 6 weeks.  I also  showed her and educated her on the importance of buddy splinting.  She states she will do that as well. ? ?No follow-ups on file.  ?

## 2021-09-29 ENCOUNTER — Ambulatory Visit: Payer: Medicare HMO | Admitting: Nutrition

## 2021-09-29 ENCOUNTER — Ambulatory Visit (INDEPENDENT_AMBULATORY_CARE_PROVIDER_SITE_OTHER): Payer: Medicare HMO | Admitting: "Endocrinology

## 2021-09-29 ENCOUNTER — Encounter: Payer: Self-pay | Admitting: "Endocrinology

## 2021-09-29 VITALS — BP 126/60 | HR 64 | Ht 66.0 in | Wt 194.4 lb

## 2021-09-29 DIAGNOSIS — E782 Mixed hyperlipidemia: Secondary | ICD-10-CM | POA: Diagnosis not present

## 2021-09-29 DIAGNOSIS — I10 Essential (primary) hypertension: Secondary | ICD-10-CM | POA: Diagnosis not present

## 2021-09-29 DIAGNOSIS — Z6831 Body mass index (BMI) 31.0-31.9, adult: Secondary | ICD-10-CM

## 2021-09-29 DIAGNOSIS — E559 Vitamin D deficiency, unspecified: Secondary | ICD-10-CM | POA: Diagnosis not present

## 2021-09-29 DIAGNOSIS — E1169 Type 2 diabetes mellitus with other specified complication: Secondary | ICD-10-CM | POA: Diagnosis not present

## 2021-09-29 DIAGNOSIS — E6609 Other obesity due to excess calories: Secondary | ICD-10-CM

## 2021-09-29 LAB — POCT UA - MICROALBUMIN
Albumin/Creatinine Ratio, Urine, POC: <30
Creatinine, POC: 300 mg/dL
Microalbumin Ur, POC: 30 mg/L

## 2021-09-29 MED ORDER — ACCU-CHEK GUIDE ME W/DEVICE KIT: 1.0000 | PACK | 0 refills | Status: DC

## 2021-09-29 MED ORDER — CHOLECALCIFEROL 50 MCG (2000 UT) PO CAPS: 1.0000 | ORAL_CAPSULE | Freq: Every day | ORAL | 1 refills | Status: DC

## 2021-09-29 MED ORDER — ACCU-CHEK GUIDE VI STRP: ORAL_STRIP | 2 refills | Status: DC

## 2021-09-29 NOTE — Patient Instructions (Signed)

## 2021-09-29 NOTE — Progress Notes (Signed)
? ?                                            ?     09/29/2021, 2:05 PM ? ?Endocrinology follow-up note ? ? ?Subjective:  ? ? Patient ID: Mallory Arellano, female    DOB: 1941-05-04.  ?Mallory Arellano is being seen in follow-up after she was seen in consultation for management of currently uncontrolled symptomatic diabetes.  Her recent labs also showed severe dyslipidemia, vitamin D deficiency ? ? ?Past Medical History:  ?Diagnosis Date  ? A-fib ([place])   ? Arthritis   ? Asthma   ? Back pain   ? Diabetes mellitus without complication ([place])   ? Diabetes mellitus, type II ([place])   ? Dysrhythmia   ? Ganglion cyst of foot   ? GERD (gastroesophageal reflux disease)   ? Hypercholesteremia   ? Hypertension   ? Lower extremity edema   ? Lumbar radiculitis   ? Osteoarthritis   ? Palpitation   ? Perioral dermatitis   ? UTI (urinary tract infection)   ? ? ?Past Surgical History:  ?Procedure Laterality Date  ? CATARACT EXTRACTION, BILATERAL    ? COLONOSCOPY    ? 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 Procedure: TOTAL HIP ARTHROPLASTY ANTERIOR APPROACH;  Surgeon: Hessie Knows, MD;  Location: [facility] ORS;  Service: Orthopedics;  Laterality: Right;  ? WISDOM TOOTH EXTRACTION    ? ? ?Social History  ? ?Socioeconomic History  ? Marital status: Widowed  ?  Spouse name: Not on file  ? Number of children: Not on file  ? Years of education: Not on file  ? Highest education level: Not on file  ?Occupational History  ? Not on file  ?Tobacco Use  ? Smoking status: Never  ? Smokeless tobacco: Never  ?Vaping Use  ? Vaping Use: Never used  ?Substance and Sexual Activity  ? Alcohol use: Not Currently  ?  Alcohol/week: 0.0 standard drinks  ? Drug use: No  ? Sexual activity: Not Currently  ?  Partners: Male  ?  Birth control/protection: Post-menopausal  ?Other Topics Concern  ? Not on file  ?Social History Narrative  ? Not on file  ? ?Social Determinants of Health  ? ?Financial Resource Strain: Not on file  ?Food Insecurity: Not on  file  ?Transportation Needs: Not on file  ?Physical Activity: Not on file  ?Stress: Not on file  ?Social Connections: Not on file  ? ? ?Family History  ?Problem Relation Age of Onset  ? Diabetes Mother   ? Hypertension Mother   ? Hypertension Brother   ? Diabetes Brother   ? Other Father   ?     "natural causes"  ? ? ?Outpatient Encounter Medications as of 09/29/2021  ?Medication Sig  ? Blood Glucose Monitoring Suppl (ACCU-CHEK GUIDE ME) w/Device KIT 1 Piece by Does not apply route as directed.  ? Cholecalciferol 50 MCG (2000 UT) CAPS Take 1 capsule (2,000 Units total) by mouth daily with breakfast.  ? glucose blood (ACCU-CHEK GUIDE) test strip Use as instructed  ? acetaminophen-codeine (TYLENOL #3) 300-30 MG tablet Take 1-2 tablets by mouth every 4 (four) hours as needed for moderate pain.  ? budesonide-formoterol (SYMBICORT) 160-4.5 MCG/ACT inhaler Inhale 2 puffs into the lungs 2 (two) times daily.  ? chlorthalidone (HYGROTON) 25 MG tablet Take 25 mg by mouth  daily.  ? diclofenac Sodium (VOLTAREN) 1 % GEL Apply 2 g topically 2 (two) times daily.  ? ezetimibe (ZETIA) 10 MG tablet Take 1 tablet (10 mg total) by mouth daily. PLEASE SCHEDULE OFFICE VISIT FOR FURTHER REFILLS. THANK YOU!  ? fexofenadine (ALLEGRA) 180 MG tablet Take 180 mg by mouth daily.  ? flecainide (TAMBOCOR) 50 MG tablet Take 1 tablet (50 mg total) by mouth every 12 (twelve) hours.  ? fluticasone (FLONASE) 50 MCG/ACT nasal spray Place 2 sprays into both nostrils daily.  ? furosemide (LASIX) 20 MG tablet Take 1 tablet (20 mg total) by mouth daily as needed for edema.  ? glipiZIDE (GLUCOTROL XL) 5 MG 24 hr tablet Take 1 tablet (5 mg total) by mouth daily with breakfast.  ? hydrOXYzine (ATARAX/VISTARIL) 25 MG tablet Take 25 mg by mouth 3 (three) times daily as needed for itching.   ? levalbuterol (XOPENEX) 1.25 MG/3ML nebulizer solution Take 1.25 mg by nebulization every 6 (six) hours as needed for wheezing.  ? metoprolol succinate (TOPROL-XL) 25 MG 24  hr tablet Take 0.5 tablets (12.5 mg total) by mouth daily. Take with or immediately following a meal. (Patient taking differently: Take 25 mg by mouth daily. Take with or immediately following a meal.)  ? montelukast (SINGULAIR) 10 MG tablet Take 10 mg by mouth daily.  ? ondansetron (ZOFRAN) 4 MG tablet Take 1 tablet (4 mg total) by mouth every 8 (eight) hours as needed for nausea or vomiting.  ? pantoprazole (PROTONIX) 40 MG tablet Take 40 mg by mouth 2 (two) times daily.   ? rivaroxaban (XARELTO) 20 MG TABS tablet Take 20 mg by mouth daily with breakfast.  ? SUMAtriptan (IMITREX) 25 MG tablet Take 1 tablet by mouth every 2 (two) hours as needed for migraine.   ? [DISCONTINUED] Vitamin D, Ergocalciferol, (DRISDOL) 1.25 MG (50000 UNIT) CAPS capsule Take 50,000 Units by mouth every Monday.  ? ?No facility-administered encounter medications on file as of 09/29/2021.  ? ? ?ALLERGIES: ?Allergies  ?Allergen Reactions  ? Atorvastatin   ?  Other reaction(s): Muscle Pain  ? Losartan Cough  ? Sitagliptin Swelling  ?  ABDOMINAL SWELLING  ? Metformin And Related Other (See Comments)  ?  Stomach swelling  ? Warfarin Sodium Other (See Comments)  ?  Patient didn't know what happened but states she couldn't take it  ? Penicillins Itching  ?  Has patient had a PCN reaction causing immediate rash, facial/tongue/throat swelling, SOB or lightheadedness with hypotension: No ?Has patient had a PCN reaction causing severe rash involving mucus membranes or skin necrosis: No ?Has patient had a PCN reaction that required hospitalization: No ?Has patient had a PCN reaction occurring within the last 10 years: No ?If all of the above answers are "NO", then may proceed with Cephalosporin use. ?  ? ? ?VACCINATION STATUS: ?Immunization History  ?Administered Date(s) Administered  ? Influenza, High Dose Seasonal PF 02/13/2017  ? PFIZER Comirnaty(Gray Top)Covid-19 Tri-Sucrose Vaccine 10/11/2019, 11/01/2019, 05/08/2020, 10/30/2020  ? Pneumococcal  Conjugate-13 01/18/2015  ? ? ?Diabetes ?She presents for her follow-up diabetic visit. She has type 2 diabetes mellitus. Onset time: She was diagnosed at approximate age of 63 years. Her disease course has been worsening. There are no hypoglycemic associated symptoms. Pertinent negatives for hypoglycemia include no confusion, headaches, pallor or seizures. Associated symptoms include blurred vision, fatigue, polydipsia and polyuria. Pertinent negatives for diabetes include no chest pain and no polyphagia. There are no hypoglycemic complications. Symptoms are worsening. Risk factors for  coronary artery disease include diabetes mellitus, dyslipidemia, hypertension, obesity, post-menopausal and sedentary lifestyle. Current diabetic treatments: She is on glimepiride 4 mg p.o. daily at breakfast. Her weight is increasing steadily (Lost 4 pounds since last visit.). She is following a generally unhealthy diet. When asked about meal planning, she reported none. She has not had a previous visit with a dietitian. She rarely participates in exercise. (She was supposed to present her logs and meter.  Unfortunately, she did not bring any logs nor meter, discloses that she does not have a functioning meter.  ?Her recent A1c was 9.4%.) An ACE inhibitor/angiotensin II receptor blocker is not being taken.  ?Hyperlipidemia ?This is a chronic problem. The problem is uncontrolled. Exacerbating diseases include diabetes and obesity. Pertinent negatives include no chest pain, myalgias or shortness of breath. Current antihyperlipidemic treatment includes ezetimibe (She does not tolerate statins.). Risk factors for coronary artery disease include dyslipidemia, obesity, a sedentary lifestyle, post-menopausal, diabetes mellitus and hypertension.  ?Hypertension ?This is a chronic problem. The problem is controlled. Associated symptoms include blurred vision. Pertinent negatives include no chest pain, headaches, palpitations or shortness of  breath. Risk factors for coronary artery disease include dyslipidemia, diabetes mellitus, obesity, post-menopausal state and sedentary lifestyle. Past treatments include beta blockers and diuretics.  ? ? ?Revie

## 2021-10-06 ENCOUNTER — Encounter: Payer: Self-pay | Admitting: Podiatry

## 2021-11-04 ENCOUNTER — Ambulatory Visit: Payer: Medicare HMO | Admitting: Nutrition

## 2021-11-06 ENCOUNTER — Ambulatory Visit: Payer: BLUE CROSS/BLUE SHIELD | Admitting: Podiatry

## 2021-11-11 ENCOUNTER — Ambulatory Visit: Payer: BLUE CROSS/BLUE SHIELD | Admitting: "Endocrinology

## 2021-11-20 ENCOUNTER — Telehealth: Payer: Self-pay | Admitting: Student

## 2021-11-20 ENCOUNTER — Ambulatory Visit (INDEPENDENT_AMBULATORY_CARE_PROVIDER_SITE_OTHER): Payer: Medicare HMO | Admitting: Podiatry

## 2021-11-20 DIAGNOSIS — S99921A Unspecified injury of right foot, initial encounter: Secondary | ICD-10-CM | POA: Diagnosis not present

## 2021-11-20 NOTE — Telephone Encounter (Signed)
Patint came in today she ordered diabetic shoes but did not get them. Patient has a new diabetic doctor she uses. Dr Cathren Laine in [place]. Patients number is [phone].

## 2021-11-22 ENCOUNTER — Other Ambulatory Visit: Payer: Self-pay

## 2021-11-22 ENCOUNTER — Emergency Department
Admission: EM | Admit: 2021-11-22 | Discharge: 2021-11-22 | Disposition: A | Discharge status: Home / Self Care | Payer: Medicare HMO | Attending: Emergency Medicine | Admitting: Emergency Medicine

## 2021-11-22 DIAGNOSIS — X58XXXA Exposure to other specified factors, initial encounter: Secondary | ICD-10-CM | POA: Diagnosis not present

## 2021-11-22 DIAGNOSIS — S29012A Strain of muscle and tendon of back wall of thorax, initial encounter: Secondary | ICD-10-CM | POA: Insufficient documentation

## 2021-11-22 DIAGNOSIS — S24109A Unspecified injury at unspecified level of thoracic spinal cord, initial encounter: Secondary | ICD-10-CM | POA: Diagnosis present

## 2021-11-22 LAB — BASIC METABOLIC PANEL
Anion gap: 7 (ref 5–15)
BUN: 20 mg/dL (ref 8–23)
CO2: 25 mmol/L (ref 22–32)
Calcium: 9.3 mg/dL (ref 8.9–10.3)
Chloride: 105 mmol/L (ref 98–111)
Creatinine, Ser: 0.99 mg/dL (ref 0.44–1.00)
GFR, Estimated: 57 mL/min — ABNORMAL LOW (ref >60)
Glucose, Bld: 176 mg/dL — ABNORMAL HIGH (ref 70–99)
Potassium: 3.3 mmol/L — ABNORMAL LOW (ref 3.5–5.1)
Sodium: 137 mmol/L (ref 135–145)

## 2021-11-22 LAB — CBC
HCT: 38.8 % (ref 36.0–46.0)
Hemoglobin: 12.5 g/dL (ref 12.0–15.0)
MCH: 28.2 pg (ref 26.0–34.0)
MCHC: 32.2 g/dL (ref 30.0–36.0)
MCV: 87.6 fL (ref 80.0–100.0)
Platelets: 273 10*3/uL (ref 150–400)
RBC: 4.43 MIL/uL (ref 3.87–5.11)
RDW: 12.5 % (ref 11.5–15.5)
WBC: 8.4 10*3/uL (ref 4.0–10.5)
nRBC: 0 % (ref 0.0–0.2)

## 2021-11-22 LAB — TROPONIN I (HIGH SENSITIVITY): Troponin I (High Sensitivity): 37 ng/L — ABNORMAL HIGH (ref <18)

## 2021-11-22 MED ORDER — CYCLOBENZAPRINE HCL 10 MG PO TABS: 10.0000 mg | ORAL_TABLET | Freq: Three times a day (TID) | ORAL | 0 refills | Status: AC | PRN

## 2021-11-22 MED ORDER — CYCLOBENZAPRINE HCL 10 MG PO TABS
10.0000 mg | ORAL_TABLET | Freq: Once | ORAL | Status: AC
  Administered 2021-11-22: 10 mg via ORAL
  Filled 2021-11-22: qty 1

## 2021-11-22 NOTE — ED Notes (Signed)
Pt up to commode in room, pt informed when back from commode will need to be placed on cardiac monitor. Pt verbalizes understanding, sarah primary rn notified pt on commode and will need to be placed on cardiac monitor when finished.

## 2021-11-22 NOTE — ED Provider Triage Note (Signed)
Emergency Medicine Provider Triage Evaluation Note  Mallory Arellano , a 81 y.o. female  was evaluated in triage.  Pt complains of acute onset pain in her upper back.  Review of Systems  Positive: Back pain that radiates throughout her posterior chest. Negative: Shortness of breath, numbness or weakness in her extremities, dizziness, syncope.  Physical Exam  BP (!) 167/75   Pulse 63   Temp 98.4 F (36.9 C) (Oral)   Resp 18   SpO2 97%  Gen:   Awake, no distress, though she appears a little bit uncomfortable. Resp:  Normal effort.  No accessory muscle usage, speaking in full sentences. MSK:   Moves extremities without difficulty.  Ambulatory without any apparent difficulty. Other:  No focal neurological deficits.  Medical Decision Making  Medically screening exam initiated at 6:47 AM.  Appropriate orders placed.  Mallory Arellano was informed that the remainder of the evaluation will be completed by another provider, this initial triage assessment does not replace that evaluation, and the importance of remaining in the ED until their evaluation is complete.  EKG reviewed, no STEMI, no significant suggestion of ischemia.  High-sensitivity troponin, CBC, basic metabolic panel all ordered.  High-sensitivity troponin resulted at 37 but this is consistent with her values in the past and not necessarily indicative of a new issue.  Deferring advanced imaging such as CTA chest to the discretion of the primary provider.   Hinda Kehr, MD 11/22/21 [phone]

## 2021-11-22 NOTE — ED Triage Notes (Signed)
Pt reports sudden onset of upper back pain between her shoulder blades that started yesterday around 1600.

## 2021-11-22 NOTE — ED Provider Notes (Signed)
[facility] Provider Note   Event Date/Time   First MD Initiated Contact with Patient 11/22/21 [phone]     (approximate) History  Back Pain  HPI Mallory Arellano is a 81 y.o. female  Location: Bilateral shoulder blades Duration: 1 day prior to arrival Timing: Stable since onset Severity: 10/10 Quality: Aching pain/soreness Context: Patient states that she was strenuously mopping all day yesterday and when she woke up she started feeling significant bilateral subscapular pain Modifying factors: Movement worsens her pain and is partially relieved with OTC analgesics Associated Symptoms: Denies ROS: Patient currently denies any vision changes, tinnitus, difficulty speaking, facial droop, sore throat, chest pain, shortness of breath, abdominal pain, nausea/vomiting/diarrhea, dysuria, or weakness/numbness/paresthesias in any extremity   Physical Exam  Triage Vital Signs: ED Triage Vitals  Enc Vitals Group     BP 11/22/21 0546 (!) 167/75     Pulse Rate 11/22/21 0546 63     Resp 11/22/21 0546 18     Temp 11/22/21 0546 98.4 F (36.9 C)     Temp Source 11/22/21 0546 Oral     SpO2 11/22/21 0546 97 %     Weight --      Height --      Head Circumference --      Peak Flow --      Pain Score 11/22/21 0547 10     Pain Loc --      Pain Edu? --      Excl. in [place]? --    Most recent vital signs: Vitals:   11/22/21 0546 11/22/21 0714  BP: (!) 167/75 (!) 154/73  Pulse: 63 67  Resp: 18 12  Temp: 98.4 F (36.9 C)   SpO2: 97% 97%   General: Awake, oriented x4. CV:  Good peripheral perfusion.  Resp:  Normal effort.  Abd:  No distention.  Other:  Elderly African-American overweight female laying in bed in no acute distress with tenderness to palpation over bilateral thoracic paraspinal musculature ED Results / Procedures / Treatments  Labs (all labs ordered are listed, but only abnormal results are displayed) Labs Reviewed  BASIC METABOLIC PANEL - Abnormal; Notable  for the following components:      Result Value   Potassium 3.3 (*)    Glucose, Bld 176 (*)    GFR, Estimated 57 (*)    All other components within normal limits  TROPONIN I (HIGH SENSITIVITY) - Abnormal; Notable for the following components:   Troponin I (High Sensitivity) 37 (*)    All other components within normal limits  CBC   EKG ED ECG REPORT I, Naaman Plummer, the attending physician, personally viewed and interpreted this ECG. Date: 11/22/2021 EKG Time: 0549 Rate: 61 Rhythm: normal sinus rhythm QRS Axis: normal Intervals: normal ST/T Wave abnormalities: normal Narrative Interpretation: no evidence of acute ischemia  PROCEDURES: Critical Care performed: No Procedures MEDICATIONS ORDERED IN ED: Medications  cyclobenzaprine (FLEXERIL) tablet 10 mg (has no administration in time range)   IMPRESSION / MDM / ASSESSMENT AND PLAN / ED COURSE  I reviewed the triage vital signs and the nursing notes.                             The patient is on the cardiac monitor to evaluate for evidence of arrhythmia and/or significant heart rate changes. Patient's presentation is most consistent with acute presentation with potential threat to life or bodily function. Patient presents for  low back pain. Given History and Exam the patient appears to be at low risk for Spinal Cord Compression Syndrome, Vertebral Malignancy/Mets, acute Spinal Fracture, Vertebral Osteomyelitis, Epidural Abscess, Infected or Obstructing Kidney Stone.  Their presentation appears most likely to be secondary to non-emergent musculoskeletal etiology vs non-emergent disc herniation.  ED Workup: Given thoracic region of patient's pain, lab work was taken and shows only significant for mild hypokalemia 3.3 as well as elevated troponin to 37.  Upon chart review, patient has mildly elevated troponin levels at baseline patient does see our cardiology team, Dr. Clayborn Bigness, and has been evaluated for her paroxysmal atrial  fibrillation.  Note from 05/28/2021 notes the patient did have a clean coronary cath and placed patient on Xarelto for her paroxysmal atrial fibrillation  Disposition: Discharge. Strict return precautions discussed with patient with full understanding. Advised patient to follow up promptly with primary care provider   FINAL CLINICAL IMPRESSION(S) / ED DIAGNOSES   Final diagnoses:  Strain of thoracic back region   Rx / DC Orders   ED Discharge Orders          Ordered    cyclobenzaprine (FLEXERIL) 10 MG tablet  3 times daily PRN        11/22/21 0718           Note:  This document was prepared using Dragon voice recognition software and may include unintentional dictation errors.   Naaman Plummer, MD 11/22/21 [phone]

## 2021-11-27 NOTE — Progress Notes (Signed)
Subjective:  Patient ID: Mallory Arellano, female    DOB: 10-31-1940,  MRN: [id number]  Chief Complaint  Patient presents with   Toe Pain    81 y.o. female presents with the above complaint.  Patient presents with follow-up of right fourth digit fracture.  She states she is doing a lot better no pain.  She denies any other acute complaints.  Review of Systems: Negative except as noted in the HPI. Denies N/V/F/Ch.  Past Medical History:  Diagnosis Date   A-fib ([place])    Arthritis    Asthma    Back pain    Diabetes mellitus without complication ([place])    Diabetes mellitus, type II ([place])    Dysrhythmia    Ganglion cyst of foot    GERD (gastroesophageal reflux disease)    Hypercholesteremia    Hypertension    Lower extremity edema    Lumbar radiculitis    Osteoarthritis    Palpitation    Perioral dermatitis    UTI (urinary tract infection)     Current Outpatient Medications:    acetaminophen-codeine (TYLENOL #3) 300-30 MG tablet, Take 1-2 tablets by mouth every 4 (four) hours as needed for moderate pain., Disp: 30 tablet, Rfl: 0   Blood Glucose Monitoring Suppl (ACCU-CHEK GUIDE ME) w/Device KIT, 1 Piece by Does not apply route as directed., Disp: 1 kit, Rfl: 0   budesonide-formoterol (SYMBICORT) 160-4.5 MCG/ACT inhaler, Inhale 2 puffs into the lungs 2 (two) times daily., Disp: , Rfl:    chlorthalidone (HYGROTON) 25 MG tablet, Take 25 mg by mouth daily., Disp: , Rfl:    Cholecalciferol 50 MCG (2000 UT) CAPS, Take 1 capsule (2,000 Units total) by mouth daily with breakfast., Disp: 90 capsule, Rfl: 1   diclofenac Sodium (VOLTAREN) 1 % GEL, Apply 2 g topically 2 (two) times daily., Disp: , Rfl:    ezetimibe (ZETIA) 10 MG tablet, Take 1 tablet (10 mg total) by mouth daily. PLEASE SCHEDULE OFFICE VISIT FOR FURTHER REFILLS. THANK YOU!, Disp: 90 tablet, Rfl: 0   fexofenadine (ALLEGRA) 180 MG tablet, Take 180 mg by mouth daily., Disp: , Rfl:    flecainide (TAMBOCOR) 50 MG tablet, Take 1  tablet (50 mg total) by mouth every 12 (twelve) hours., Disp: 60 tablet, Rfl: 0   fluticasone (FLONASE) 50 MCG/ACT nasal spray, Place 2 sprays into both nostrils daily., Disp: , Rfl:    furosemide (LASIX) 20 MG tablet, Take 1 tablet (20 mg total) by mouth daily as needed for edema., Disp: , Rfl:    glipiZIDE (GLUCOTROL XL) 5 MG 24 hr tablet, Take 1 tablet (5 mg total) by mouth daily with breakfast., Disp: 90 tablet, Rfl: 1   glucose blood (ACCU-CHEK GUIDE) test strip, Use as instructed, Disp: 150 each, Rfl: 2   hydrOXYzine (ATARAX/VISTARIL) 25 MG tablet, Take 25 mg by mouth 3 (three) times daily as needed for itching. , Disp: , Rfl:    levalbuterol (XOPENEX) 1.25 MG/3ML nebulizer solution, Take 1.25 mg by nebulization every 6 (six) hours as needed for wheezing., Disp: , Rfl:    metoprolol succinate (TOPROL-XL) 25 MG 24 hr tablet, Take 0.5 tablets (12.5 mg total) by mouth daily. Take with or immediately following a meal. (Patient taking differently: Take 25 mg by mouth daily. Take with or immediately following a meal.), Disp: 46 tablet, Rfl: 3   montelukast (SINGULAIR) 10 MG tablet, Take 10 mg by mouth daily., Disp: , Rfl:    ondansetron (ZOFRAN) 4 MG tablet, Take 1 tablet (4  mg total) by mouth every 8 (eight) hours as needed for nausea or vomiting., Disp: 15 tablet, Rfl: 0   pantoprazole (PROTONIX) 40 MG tablet, Take 40 mg by mouth 2 (two) times daily. , Disp: , Rfl: 11   rivaroxaban (XARELTO) 20 MG TABS tablet, Take 20 mg by mouth daily with breakfast., Disp: , Rfl:    SUMAtriptan (IMITREX) 25 MG tablet, Take 1 tablet by mouth every 2 (two) hours as needed for migraine. , Disp: , Rfl:   Social History   Tobacco Use  Smoking Status Never  Smokeless Tobacco Never    Allergies  Allergen Reactions   Atorvastatin     Other reaction(s): Muscle Pain   Losartan Cough   Sitagliptin Swelling    ABDOMINAL SWELLING   Metformin And Related Other (See Comments)    Stomach swelling   Warfarin Sodium  Other (See Comments)    Patient didn't know what happened but states she couldn't take it   Penicillins Itching    Has patient had a PCN reaction causing immediate rash, facial/tongue/throat swelling, SOB or lightheadedness with hypotension: No Has patient had a PCN reaction causing severe rash involving mucus membranes or skin necrosis: No Has patient had a PCN reaction that required hospitalization: No Has patient had a PCN reaction occurring within the last 10 years: No If all of the above answers are "NO", then may proceed with Cephalosporin use.    Objective:  There were no vitals filed for this visit. There is no height or weight on file to calculate BMI. Constitutional Well developed. Well nourished.  Vascular Dorsalis pedis pulses palpable bilaterally. Posterior tibial pulses palpable bilaterally. Capillary refill normal to all digits.  No cyanosis or clubbing noted. Pedal hair growth normal.  Neurologic Normal speech. Oriented to person, place, and time. Epicritic sensation to light touch grossly present bilaterally.  Dermatologic Nails well groomed and normal in appearance. No open wounds. No skin lesions.  Orthopedic: No further pain on palpation of right fourth digit.  No further pain with range of motion of the fourth digit.  No further ecchymosis noted.  No signs of gangrene noted.  Good cap refill noted to the digit.   Radiographs: 3 views of skeletally mature the right foot: Fracture noted to the right fourth digit at the proximal phalanx.  It is nondisplaced well aligned fracture. Assessment:   1. Injury of right toe, initial encounter     Plan:  Patient was evaluated and treated and all questions answered.  Left fourth digit fracture of the proximal phalanx base nondisplaced -All questions and concerns were discussed with the patient extensive detail. -Clinically healed and no longer causing her any pain.  At this time I discussed shoe gear modification and  toe protectors to keep the pressure away from the fourth toe if any foot and ankle issues were in the future of asked her to come back and see me.  She states understanding  No follow-ups on file.

## 2021-11-30 ENCOUNTER — Emergency Department
Admission: EM | Admit: 2021-11-30 | Discharge: 2021-11-30 | Disposition: A | Discharge status: Home / Self Care | Payer: Medicare HMO | Attending: Emergency Medicine | Admitting: Emergency Medicine

## 2021-11-30 ENCOUNTER — Other Ambulatory Visit: Payer: Self-pay

## 2021-11-30 DIAGNOSIS — R531 Weakness: Secondary | ICD-10-CM | POA: Insufficient documentation

## 2021-11-30 DIAGNOSIS — M549 Dorsalgia, unspecified: Secondary | ICD-10-CM | POA: Insufficient documentation

## 2021-11-30 DIAGNOSIS — R001 Bradycardia, unspecified: Secondary | ICD-10-CM | POA: Insufficient documentation

## 2021-11-30 DIAGNOSIS — R5383 Other fatigue: Secondary | ICD-10-CM | POA: Insufficient documentation

## 2021-11-30 LAB — CBC
HCT: 41.1 % (ref 36.0–46.0)
Hemoglobin: 13.3 g/dL (ref 12.0–15.0)
MCH: 28.4 pg (ref 26.0–34.0)
MCHC: 32.4 g/dL (ref 30.0–36.0)
MCV: 87.8 fL (ref 80.0–100.0)
Platelets: 311 10*3/uL (ref 150–400)
RBC: 4.68 MIL/uL (ref 3.87–5.11)
RDW: 12.3 % (ref 11.5–15.5)
WBC: 7.7 10*3/uL (ref 4.0–10.5)
nRBC: 0 % (ref 0.0–0.2)

## 2021-11-30 LAB — BASIC METABOLIC PANEL
Anion gap: 8 (ref 5–15)
BUN: 18 mg/dL (ref 8–23)
CO2: 30 mmol/L (ref 22–32)
Calcium: 9.3 mg/dL (ref 8.9–10.3)
Chloride: 102 mmol/L (ref 98–111)
Creatinine, Ser: 0.95 mg/dL (ref 0.44–1.00)
GFR, Estimated: >60 mL/min (ref >60)
Glucose, Bld: 179 mg/dL — ABNORMAL HIGH (ref 70–99)
Potassium: 3.8 mmol/L (ref 3.5–5.1)
Sodium: 140 mmol/L (ref 135–145)

## 2021-11-30 LAB — URINALYSIS, ROUTINE W REFLEX MICROSCOPIC
Bilirubin Urine: NEGATIVE
Glucose, UA: NEGATIVE mg/dL
Hgb urine dipstick: NEGATIVE
Ketones, ur: NEGATIVE mg/dL
Leukocytes,Ua: NEGATIVE
Nitrite: NEGATIVE
Protein, ur: NEGATIVE mg/dL
Specific Gravity, Urine: 1.01 (ref 1.005–1.030)
pH: 6.5 (ref 5.0–8.0)

## 2021-11-30 LAB — TROPONIN I (HIGH SENSITIVITY): Troponin I (High Sensitivity): 36 ng/L — ABNORMAL HIGH (ref <18)

## 2021-11-30 MED ORDER — KETOROLAC TROMETHAMINE 15 MG/ML IJ SOLN
15.0000 mg | Freq: Once | INTRAMUSCULAR | Status: AC
  Administered 2021-11-30: 15 mg via INTRAMUSCULAR

## 2021-11-30 MED ORDER — KETOROLAC TROMETHAMINE 15 MG/ML IJ SOLN
15.0000 mg | Freq: Once | INTRAMUSCULAR | Status: DC
  Filled 2021-11-30: qty 1

## 2021-11-30 NOTE — Discharge Instructions (Signed)
Please seek medical attention for any high fevers, chest pain, shortness of breath, change in behavior, persistent vomiting, bloody stool or any other new or concerning symptoms.  

## 2021-11-30 NOTE — ED Triage Notes (Signed)
Pt comes with c/o weakness that started yesterday. Pt states she felt so tired yesterday but didn't think anything of it. Pt states today she felt tired again and checked her pulse. Pt states it was reading in the 46s. Pt does have hx of afib and on thinner.

## 2021-11-30 NOTE — ED Provider Notes (Signed)
[facility] Provider Note    Event Date/Time   First MD Initiated Contact with Patient 11/30/21 1833     (approximate)   History   Weakness   HPI  Mallory Arellano is a 81 y.o. female  who per cardiology note dated three days ago has history of "fatigue relatively chronic recurrent unclear etiology" and "symptomatic sinus bradycardia" as well as paroxysmal a fib, who presents to the emergency department today because of concern for fatigue in the setting of a low heart rate. The patient states that over the past few days she has felt tired.  Today however it was more significant.  She felt like anytime she would sit down she could fall asleep.  Patient does state that she recently was started on muscle relaxer secondary to back and shoulder pain.  She says she was unaware of any history of low heart rate.  Physical Exam   Triage Vital Signs: ED Triage Vitals  Enc Vitals Group     BP 11/30/21 1718 (!) 147/71     Pulse Rate 11/30/21 1718 (!) 59     Resp 11/30/21 1718 18     Temp 11/30/21 1718 98.6 F (37 C)     Temp Source 11/30/21 1718 Oral     SpO2 11/30/21 1718 97 %     Weight --      Height --      Head Circumference --      Peak Flow --      Pain Score 11/30/21 1712 0     Pain Loc --      Pain Edu? --      Excl. in [place]? --     Most recent vital signs: Vitals:   11/30/21 1718  BP: (!) 147/71  Pulse: (!) 59  Resp: 18  Temp: 98.6 F (37 C)  SpO2: 97%    General: Awake, alert, oriented. CV:  Good peripheral perfusion. Bradycardia. Resp:  Normal effort. Lungs clear. Abd:  No distention. Non tender.   ED Results / Procedures / Treatments   Labs (all labs ordered are listed, but only abnormal results are displayed) Labs Reviewed  BASIC METABOLIC PANEL - Abnormal; Notable for the following components:      Result Value   Glucose, Bld 179 (*)    All other components within normal limits  CBC  URINALYSIS, ROUTINE W REFLEX MICROSCOPIC   CBG MONITORING, ED     EKG  I, Nance Pear, attending physician, personally viewed and interpreted this EKG  EKG Time: 1719 Rate: 59 Rhythm: sinus bradycardia Axis: left axis deviation Intervals: qtc 481 QRS: LVH ST changes: no st elevation Impression: abnormal ekg  RADIOLOGY None   PROCEDURES:  Critical Care performed: No  Procedures   MEDICATIONS ORDERED IN ED: Medications - No data to display   IMPRESSION / MDM / [place] / ED COURSE  I reviewed the triage vital signs and the nursing notes.                              Differential diagnosis includes, but is not limited to, anemia, electrolyte abnormality, medication effect.  Patient's presentation is most consistent with acute presentation with potential threat to life or bodily function.  Patient presented to the emergency department today because of concerns for fatigue and low heart rate.  Patient was recently started on muscle relaxers.  I do wonder if this  could be causing some of the patient's symptoms.  Blood work-up here without concerning anemia or electrolyte abnormality.  Did give patient some Toradol for her back pain for which she had been taking the muscle relaxers and she did feel improvement.  At this time do think it is reasonable for patient be discharged.  Discussed with patient she can follow-up with cardiology if she continues to have bradycardia.   FINAL CLINICAL IMPRESSION(S) / ED DIAGNOSES   Final diagnoses:  Weakness     Note:  This document was prepared using Dragon voice recognition software and may include unintentional dictation errors.    Nance Pear, MD 11/30/21 2117

## 2021-11-30 NOTE — ED Notes (Signed)
Received report from Ariel RN

## 2021-12-12 ENCOUNTER — Ambulatory Visit: Payer: BLUE CROSS/BLUE SHIELD | Admitting: "Endocrinology

## 2021-12-13 ENCOUNTER — Other Ambulatory Visit: Payer: Self-pay | Admitting: Cardiovascular Disease

## 2021-12-15 ENCOUNTER — Encounter: Payer: Medicare HMO | Attending: "Endocrinology | Admitting: Nutrition

## 2021-12-15 ENCOUNTER — Encounter: Payer: Self-pay | Admitting: "Endocrinology

## 2021-12-15 ENCOUNTER — Ambulatory Visit (INDEPENDENT_AMBULATORY_CARE_PROVIDER_SITE_OTHER): Payer: Medicare HMO | Admitting: "Endocrinology

## 2021-12-15 VITALS — BP 124/70 | HR 68 | Ht 66.0 in | Wt 190.2 lb

## 2021-12-15 DIAGNOSIS — E1169 Type 2 diabetes mellitus with other specified complication: Secondary | ICD-10-CM

## 2021-12-15 DIAGNOSIS — I10 Essential (primary) hypertension: Secondary | ICD-10-CM | POA: Diagnosis not present

## 2021-12-15 DIAGNOSIS — E6609 Other obesity due to excess calories: Secondary | ICD-10-CM

## 2021-12-15 DIAGNOSIS — Z6830 Body mass index (BMI) 30.0-30.9, adult: Secondary | ICD-10-CM

## 2021-12-15 DIAGNOSIS — E559 Vitamin D deficiency, unspecified: Secondary | ICD-10-CM

## 2021-12-15 DIAGNOSIS — E119 Type 2 diabetes mellitus without complications: Secondary | ICD-10-CM | POA: Diagnosis not present

## 2021-12-15 DIAGNOSIS — E782 Mixed hyperlipidemia: Secondary | ICD-10-CM

## 2021-12-15 DIAGNOSIS — Z713 Dietary counseling and surveillance: Secondary | ICD-10-CM | POA: Diagnosis present

## 2021-12-15 LAB — POCT GLYCOSYLATED HEMOGLOBIN (HGB A1C): HbA1c, POC (controlled diabetic range): 8.8 % — AB (ref 0.0–7.0)

## 2021-12-15 MED ORDER — DAPAGLIFLOZIN PROPANEDIOL 5 MG PO TABS: 5.0000 mg | ORAL_TABLET | Freq: Every day | ORAL | 1 refills | Status: DC

## 2021-12-15 NOTE — Patient Instructions (Addendum)
Goals  Follow Plant based meals. Drink 80 oz of water Walk 30 minutes 5 days per week Lose 2 lbs per month Dont' skip meals Get A1C down to 7%

## 2021-12-15 NOTE — Progress Notes (Unsigned)
Medical Nutrition Therapy  Appointment Start time:  1000  Appointment End time:  1100  Primary concerns today: Type 2 DM  Referral diagnosis: E11.8 Preferred learning style: No preference  Learning readiness:  ready   NUTRITION ASSESSMENT  43 y old bfemale here for education on DM and weight loss. She was schedule to see someone in [place] but it was too far and she wanted to come to [place] to see someone closer.  Sees Dr. Dorris Fetch, Endocrinology. Lost 4 lbs since last visit with him. A1C 8.8%, down from 9.4% previously. FBS 187 mg/dl.  She is trying to eat more whole food that is plant based. Eating meals on time.  Cutting out snacks and eating late at night.    Anthropometrics  Wt Readings from Last 3 Encounters:  12/15/21 190 lb 3.2 oz (86.3 kg)  09/29/21 194 lb 6.4 oz (88.2 kg)  09/11/21 198 lb 6.4 oz (90 kg)   Ht Readings from Last 3 Encounters:  12/15/21 '5\' 6"'$  (1.676 m)  09/29/21 '5\' 6"'$  (1.676 m)  09/11/21 '5\' 6"'$  (1.676 m)   There is no height or weight on file to calculate BMI. '@BMIFA'$ @ Facility age limit for growth %iles is 20 years. Facility age limit for growth %iles is 20 years.    Clinical Medical Hx:  Type 2 DM, Obesity, Hyperlipidemia, Asthma, See chart Medications: Glipizide 5 mg once a day,  Labs:  Lab Results  Component Value Date   HGBA1C 8.8 (A) 12/15/2021      Latest Ref Rng & Units 11/30/2021    5:24 PM 11/22/2021    5:50 AM 09/17/2021    7:14 AM  CMP  Glucose 70 - 99 mg/dL 179  176  163   BUN 8 - 23 mg/dL '18  20  14   '$ Creatinine 0.44 - 1.00 mg/dL 0.95  0.99  1.01   Sodium 135 - 145 mmol/L 140  137  143   Potassium 3.5 - 5.1 mmol/L 3.8  3.3  3.9   Chloride 98 - 111 mmol/L 102  105  102   CO2 22 - 32 mmol/L '30  25  27   '$ Calcium 8.9 - 10.3 mg/dL 9.3  9.3  9.4   Total Protein 6.0 - 8.5 g/dL   6.4   Total Bilirubin 0.0 - 1.2 mg/dL   0.4   Alkaline Phos 44 - 121 IU/L   55   AST 0 - 40 IU/L   17   ALT 0 - 32 IU/L   17    Lipid Panel      Component Value Date/Time   CHOL 204 (H) 09/17/2021 0714   TRIG 151 (H) 09/17/2021 0714   HDL 48 09/17/2021 0714   CHOLHDL 4.3 09/17/2021 0714   CHOLHDL 3 01/18/2015 1033   VLDL 24.6 01/18/2015 1033   LDLCALC 129 (H) 09/17/2021 0714   LABVLDL 27 09/17/2021 0714    Notable Signs/Symptoms: None  Lifestyle & Dietary Hx Lives by herself.A widow. Eating 3 meals per day Cooks most meals at home  Estimated daily fluid intake: 30 oz SupplemenB 12, vit D 3, ts:  Sleep: 6  Stress / self-care: None Current average weekly physical activity: ADL  24-Hr Dietary Recall First Meal: Chicken soup homemade: broth and some chicken or usually oatmeal or grits and fruit Snack:  Second Meal: Salmon, baked sweet potato and salad,  selzer water with lemon Snack: carrots Third Meal: Rest of lunch, waer Snack: cookie-oreo, peppermint tea Beverages: water  Estimated  Energy Needs Calories: 1200 Carbohydrate: 135g Protein: 90g Fat: 33g   NUTRITION DIAGNOSIS  NB-1.1 Food and nutrition-related knowledge deficit As related to Diabetes Type 2.  As evidenced by A1C 8.8%.   NUTRITION INTERVENTION  Nutrition education (E-1) on the following topics:  Nutrition and Diabetes education provided on My Plate, CHO counting, meal planning, portion sizes, timing of meals, avoiding snacks between meals unless having a low blood sugar, target ranges for A1C and blood sugars, signs/symptoms and treatment of hyper/hypoglycemia, monitoring blood sugars, taking medications as prescribed, benefits of exercising 30 minutes per day and prevention of complications of DM.   Lifestyle Medicine  - Whole Food, Plant Predominant Nutrition is highly recommended: Eat Plenty of vegetables, Mushrooms, fruits, Legumes, Whole Grains, Nuts, seeds in lieu of processed meats, processed snacks/pastries red meat, poultry, eggs.    -It is better to avoid simple carbohydrates including: Cakes, Sweet Desserts, Ice Cream, Soda (diet and  regular), Sweet Tea, Candies, Chips, Cookies, Store Bought Juices, Alcohol in Excess of  1-2 drinks a day, Lemonade,  Artificial Sweeteners, Doughnuts, Coffee Creamers, "Sugar-free" Products, etc, etc.  This is not a complete list.....  Exercise: If you are able: 30 -60 minutes a day ,4 days a week, or 150 minutes a week.  The longer the better.  Combine stretch, strength, and aerobic activities.  If you were told in the past that you have high risk for cardiovascular diseases, you may seek evaluation by your heart doctor prior to initiating moderate to intense exercise programs.   Handouts Provided Include  Lifestyle Medicine  Learning Style & Readiness for Change Teaching method utilized: Visual & Auditory  Demonstrated degree of understanding via: Teach Back  Barriers to learning/adherence to lifestyle change: none  Goals Established by Pt Goals  Follow Plant based meals. Drink 80 oz of water Walk 30 minutes 5 days per week Lose 2 lbs per month Dont' skip meals Get A1C down to 7%  Goals  Follow Plant based meals. Drink 80 oz of water Walk 30 minutes 5 days per week Lose 2 lbs per month Dont' skip meals Get A1C down to 7% MONITORING & EVALUATION Dietary intake, weekly physical activity, and blood sugars in 1 month.  Next Steps  Patient is to work on lifestyle medicine.Marland Kitchen

## 2021-12-15 NOTE — Progress Notes (Signed)
12/15/2021, 12:25 PM  Endocrinology follow-up note   Subjective:    Patient ID: Mallory Arellano, female    DOB: Nov 09, 1940.  Mallory Arellano is being seen in follow-up after she was seen in consultation for management of currently uncontrolled symptomatic diabetes.  Her recent labs also showed severe dyslipidemia, vitamin D deficiency   Past Medical History:  Diagnosis Date   A-fib ([place])    Arthritis    Asthma    Back pain    Diabetes mellitus without complication ([place])    Diabetes mellitus, type II ([place])    Dysrhythmia    Ganglion cyst of foot    GERD (gastroesophageal reflux disease)    Hypercholesteremia    Hypertension    Lower extremity edema    Lumbar radiculitis    Osteoarthritis    Palpitation    Perioral dermatitis    UTI (urinary tract infection)     Past Surgical History:  Procedure Laterality Date   CATARACT EXTRACTION, BILATERAL     COLONOSCOPY     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   Procedure: TOTAL HIP ARTHROPLASTY ANTERIOR APPROACH;  Surgeon: Hessie Knows, MD;  Location: [facility] ORS;  Service: Orthopedics;  Laterality: Right;   WISDOM TOOTH EXTRACTION      Social History   Socioeconomic History   Marital status: Widowed    Spouse name: Not on file   Number of children: Not on file   Years of education: Not on file   Highest education level: Not on file  Occupational History   Not on file  Tobacco Use   Smoking status: Never   Smokeless tobacco: Never  Vaping Use   Vaping Use: Never used  Substance and Sexual Activity   Alcohol use: Not Currently    Alcohol/week: 0.0 standard drinks of alcohol   Drug use: No   Sexual activity: Not Currently    Partners: Male    Birth control/protection: Post-menopausal  Other Topics Concern   Not on file  Social History Narrative   Not on file   Social Determinants of Health   Financial Resource Strain: Not on file  Food  Insecurity: Not on file  Transportation Needs: Not on file  Physical Activity: Not on file  Stress: Not on file  Social Connections: Not on file    Family History  Problem Relation Age of Onset   Diabetes Mother    Hypertension Mother    Hypertension Brother    Diabetes Brother    Other Father        "natural causes"    Outpatient Encounter Medications as of 12/15/2021  Medication Sig   dapagliflozin propanediol (FARXIGA) 5 MG TABS tablet Take 1 tablet (5 mg total) by mouth daily before breakfast.   acetaminophen-codeine (TYLENOL #3) 300-30 MG tablet Take 1-2 tablets by mouth every 4 (four) hours as needed for moderate pain.   Blood Glucose Monitoring Suppl (ACCU-CHEK GUIDE ME) w/Device KIT 1 Piece by Does not apply route as directed.   budesonide-formoterol (SYMBICORT) 160-4.5 MCG/ACT inhaler Inhale 2 puffs into the lungs 2 (two) times daily.   chlorthalidone (HYGROTON) 25 MG tablet Take 25 mg by mouth daily.   Cholecalciferol 50 MCG (2000 UT)  CAPS Take 1 capsule (2,000 Units total) by mouth daily with breakfast.   diclofenac Sodium (VOLTAREN) 1 % GEL Apply 2 g topically 2 (two) times daily.   ezetimibe (ZETIA) 10 MG tablet Take 1 tablet (10 mg total) by mouth daily. PLEASE SCHEDULE OFFICE VISIT FOR FURTHER REFILLS. THANK YOU!   fexofenadine (ALLEGRA) 180 MG tablet Take 180 mg by mouth daily.   flecainide (TAMBOCOR) 50 MG tablet Take 1 tablet (50 mg total) by mouth every 12 (twelve) hours.   fluticasone (FLONASE) 50 MCG/ACT nasal spray Place 2 sprays into both nostrils daily.   furosemide (LASIX) 20 MG tablet Take 1 tablet (20 mg total) by mouth daily as needed for edema.   glipiZIDE (GLUCOTROL XL) 5 MG 24 hr tablet Take 1 tablet (5 mg total) by mouth daily with breakfast.   glucose blood (ACCU-CHEK GUIDE) test strip Use as instructed   hydrOXYzine (ATARAX/VISTARIL) 25 MG tablet Take 25 mg by mouth 3 (three) times daily as needed for itching.    levalbuterol (XOPENEX) 1.25 MG/3ML  nebulizer solution Take 1.25 mg by nebulization every 6 (six) hours as needed for wheezing.   metoprolol succinate (TOPROL-XL) 25 MG 24 hr tablet Take 0.5 tablets (12.5 mg total) by mouth daily. Take with or immediately following a meal.   montelukast (SINGULAIR) 10 MG tablet Take 10 mg by mouth daily.   ondansetron (ZOFRAN) 4 MG tablet Take 1 tablet (4 mg total) by mouth every 8 (eight) hours as needed for nausea or vomiting.   pantoprazole (PROTONIX) 40 MG tablet Take 40 mg by mouth 2 (two) times daily.    rivaroxaban (XARELTO) 20 MG TABS tablet Take 20 mg by mouth daily with breakfast.   SUMAtriptan (IMITREX) 25 MG tablet Take 1 tablet by mouth every 2 (two) hours as needed for migraine.    No facility-administered encounter medications on file as of 12/15/2021.    ALLERGIES: Allergies  Allergen Reactions   Atorvastatin     Other reaction(s): Muscle Pain   Losartan Cough   Sitagliptin Swelling    ABDOMINAL SWELLING   Metformin And Related Other (See Comments)    Stomach swelling   Warfarin Sodium Other (See Comments)    Patient didn't know what happened but states she couldn't take it   Penicillins Itching    Has patient had a PCN reaction causing immediate rash, facial/tongue/throat swelling, SOB or lightheadedness with hypotension: No Has patient had a PCN reaction causing severe rash involving mucus membranes or skin necrosis: No Has patient had a PCN reaction that required hospitalization: No Has patient had a PCN reaction occurring within the last 10 years: No If all of the above answers are "NO", then may proceed with Cephalosporin use.     VACCINATION STATUS: Immunization History  Administered Date(s) Administered   Influenza, High Dose Seasonal PF 02/13/2017   PFIZER Comirnaty(Gray Top)Covid-19 Tri-Sucrose Vaccine 10/11/2019, 11/01/2019, 05/08/2020, 10/30/2020   Pneumococcal Conjugate-13 01/18/2015    Diabetes She presents for her follow-up diabetic visit. She has  type 2 diabetes mellitus. Onset time: She was diagnosed at approximate age of 31 years. Her disease course has been improving. There are no hypoglycemic associated symptoms. Pertinent negatives for hypoglycemia include no confusion, headaches, pallor or seizures. Associated symptoms include blurred vision, fatigue, polydipsia and polyuria. Pertinent negatives for diabetes include no chest pain and no polyphagia. There are no hypoglycemic complications. Symptoms are improving. Risk factors for coronary artery disease include diabetes mellitus, dyslipidemia, hypertension, obesity, post-menopausal and sedentary lifestyle. Current  diabetic treatments: She is on glimepiride 4 mg p.o. daily at breakfast. Her weight is increasing steadily (Lost 4 pounds since last visit.). She is following a generally unhealthy diet. When asked about meal planning, she reported none. She has not had a previous visit with a dietitian. She rarely participates in exercise. Her home blood glucose trend is decreasing steadily. Her breakfast blood glucose range is generally 140-180 mg/dl. Her bedtime blood glucose range is generally 140-180 mg/dl. Her overall blood glucose range is 140-180 mg/dl. (She presents with slight improvement in her glycemic profile, averaging 175 for the last 14 days, 187 for the last 90 days.  Her point-of-care A1c is 8.8%, improving from 9.4%.  She did not document any hypoglycemia.  She remains only on glipizide 5 mg XL p.o. daily at breakfast.   ) An ACE inhibitor/angiotensin II receptor blocker is not being taken.  Hyperlipidemia This is a chronic problem. The problem is uncontrolled. Exacerbating diseases include diabetes and obesity. Pertinent negatives include no chest pain, myalgias or shortness of breath. Current antihyperlipidemic treatment includes ezetimibe (She does not tolerate statins.). Risk factors for coronary artery disease include dyslipidemia, obesity, a sedentary lifestyle, post-menopausal,  diabetes mellitus and hypertension.  Hypertension This is a chronic problem. The problem is controlled. Associated symptoms include blurred vision. Pertinent negatives include no chest pain, headaches, palpitations or shortness of breath. Risk factors for coronary artery disease include dyslipidemia, diabetes mellitus, obesity, post-menopausal state and sedentary lifestyle. Past treatments include beta blockers and diuretics.     Review of Systems  Constitutional:  Positive for fatigue. Negative for chills, fever and unexpected weight change.  HENT:  Negative for trouble swallowing and voice change.   Eyes:  Positive for blurred vision. Negative for visual disturbance.  Respiratory:  Negative for cough, shortness of breath and wheezing.   Cardiovascular:  Positive for leg swelling. Negative for chest pain and palpitations.  Gastrointestinal:  Negative for diarrhea, nausea and vomiting.  Endocrine: Positive for polydipsia and polyuria. Negative for cold intolerance, heat intolerance and polyphagia.  Musculoskeletal:  Negative for arthralgias and myalgias.  Skin:  Negative for color change, pallor, rash and wound.  Neurological:  Negative for seizures and headaches.  Psychiatric/Behavioral:  Negative for confusion and suicidal ideas.     Objective:       12/15/2021    9:29 AM 11/30/2021    9:05 PM 11/30/2021    8:00 PM  Vitals with BMI  Height _0     Weight 190 lbs 3 oz    BMI 09.40    Systolic 768 088 110  Diastolic 70 70 75  Pulse 68 60 62    BP 124/70   Pulse 68   Ht _1  (1.676 m)   Wt 190 lb 3.2 oz (86.3 kg)   BMI 30.70 kg/m   Wt Readings from Last 3 Encounters:  12/15/21 190 lb 3.2 oz (86.3 kg)  09/29/21 194 lb 6.4 oz (88.2 kg)  09/11/21 198 lb 6.4 oz (90 kg)       CMP ( most recent) CMP     Component Value Date/Time   NA 140 11/30/2021 1724   NA 143 09/17/2021 0714   K 3.8 11/30/2021 1724   CL 102 11/30/2021 1724   CO2 30 11/30/2021 1724   GLUCOSE 179 (H)  11/30/2021 1724   BUN 18 11/30/2021 1724   BUN 14 09/17/2021 0714   CREATININE 0.95 11/30/2021 1724   CALCIUM 9.3 11/30/2021 1724   PROT 6.4 09/17/2021  0714   ALBUMIN 4.2 09/17/2021 0714   AST 17 09/17/2021 0714   ALT 17 09/17/2021 0714   ALKPHOS 55 09/17/2021 0714   BILITOT 0.4 09/17/2021 0714   GFRNONAA >60 11/30/2021 1724   GFRAA >60 08/13/2019 0518     Diabetic Labs (most recent): Lab Results  Component Value Date   HGBA1C 8.8 (A) 12/15/2021   HGBA1C 9.4 (A) 09/11/2021   HGBA1C 8.0 (H) 08/12/2019   MICROALBUR 30 09/29/2021   MICROALBUR <0.7 01/18/2015     Lipid Panel ( most recent) Lipid Panel     Component Value Date/Time   CHOL 204 (H) 09/17/2021 0714   TRIG 151 (H) 09/17/2021 0714   HDL 48 09/17/2021 0714   CHOLHDL 4.3 09/17/2021 0714   CHOLHDL 3 01/18/2015 1033   VLDL 24.6 01/18/2015 1033   LDLCALC 129 (H) 09/17/2021 0714   LABVLDL 27 09/17/2021 0714      Lab Results  Component Value Date   TSH 1.330 09/17/2021   TSH 1.073 08/12/2019   TSH 0.646 02/13/2017   FREET4 1.36 09/17/2021   FREET4 0.99 08/12/2019     25-hydroxy vitamin D  is 25.9 Urine microalbuminuria 30 mg/l  Assessment & Plan:   1. Type 2 diabetes mellitus with other specified complication, without long-term current use of insulin (HCC)  - Mallory Arellano has currently uncontrolled symptomatic type 2 DM since  81 years of age. She presents with slight improvement in her glycemic profile, averaging 175 for the last 14 days, 187 for the last 90 days.  Her point-of-care A1c is 8.8%, improving from 9.4%.  She did not document any hypoglycemia.  She remains only on glipizide 5 mg XL p.o. daily at breakfast.   Recent labs reviewed and discussed with her. - I had a long discussion with her about the progressive nature of diabetes and the pathology behind its complications. -her diabetes is complicated by obesity/sedentary life, hyperlipidemia, hypertension and she remains at a high risk for more  acute and chronic complications which include CAD, CVA, CKD, retinopathy, and neuropathy. These are all discussed in detail with her.  - I discussed all available options of managing her diabetes including de-escalation of medications.  -She called and sought this care based on a TV segment she saw about lifestyle medicine.  She will benefit tremendously from lifestyle medicine.  I have counseled her on diet  and weight management  by adopting a Whole Food , Plant Predominant  ( WFPP) nutrition as recommended by [employer] of Lifestyle Medicine. Patient is encouraged to switch to  unprocessed or minimally processed  complex starch, adequate protein intake (mainly plant source), minimal liquid fat ( mainly vegetable oils), plenty of fruits, and vegetables. -  she is advised to stick to a routine mealtimes to eat 3 complete meals a day and snack only when necessary ( to snack only to correct hypoglycemia BG <70 day time or <100 at night).   - she acknowledges that there is a room for improvement in her food and drink choices. - Suggestion is made for her to avoid simple carbohydrates  from her diet including Cakes, Sweet Desserts, Ice Cream, Soda (diet and regular), Sweet Tea, Candies, Chips, Cookies, Store Bought Juices, Alcohol , Artificial Sweeteners,  Coffee Creamer, and "Sugar-free" Products, Lemonade. This will help patient to have more stable blood glucose profile and potentially avoid unintended weight gain.  The following Lifestyle Medicine recommendations according to [facility]) were  discussed and and offered to patient and she  agrees to start the journey:  A. Whole Foods, Plant-Based Nutrition comprising of fruits and vegetables, plant-based proteins, whole-grain carbohydrates was discussed in detail with the patient.   A list for source of those nutrients were also provided to the patient.  Patient will use only water or unsweetened tea for  hydration. B.  The need to stay away from risky substances including alcohol, smoking; obtaining 7 to 9 hours of restorative sleep, at least 150 minutes of moderate intensity exercise weekly, the importance of healthy social connections,  and stress management techniques were discussed. C.  A full color page of  Calorie density of various food groups per pound showing examples of each food groups was provided to the patient.  - she will be scheduled with Jearld Fenton, RDN, CDE for individualized diabetes education.  - I have approached her with the following plan to manage  her diabetes and patient agrees:   - she did bring her meter showing above target glycemic profile.  In light of her presentation with still significantly above target glycemic profile, she will need more treatment.  I discussed and added Farxiga 5 mg p.o. daily at breakfast, side effects and precautions discussed with her.    She will continue on glipizide 5 mg XL p.o. daily at breakfast as well.    She is encouraged to continue monitoring blood glucose twice a day-daily before breakfast and at bedtime.    - she is encouraged to call clinic for blood glucose levels less than 70 or above 200 mg /dl. -She has several options to treat her diabetes including GLP-1 receptor agonist, or basal insulin if she returns with uncontrolled glycemic profile.  Her best option is lifestyle medicine, see above.   - Specific targets for  A1c;  LDL, HDL,  and Triglycerides were discussed with the patient.  2) Blood Pressure /Hypertension:   Her blood pressure is controlled to target.  she is advised to continue her current medications including metoprolol 25 mg p.o. daily at breakfast, chlorthalidone 25 mg p.o. daily at breakfast.  Mg p.o. daily with breakfast .  3) Lipids/Hyperlipidemia: Her recent lipid panel showed uncontrolled LDL at 129.   She reports statin intolerance.  The above described WF PB diet will help with dyslipidemia.  She  is advised to continue ezetimibe 10 mg p.o. daily.  She will have repeat fasting lipid panel before her next visit.   4)  Weight/Diet:  Body mass index is 30.7 kg/m.  -   clearly complicating her diabetes care.   she is  a candidate for weight loss. I discussed with her the fact that loss of 5 - 10% of her  current body weight will have the most impact on her diabetes management.  The above detailed  ACLM recommendations for nutrition, exercise, sleep, social life, avoidance of risky substances, the need for restorative sleep   information will also detailed on discharge instructions.   5) vitamin D deficiency: She is advised to maintain with vitamin D3 2000 units daily.  6) Chronic Care/Health Maintenance:  -she  is not  on ACEI/ARB and Statin medications (will consider on subsequent visits as appropriate ) and  is encouraged to initiate and continue to follow up with Ophthalmology, Dentist,  Podiatrist at least yearly or according to recommendations, and advised to   stay away from smoking. I have recommended yearly flu vaccine and pneumonia vaccine at least every 5 years; moderate intensity  exercise for up to 150 minutes weekly; and  sleep for 7- 9 hours a day.  - she is  advised to maintain close follow up with Pcp, No for primary care needs, as well as her other providers for optimal and coordinated care.  I spent 41 minutes in the care of the patient today including review of labs from [facility], Lipids, Thyroid Function, Hematology (current and previous including abstractions from other facilities); face-to-face time discussing  her blood glucose readings/logs, discussing hypoglycemia and hyperglycemia episodes and symptoms, medications doses, her options of short and long term treatment based on the latest standards of care / guidelines;  discussion about incorporating lifestyle medicine;  and documenting the encounter. Risk reduction counseling performed per USPSTF guidelines to reduce obesity and  cardiovascular risk factors.     Please refer to Patient Instructions for Blood Glucose Monitoring and Insulin/Medications Dosing Guide"  in media tab for additional information. Please  also refer to " Patient Self Inventory" in the Media  tab for reviewed elements of pertinent patient history.  Mallory Arellano participated in the discussions, expressed understanding, and voiced agreement with the above plans.  All questions were answered to her satisfaction. she is encouraged to contact clinic should she have any questions or concerns prior to her return visit.   Follow up plan: - Return in about 3 months (around 03/17/2022) for F/U with Pre-visit Labs, Meter/CGM/Logs, A1c here.  Glade Lloyd, MD [facility] [address] [place], [place] [address] Phone: [phone]  Fax: [phone]    12/15/2021, 12:25 PM  This note was partially dictated with voice recognition software. Similar sounding words can be transcribed inadequately or may not  be corrected upon review.

## 2021-12-15 NOTE — Patient Instructions (Signed)
                                     Advice for Weight Management  -For most of us the best way to lose weight is by diet management. Generally speaking, diet management means consuming less calories intentionally which over time brings about progressive weight loss.  This can be achieved more effectively by avoiding ultra processed carbohydrates, processed meats, unhealthy fats.    It is critically important to know your numbers: how much calorie you are consuming and how much calorie you need. More importantly, our carbohydrates sources should be unprocessed naturally occurring  complex starch food items.  It is always important to balance nutrition also by  appropriate intake of proteins (mainly plant-based), healthy fats/oils, plenty of fruits and vegetables.   -The American College of Lifestyle Medicine (ACL M) recommends nutrition derived mostly from Whole Food, Plant Predominant Sources example an apple instead of applesauce or apple pie. Eat Plenty of vegetables, Mushrooms, fruits, Legumes, Whole Grains, Nuts, seeds in lieu of processed meats, processed snacks/pastries red meat, poultry, eggs.  Use only water or unsweetened tea for hydration.  The College also recommends the need to stay away from risky substances including alcohol, smoking; obtaining 7-9 hours of restorative sleep, at least 150 minutes of moderate intensity exercise weekly, importance of healthy social connections, and being mindful of stress and seek help when it is overwhelming.    -Sticking to a routine mealtime to eat 3 meals a day and avoiding unnecessary snacks is shown to have a big role in weight control. Under normal circumstances, the only time we burn stored energy is when we are hungry, so allow  some hunger to take place- hunger means no food between appropriate meal times, only water.  It is not advisable to starve.   -It is better to avoid simple carbohydrates including:  Cakes, Sweet Desserts, Ice Cream, Soda (diet and regular), Sweet Tea, Candies, Chips, Cookies, Store Bought Juices, Alcohol in Excess of  1-2 drinks a day, Lemonade,  Artificial Sweeteners, Doughnuts, Coffee Creamers, "Sugar-free" Products, etc, etc.  This is not a complete list.....    -Consulting with certified diabetes educators is proven to provide you with the most accurate and current information on diet.  Also, you may be  interested in discussing diet options/exchanges , we can schedule a visit with Mallory Arellano, RDN, CDE for individualized nutrition education.  -Exercise: If you are able: 30 -60 minutes a day ,4 days a week, or 150 minutes of moderate intensity exercise weekly.    The longer the better if tolerated.  Combine stretch, strength, and aerobic activities.  If you were told in the past that you have high risk for cardiovascular diseases, or if you are currently symptomatic, you may seek evaluation by your heart doctor prior to initiating moderate to intense exercise programs.                                  Additional Care Considerations for Diabetes/Prediabetes   -Diabetes  is a chronic disease.  The most important care consideration is regular follow-up with your diabetes care provider with the goal being avoiding or delaying its complications and to take advantage of advances in medications and technology.  If appropriate actions are taken early enough, type 2 diabetes can even be   reversed.  Seek information from the right source.  - Whole Food, Plant Predominant Nutrition is highly recommended: Eat Plenty of vegetables, Mushrooms, fruits, Legumes, Whole Grains, Nuts, seeds in lieu of processed meats, processed snacks/pastries red meat, poultry, eggs as recommended by American College of  Lifestyle Medicine (ACLM).  -Type 2 diabetes is known to coexist with other important comorbidities such as high blood pressure and high cholesterol.  It is critical to control not only the  diabetes but also the high blood pressure and high cholesterol to minimize and delay the risk of complications including coronary artery disease, stroke, amputations, blindness, etc.  The good news is that this diet recommendation for type 2 diabetes is also very helpful for managing high cholesterol and high blood blood pressure.  - Studies showed that people with diabetes will benefit from a class of medications known as ACE inhibitors and statins.  Unless there are specific reasons not to be on these medications, the standard of care is to consider getting one from these groups of medications at an optimal doses.  These medications are generally considered safe and proven to help protect the heart and the kidneys.    - People with diabetes are encouraged to initiate and maintain regular follow-up with eye doctors, foot doctors, dentists , and if necessary heart and kidney doctors.     - It is highly recommended that people with diabetes quit smoking or stay away from smoking, and get yearly  flu vaccine and pneumonia vaccine at least every 5 years.  See above for additional recommendations on exercise, sleep, stress management , and healthy social connections.      

## 2021-12-16 ENCOUNTER — Encounter: Payer: Self-pay | Admitting: Nutrition

## 2022-01-01 ENCOUNTER — Other Ambulatory Visit: Payer: Self-pay | Admitting: Cardiovascular Disease

## 2022-01-01 ENCOUNTER — Telehealth: Payer: Self-pay | Admitting: "Endocrinology

## 2022-01-01 ENCOUNTER — Other Ambulatory Visit: Payer: Self-pay | Admitting: "Endocrinology

## 2022-01-01 MED ORDER — FLUCONAZOLE 150 MG PO TABS: 150.0000 mg | ORAL_TABLET | Freq: Once | ORAL | 0 refills | Status: AC

## 2022-01-01 NOTE — Telephone Encounter (Signed)
Pt left a VM asking if you could cal her back in regards to some medication that Dr Dorris Fetch gave her.

## 2022-01-01 NOTE — Telephone Encounter (Signed)
Left a message requesting pt return call to the office. ?

## 2022-01-01 NOTE — Telephone Encounter (Signed)
Pt called stating since starting Farxiga she has been experiencing vaginal itching. Pt requested a Rx for something to treat the itching.

## 2022-01-02 NOTE — Telephone Encounter (Signed)
Left a message making pt aware. 

## 2022-01-02 NOTE — Telephone Encounter (Signed)
What dose of diflucan do you want to prescribe pt.

## 2022-01-05 ENCOUNTER — Emergency Department
Admission: EM | Admit: 2022-01-05 | Discharge: 2022-01-05 | Disposition: A | Discharge status: Home / Self Care | Payer: Medicare HMO | Attending: Emergency Medicine | Admitting: Emergency Medicine

## 2022-01-05 ENCOUNTER — Emergency Department: Payer: Medicare HMO

## 2022-01-05 ENCOUNTER — Other Ambulatory Visit: Payer: Self-pay

## 2022-01-05 DIAGNOSIS — R778 Other specified abnormalities of plasma proteins: Secondary | ICD-10-CM | POA: Insufficient documentation

## 2022-01-05 DIAGNOSIS — Z7901 Long term (current) use of anticoagulants: Secondary | ICD-10-CM | POA: Diagnosis not present

## 2022-01-05 DIAGNOSIS — I208 Other forms of angina pectoris: Secondary | ICD-10-CM

## 2022-01-05 DIAGNOSIS — M25512 Pain in left shoulder: Secondary | ICD-10-CM | POA: Diagnosis not present

## 2022-01-05 DIAGNOSIS — R42 Dizziness and giddiness: Secondary | ICD-10-CM | POA: Diagnosis not present

## 2022-01-05 DIAGNOSIS — R944 Abnormal results of kidney function studies: Secondary | ICD-10-CM | POA: Diagnosis not present

## 2022-01-05 DIAGNOSIS — M25461 Effusion, right knee: Secondary | ICD-10-CM | POA: Insufficient documentation

## 2022-01-05 DIAGNOSIS — Y92410 Unspecified street and highway as the place of occurrence of the external cause: Secondary | ICD-10-CM | POA: Insufficient documentation

## 2022-01-05 DIAGNOSIS — R001 Bradycardia, unspecified: Secondary | ICD-10-CM | POA: Diagnosis not present

## 2022-01-05 DIAGNOSIS — M25511 Pain in right shoulder: Secondary | ICD-10-CM | POA: Diagnosis not present

## 2022-01-05 DIAGNOSIS — M25561 Pain in right knee: Secondary | ICD-10-CM | POA: Insufficient documentation

## 2022-01-05 LAB — HEPATIC FUNCTION PANEL
ALT: 19 U/L (ref 0–44)
AST: 23 U/L (ref 15–41)
Albumin: 3.9 g/dL (ref 3.5–5.0)
Alkaline Phosphatase: 48 U/L (ref 38–126)
Bilirubin, Direct: <0.1 mg/dL (ref 0.0–0.2)
Total Bilirubin: 0.4 mg/dL (ref 0.3–1.2)
Total Protein: 7 g/dL (ref 6.5–8.1)

## 2022-01-05 LAB — TROPONIN I (HIGH SENSITIVITY)
Troponin I (High Sensitivity): 42 ng/L — ABNORMAL HIGH (ref <18)
Troponin I (High Sensitivity): 36 ng/L — ABNORMAL HIGH (ref <18)

## 2022-01-05 LAB — CBC WITH DIFFERENTIAL/PLATELET
Abs Immature Granulocytes: 0.02 10*3/uL (ref 0.00–0.07)
Basophils Absolute: 0.1 10*3/uL (ref 0.0–0.1)
Basophils Relative: 1 %
Eosinophils Absolute: 0.2 10*3/uL (ref 0.0–0.5)
Eosinophils Relative: 3 %
HCT: 44.4 % (ref 36.0–46.0)
Hemoglobin: 14.1 g/dL (ref 12.0–15.0)
Immature Granulocytes: 0 %
Lymphocytes Relative: 33 %
Lymphs Abs: 2.6 10*3/uL (ref 0.7–4.0)
MCH: 27.8 pg (ref 26.0–34.0)
MCHC: 31.8 g/dL (ref 30.0–36.0)
MCV: 87.6 fL (ref 80.0–100.0)
Monocytes Absolute: 0.5 10*3/uL (ref 0.1–1.0)
Monocytes Relative: 7 %
Neutro Abs: 4.3 10*3/uL (ref 1.7–7.7)
Neutrophils Relative %: 56 %
Platelets: 349 10*3/uL (ref 150–400)
RBC: 5.07 MIL/uL (ref 3.87–5.11)
RDW: 13 % (ref 11.5–15.5)
WBC: 7.7 10*3/uL (ref 4.0–10.5)
nRBC: 0 % (ref 0.0–0.2)

## 2022-01-05 LAB — BASIC METABOLIC PANEL
Anion gap: 10 (ref 5–15)
BUN: 21 mg/dL (ref 8–23)
CO2: 29 mmol/L (ref 22–32)
Calcium: 9.6 mg/dL (ref 8.9–10.3)
Chloride: 102 mmol/L (ref 98–111)
Creatinine, Ser: 1.03 mg/dL — ABNORMAL HIGH (ref 0.44–1.00)
GFR, Estimated: 55 mL/min — ABNORMAL LOW (ref >60)
Glucose, Bld: 127 mg/dL — ABNORMAL HIGH (ref 70–99)
Potassium: 3.7 mmol/L (ref 3.5–5.1)
Sodium: 141 mmol/L (ref 135–145)

## 2022-01-05 LAB — BRAIN NATRIURETIC PEPTIDE: B Natriuretic Peptide: 131.1 pg/mL — ABNORMAL HIGH (ref 0.0–100.0)

## 2022-01-05 LAB — LIPASE, BLOOD: Lipase: 44 U/L (ref 11–51)

## 2022-01-05 MED ORDER — ACETAMINOPHEN 500 MG PO TABS
1000.0000 mg | ORAL_TABLET | Freq: Once | ORAL | Status: AC
  Administered 2022-01-05: 1000 mg via ORAL
  Filled 2022-01-05: qty 2

## 2022-01-05 NOTE — Discharge Instructions (Addendum)
Her work-up was reassuring and she can take Tylenol 1 g every 8 hours to help with pain and call the orthopedic doctor to make follow-up for her knee x-ray that showed an effusion.  Return to the ER if she develops increasing pain discoloration of the leg, abdominal pain, recurrent chest pain or any other concerns.
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
## 2022-01-05 NOTE — ED Notes (Signed)
Ace wrap applied to right knee. Pt educated on ace wrap use and care. CMS intact following application of ace wrap.
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
## 2022-01-10 ENCOUNTER — Other Ambulatory Visit: Payer: Self-pay

## 2022-01-10 ENCOUNTER — Emergency Department
Admission: EM | Admit: 2022-01-10 | Discharge: 2022-01-10 | Disposition: A | Discharge status: Home / Self Care | Payer: Medicare HMO | Attending: Emergency Medicine | Admitting: Emergency Medicine

## 2022-01-10 DIAGNOSIS — E119 Type 2 diabetes mellitus without complications: Secondary | ICD-10-CM | POA: Insufficient documentation

## 2022-01-10 DIAGNOSIS — J45909 Unspecified asthma, uncomplicated: Secondary | ICD-10-CM | POA: Insufficient documentation

## 2022-01-10 DIAGNOSIS — T7840XA Allergy, unspecified, initial encounter: Secondary | ICD-10-CM | POA: Insufficient documentation

## 2022-01-10 DIAGNOSIS — I10 Essential (primary) hypertension: Secondary | ICD-10-CM | POA: Diagnosis not present

## 2022-01-10 MED ORDER — DIPHENHYDRAMINE HCL 25 MG PO CAPS
25.0000 mg | ORAL_CAPSULE | Freq: Once | ORAL | Status: AC
  Administered 2022-01-10: 25 mg via ORAL
  Filled 2022-01-10: qty 1

## 2022-01-10 MED ORDER — FAMOTIDINE 20 MG PO TABS: 20.0000 mg | ORAL_TABLET | Freq: Two times a day (BID) | ORAL | 11 refills | Status: AC

## 2022-01-10 MED ORDER — PREDNISONE 20 MG PO TABS
60.0000 mg | ORAL_TABLET | Freq: Once | ORAL | Status: AC
  Administered 2022-01-10: 60 mg via ORAL
  Filled 2022-01-10: qty 3

## 2022-01-10 MED ORDER — DIPHENHYDRAMINE HCL 25 MG PO CAPS: 25.0000 mg | ORAL_CAPSULE | Freq: Four times a day (QID) | ORAL | 0 refills | Status: AC | PRN

## 2022-01-10 MED ORDER — FAMOTIDINE 20 MG PO TABS
40.0000 mg | ORAL_TABLET | Freq: Once | ORAL | Status: AC
  Administered 2022-01-10: 40 mg via ORAL
  Filled 2022-01-10: qty 2

## 2022-01-10 NOTE — Discharge Instructions (Addendum)
-  Take the famotidine and diphenhydramine as prescribed.  Do not take the hydroxyzine while you are taking the diphenhydramine.  -Follow-up with your primary care provider in regards to possibly finding a new medication that does not cause the side effects.  -Return to the emergency department anytime if you begin to experience any new or worsening symptoms.

## 2022-01-10 NOTE — ED Triage Notes (Signed)
Pt was prescribed farxiga 2 weeks ago and began having an allergic reaction 3 am today with a hives, itching and burning sensation. Pt states she started a dry cough as well 2 weeks ago.

## 2022-01-10 NOTE — ED Provider Notes (Signed)
[facility] Provider Note    Event Date/Time   First MD Initiated Contact with Patient 01/10/22 2146     (approximate)   History   Chief Complaint Allergic Reaction   HPI Mallory Arellano is a 81 y.o. female, history of diabetes, hypertension, GERD, atrial fibrillation, asthma, presents to the emergency department for evaluation of suspected allergic reaction.  Patient states that she recently began taking Farxiga approximately 2 weeks ago, for which she has developed a dry cough.  She has now additionally began to experience a rash on her lower extremities, as well as some parts of her face/neck.  She states that it is intensely itchy.  She has been applying hydrocortisone cream with minimal relief.  She was told by her doctor that he is concerned for anaphylaxis and a report to the emergency department.  Denies fever/chills, shortness of breath, tongue swelling, throat swelling, dysphagia, nausea/vomiting, diarrhea, vertigo, headache, or dizziness/lightheadedness.  History Limitations: No limitations.        Physical Exam  Triage Vital Signs: ED Triage Vitals  Enc Vitals Group     BP 01/10/22 2125 (!) 155/75     Pulse Rate 01/10/22 2125 71     Resp 01/10/22 2125 17     Temp 01/10/22 2125 98 F (36.7 C)     Temp Source 01/10/22 2125 Oral     SpO2 01/10/22 2125 97 %     Weight 01/10/22 2126 177 lb (80.3 kg)     Height 01/10/22 2126 '5\' 6"'$  (1.676 m)     Head Circumference --      Peak Flow --      Pain Score 01/10/22 2126 0     Pain Loc --      Pain Edu? --      Excl. in [place]? --     Most recent vital signs: Vitals:   01/10/22 2125  BP: (!) 155/75  Pulse: 71  Resp: 17  Temp: 98 F (36.7 C)  SpO2: 97%    General: Awake, NAD.  Skin: Warm, dry. No rashes or lesions.  Eyes: PERRL. Conjunctivae normal.  CV: Good peripheral perfusion.  Resp: Normal effort.  Lung sounds are clear bilaterally. Abd: Soft, non-tender. No distention.  Neuro: At  baseline. No gross neurological deficits.   Focused Exam: Maculopapular, splotchy erythematous rash on the lower extremities, below the knees.  She additionally has some erythematous spots on the right side of her face and neck.  No evidence of any tongue swelling.  Physical Exam    ED Results / Procedures / Treatments  Labs (all labs ordered are listed, but only abnormal results are displayed) Labs Reviewed - No data to display   EKG N/A.   RADIOLOGY  ED Provider Interpretation: N/A.  No results found.  PROCEDURES:  Critical Care performed: N/A.  Procedures    MEDICATIONS ORDERED IN ED: Medications  famotidine (PEPCID) tablet 40 mg (40 mg Oral Given 01/10/22 2225)  diphenhydrAMINE (BENADRYL) capsule 25 mg (25 mg Oral Given 01/10/22 2224)  predniSONE (DELTASONE) tablet 60 mg (60 mg Oral Given 01/10/22 2225)     IMPRESSION / MDM / ASSESSMENT AND PLAN / ED COURSE  I reviewed the triage vital signs and the nursing notes.                              Differential diagnosis includes, but is not limited to, viral exanthem, drug rash,  allergic reaction, contact dermatitis, insect bites  Assessment/Plan Patient presents with erythematous maculopapular rash on the lower extremities and dry cough x2 weeks since starting farxiga.   Her symptoms do appear consistent with common side effects of initiating for farxiga.  No evidence of anaphylaxis at this time.  Physical exam is unimpressive.  Vitals are within normal limits.  We will provide her with a one-time dose of prednisone, as well as diphenhydramine and famotidine.  We will provide her with a short-term prescription for diphenhydramine and famotidine to take at home as well.  Advised her to follow-up with her primary care provider in regards to finding an alternative medication to transition to a will hopefully not cause the side effects.  Will discharge  Provided the patient with anticipatory guidance, return precautions,  and educational material. Encouraged the patient to return to the emergency department at any time if they begin to experience any new or worsening symptoms. Patient expressed understanding and agreed with the plan.   Patient's presentation is most consistent with acute, uncomplicated illness.       FINAL CLINICAL IMPRESSION(S) / ED DIAGNOSES   Final diagnoses:  Allergic reaction, initial encounter     Rx / DC Orders   ED Discharge Orders          Ordered    famotidine (PEPCID) 20 MG tablet  2 times daily        01/10/22 2250    diphenhydrAMINE (BENADRYL) 25 mg capsule  Every 6 hours PRN        01/10/22 2250             Note:  This document was prepared using Dragon voice recognition software and may include unintentional dictation errors.   Teodoro Spray, [place] 01/10/22 2302    Nena Polio, MD 01/11/22 [phone]

## 2022-01-14 ENCOUNTER — Telehealth: Payer: Self-pay | Admitting: "Endocrinology

## 2022-01-14 NOTE — Telephone Encounter (Signed)
Left a message requesting pt return call to the office. ?

## 2022-01-14 NOTE — Telephone Encounter (Signed)
New message   Pt c/o medication issue:  1. Name of Medication: dapagliflozin propanediol (FARXIGA) 5 MG TABS tablet  2. How are you currently taking this medication (dosage and times per day)? Once a day   3. Are you having a reaction (difficulty breathing--STAT)? No   4. What is your medication issue? Bad reaction end up in the hospital - patient is asking for prescription for itching inside / out   5. [facility]  [place]  [phone]  Patient is asking to speak with MD

## 2022-01-15 NOTE — Telephone Encounter (Signed)
Discussed with pt, understanding voiced. She stated she is experiencing itching around her ankles and arms.

## 2022-01-15 NOTE — Telephone Encounter (Signed)
Left a message requesting pt return call to the office. ?

## 2022-01-20 NOTE — Telephone Encounter (Signed)
Pt left a Vm stating she is still having the itching. She said the benadryl is not working and that she has called the company of this medication and they said all her symptoms are side effects. She said this has caused her a lot of complications. She would like to speak with Dr Dorris Fetch.

## 2022-01-21 NOTE — Telephone Encounter (Signed)
Called pt, she said she will call back for an appt

## 2022-01-23 ENCOUNTER — Ambulatory Visit (INDEPENDENT_AMBULATORY_CARE_PROVIDER_SITE_OTHER): Payer: Medicare HMO | Admitting: "Endocrinology

## 2022-01-23 ENCOUNTER — Encounter: Payer: Self-pay | Admitting: "Endocrinology

## 2022-01-23 VITALS — BP 132/76 | HR 52 | Ht 66.0 in | Wt 184.4 lb

## 2022-01-23 DIAGNOSIS — I10 Essential (primary) hypertension: Secondary | ICD-10-CM

## 2022-01-23 DIAGNOSIS — E1169 Type 2 diabetes mellitus with other specified complication: Secondary | ICD-10-CM

## 2022-01-23 DIAGNOSIS — E559 Vitamin D deficiency, unspecified: Secondary | ICD-10-CM | POA: Diagnosis not present

## 2022-01-23 DIAGNOSIS — E782 Mixed hyperlipidemia: Secondary | ICD-10-CM

## 2022-01-23 NOTE — Progress Notes (Signed)
01/23/2022, 9:19 AM  Endocrinology follow-up note   Subjective:    Patient ID: Mallory Arellano, female    DOB: 11-22-1940.  Kieanna A Eustache is being seen in follow-up after she was seen in consultation for management of currently uncontrolled symptomatic diabetes.  Her recent labs also showed severe dyslipidemia, vitamin D deficiency. She did not tolerate [place] she was given causing yeast infection as well as rash on her ankles.   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   Procedure: TOTAL HIP ARTHROPLASTY ANTERIOR APPROACH;  Surgeon: Hessie Knows, MD;  Location: [facility] ORS;  Service: Orthopedics;  Laterality: Right;   WISDOM TOOTH EXTRACTION      Social History   Socioeconomic History   Marital status: Widowed    Spouse name: Not on file   Number of children: Not on file   Years of education: Not on file   Highest education level: Not on file  Occupational History   Not on file  Tobacco Use   Smoking status: Never   Smokeless tobacco: Never  Vaping Use   Vaping Use: Never used  Substance and Sexual Activity   Alcohol use: Not Currently    Alcohol/week: 0.0 standard drinks of alcohol   Drug use: No   Sexual activity: Not Currently    Partners: Male    Birth control/protection: Post-menopausal  Other Topics Concern   Not on file  Social History Narrative   Not on  file   Social Determinants of Health   Financial Resource Strain: Not on file  Food Insecurity: Not on file  Transportation Needs: Not on file  Physical Activity: Not on file  Stress: Not on file  Social Connections: Not on file    Family History  Problem Relation Age of Onset   Diabetes Mother    Hypertension Mother    Hypertension Brother    Diabetes Brother    Other Father        "natural causes"    Outpatient Encounter Medications as of 01/23/2022  Medication Sig   acetaminophen-codeine (TYLENOL #3) 300-30 MG tablet Take 1-2 tablets by mouth every 4 (four) hours as needed for moderate pain.   Blood Glucose Monitoring Suppl (ACCU-CHEK GUIDE ME) w/Device KIT 1 Piece by Does not apply route as directed.   budesonide-formoterol (SYMBICORT) 160-4.5 MCG/ACT inhaler Inhale 2 puffs into the lungs 2 (two) times daily.   chlorthalidone (HYGROTON) 25 MG tablet Take 25 mg by mouth daily.   Cholecalciferol 50 MCG (2000 UT) CAPS Take  1 capsule (2,000 Units total) by mouth daily with breakfast.   diclofenac Sodium (VOLTAREN) 1 % GEL Apply 2 g topically 2 (two) times daily.   diphenhydrAMINE (BENADRYL) 25 mg capsule Take 1 capsule (25 mg total) by mouth every 6 (six) hours as needed.   ezetimibe (ZETIA) 10 MG tablet Take 1 tablet (10 mg total) by mouth daily. PLEASE SCHEDULE OFFICE VISIT FOR FURTHER REFILLS. THANK YOU!   famotidine (PEPCID) 20 MG tablet Take 1 tablet (20 mg total) by mouth 2 (two) times daily.   fexofenadine (ALLEGRA) 180 MG tablet Take 180 mg by mouth daily.   flecainide (TAMBOCOR) 50 MG tablet Take 1 tablet (50 mg total) by mouth every 12 (twelve) hours.   fluticasone (FLONASE) 50 MCG/ACT nasal spray Place 2 sprays into both nostrils daily.   furosemide (LASIX) 20 MG tablet Take 1 tablet (20 mg total) by mouth daily as needed for edema.   glipiZIDE (GLUCOTROL XL) 5 MG 24 hr tablet Take 1 tablet (5 mg total) by mouth daily with breakfast.   glucose blood (ACCU-CHEK GUIDE)  test strip Use as instructed   hydrOXYzine (ATARAX/VISTARIL) 25 MG tablet Take 25 mg by mouth 3 (three) times daily as needed for itching.    levalbuterol (XOPENEX) 1.25 MG/3ML nebulizer solution Take 1.25 mg by nebulization every 6 (six) hours as needed for wheezing.   metoprolol succinate (TOPROL-XL) 25 MG 24 hr tablet Take 0.5 tablets (12.5 mg total) by mouth daily. Take with or immediately following a meal.   montelukast (SINGULAIR) 10 MG tablet Take 10 mg by mouth daily.   ondansetron (ZOFRAN) 4 MG tablet Take 1 tablet (4 mg total) by mouth every 8 (eight) hours as needed for nausea or vomiting.   pantoprazole (PROTONIX) 40 MG tablet Take 40 mg by mouth 2 (two) times daily.    rivaroxaban (XARELTO) 20 MG TABS tablet Take 20 mg by mouth daily with breakfast.   SUMAtriptan (IMITREX) 25 MG tablet Take 1 tablet by mouth every 2 (two) hours as needed for migraine.    [DISCONTINUED] dapagliflozin propanediol (FARXIGA) 5 MG TABS tablet Take 1 tablet (5 mg total) by mouth daily before breakfast.   No facility-administered encounter medications on file as of 01/23/2022.    ALLERGIES: Allergies  Allergen Reactions   Atorvastatin     Other reaction(s): Muscle Pain   Losartan Cough   Sitagliptin Swelling    ABDOMINAL SWELLING   Farxiga [Dapagliflozin] Hives   Metformin And Related Other (See Comments)    Stomach swelling   Warfarin Sodium Other (See Comments)    Patient didn't know what happened but states she couldn't take it   Penicillins Itching    Has patient had a PCN reaction causing immediate rash, facial/tongue/throat swelling, SOB or lightheadedness with hypotension: No Has patient had a PCN reaction causing severe rash involving mucus membranes or skin necrosis: No Has patient had a PCN reaction that required hospitalization: No Has patient had a PCN reaction occurring within the last 10 years: No If all of the above answers are "NO", then may proceed with Cephalosporin use.      VACCINATION STATUS: Immunization History  Administered Date(s) Administered   Influenza, High Dose Seasonal PF 02/13/2017   PFIZER Comirnaty(Gray Top)Covid-19 Tri-Sucrose Vaccine 10/11/2019, 11/01/2019, 05/08/2020, 10/30/2020   Pneumococcal Conjugate-13 01/18/2015    Diabetes She presents for her follow-up diabetic visit. She has type 2 diabetes mellitus. Onset time: She was diagnosed at approximate age of 72 years. Her disease course has  been improving. There are no hypoglycemic associated symptoms. Pertinent negatives for hypoglycemia include no confusion, headaches, pallor or seizures. Associated symptoms include blurred vision, fatigue, polydipsia and polyuria. Pertinent negatives for diabetes include no chest pain and no polyphagia. There are no hypoglycemic complications. Symptoms are improving. Risk factors for coronary artery disease include diabetes mellitus, dyslipidemia, hypertension, obesity, post-menopausal and sedentary lifestyle. Current diabetic treatments: She is on glimepiride 4 mg p.o. daily at breakfast. Her weight is increasing steadily (Lost 4 pounds since last visit.). She is following a generally unhealthy diet. When asked about meal planning, she reported none. She has not had a previous visit with a dietitian. She rarely participates in exercise. Her home blood glucose trend is decreasing steadily. Her breakfast blood glucose range is generally 140-180 mg/dl. Her bedtime blood glucose range is generally 140-180 mg/dl. Her overall blood glucose range is 140-180 mg/dl. (She presents with significant improvement in her glycemic profile averaging 159 for the last 7 days, 168 for the last 14 days, 179 for the last 30 days.  Her original A1c was 9.4% which has improved to 8.8% during her last visit.  She did not document hypoglycemia.  She stopped [place] well than 10 days ago.) An ACE inhibitor/angiotensin II receptor blocker is not being taken.  Hyperlipidemia This is a chronic  problem. The problem is uncontrolled. Exacerbating diseases include diabetes and obesity. Pertinent negatives include no chest pain, myalgias or shortness of breath. Current antihyperlipidemic treatment includes ezetimibe (She does not tolerate statins.). Risk factors for coronary artery disease include dyslipidemia, obesity, a sedentary lifestyle, post-menopausal, diabetes mellitus and hypertension.  Hypertension This is a chronic problem. The problem is controlled. Associated symptoms include blurred vision. Pertinent negatives include no chest pain, headaches, palpitations or shortness of breath. Risk factors for coronary artery disease include dyslipidemia, diabetes mellitus, obesity, post-menopausal state and sedentary lifestyle. Past treatments include beta blockers and diuretics.     Review of Systems  Constitutional:  Positive for fatigue. Negative for chills, fever and unexpected weight change.  HENT:  Negative for trouble swallowing and voice change.   Eyes:  Positive for blurred vision. Negative for visual disturbance.  Respiratory:  Negative for cough, shortness of breath and wheezing.   Cardiovascular:  Positive for leg swelling. Negative for chest pain and palpitations.  Gastrointestinal:  Negative for diarrhea, nausea and vomiting.  Endocrine: Positive for polydipsia and polyuria. Negative for cold intolerance, heat intolerance and polyphagia.  Musculoskeletal:  Negative for arthralgias and myalgias.  Skin:  Negative for color change, pallor, rash and wound.  Neurological:  Negative for seizures and headaches.  Psychiatric/Behavioral:  Negative for confusion and suicidal ideas.     Objective:       01/23/2022    8:16 AM 01/10/2022   11:49 PM 01/10/2022    9:26 PM  Vitals with BMI  Height 5' 6"   5' 6"   Weight 184 lbs 6 oz  177 lbs  BMI 10.17  51.02  Systolic 585 277   Diastolic 76 71   Pulse 52 77     BP 132/76   Pulse (!) 52   Ht 5' 6"  (1.676 m)   Wt 184 lb 6.4 oz  (83.6 kg)   BMI 29.76 kg/m   Wt Readings from Last 3 Encounters:  01/23/22 184 lb 6.4 oz (83.6 kg)  01/10/22 177 lb (80.3 kg)  01/05/22 184 lb (83.5 kg)     No concerning rash, lesions, ulcers on her extremities.  She does have mild pitting  edema on bilateral tibia and pedal areas.  CMP ( most recent) CMP     Component Value Date/Time   NA 141 01/05/2022 1830   NA 143 09/17/2021 0714   K 3.7 01/05/2022 1830   CL 102 01/05/2022 1830   CO2 29 01/05/2022 1830   GLUCOSE 127 (H) 01/05/2022 1830   BUN 21 01/05/2022 1830   BUN 14 09/17/2021 0714   CREATININE 1.03 (H) 01/05/2022 1830   CALCIUM 9.6 01/05/2022 1830   PROT 7.0 01/05/2022 2217   PROT 6.4 09/17/2021 0714   ALBUMIN 3.9 01/05/2022 2217   ALBUMIN 4.2 09/17/2021 0714   AST 23 01/05/2022 2217   ALT 19 01/05/2022 2217   ALKPHOS 48 01/05/2022 2217   BILITOT 0.4 01/05/2022 2217   BILITOT 0.4 09/17/2021 0714   GFRNONAA 55 (L) 01/05/2022 1830   GFRAA >60 08/13/2019 0518     Diabetic Labs (most recent): Lab Results  Component Value Date   HGBA1C 8.8 (A) 12/15/2021   HGBA1C 9.4 (A) 09/11/2021   HGBA1C 8.0 (H) 08/12/2019   MICROALBUR 30 09/29/2021   MICROALBUR <0.7 01/18/2015     Lipid Panel ( most recent) Lipid Panel     Component Value Date/Time   CHOL 204 (H) 09/17/2021 0714   TRIG 151 (H) 09/17/2021 0714   HDL 48 09/17/2021 0714   CHOLHDL 4.3 09/17/2021 0714   CHOLHDL 3 01/18/2015 1033   VLDL 24.6 01/18/2015 1033   LDLCALC 129 (H) 09/17/2021 0714   LABVLDL 27 09/17/2021 0714      Lab Results  Component Value Date   TSH 1.330 09/17/2021   TSH 1.073 08/12/2019   TSH 0.646 02/13/2017   FREET4 1.36 09/17/2021   FREET4 0.99 08/12/2019     25-hydroxy vitamin D  is 25.9 Urine microalbuminuria 30 mg/l  Assessment & Plan:   1. Type 2 diabetes mellitus with other specified complication, without long-term current use of insulin (HCC)  - Shadae A Sipos has currently uncontrolled symptomatic type 2 DM  since  81 years of age.  She presents with significant improvement in her glycemic profile averaging 159 for the last 7 days, 168 for the last 14 days, 179 for the last 30 days.  Her original A1c was 9.4% which has improved to 8.8% during her last visit.  She did not document hypoglycemia.  She stopped [place] well than 10 days ago.   Recent labs reviewed and discussed with her. - I had a long discussion with her about the progressive nature of diabetes and the pathology behind its complications. -her diabetes is complicated by obesity/sedentary life, hyperlipidemia, hypertension and she remains at a high risk for more acute and chronic complications which include CAD, CVA, CKD, retinopathy, and neuropathy. These are all discussed in detail with her.  - I discussed all available options of managing her diabetes including de-escalation of medications.  -She called and sought this care based on a TV segment she saw about lifestyle medicine.  She will benefit tremendously from lifestyle medicine.  I have counseled her on diet  and weight management  by adopting a Whole Food , Plant Predominant  ( WFPP) nutrition as recommended by [employer] of Lifestyle Medicine. Patient is encouraged to switch to  unprocessed or minimally processed  complex starch, adequate protein intake (mainly plant source), minimal liquid fat ( mainly vegetable oils), plenty of fruits, and vegetables. -  she is advised to stick to a routine mealtimes to eat 3 complete meals a day and  snack only when necessary ( to snack only to correct hypoglycemia BG <70 day time or <100 at night).    She presents with significant improvement in her glycemic profile and metabolic profile.  She lost 14 pounds overall, A1c is dropping from 9.4% to 8.8% during her last visit, expected to be better at next next A1c measurement.  Clearly benefiting from lifestyle medicine.  - she acknowledges that there is a room for improvement in her food and  drink choices. - Suggestion is made for her to avoid simple carbohydrates  from her diet including Cakes, Sweet Desserts, Ice Cream, Soda (diet and regular), Sweet Tea, Candies, Chips, Cookies, Store Bought Juices, Alcohol , Artificial Sweeteners,  Coffee Creamer, and "Sugar-free" Products, Lemonade. This will help patient to have more stable blood glucose profile and potentially avoid unintended weight gain.  The following Lifestyle Medicine recommendations according to [place]) were discussed and and offered to patient and she  agrees to start the journey:  A. Whole Foods, Plant-Based Nutrition comprising of fruits and vegetables, plant-based proteins, whole-grain carbohydrates was discussed in detail with the patient.   A list for source of those nutrients were also provided to the patient.  Patient will use only water or unsweetened tea for hydration. B.  The need to stay away from risky substances including alcohol, smoking; obtaining 7 to 9 hours of restorative sleep, at least 150 minutes of moderate intensity exercise weekly, the importance of healthy social connections,  and stress management techniques were discussed. C.  A full color page of  Calorie density of various food groups per pound showing examples of each food groups was provided to the patient.    - she has been scheduled with Jearld Fenton, RDN, CDE for individualized diabetes education.  - I have approached her with the following plan to manage  her diabetes and patient agrees:   - she did bring her meter showing controlled glycemic profile to target despite the fact that she is stopped [place] more than 10 days ago.  She will not need additional treatment, however advised to continue glipizide 5 mg XL p.o. daily at breakfast.   She is encouraged to continue monitoring blood glucose twice a day-daily before breakfast and at bedtime.    - she is encouraged to call clinic for blood glucose  levels less than 70 or above 150 milligrams per DL at fasting 3 days in a week.    -She has several options to treat her diabetes including GLP-1 receptor agonist, or basal insulin if she returns with uncontrolled glycemic profile.  Her best option is lifestyle medicine, see above. -She does not have concerning skin lesions on extremities, encouraged to wear compression socks on both lower extremities to avoid edema.  - Specific targets for  A1c;  LDL, HDL,  and Triglycerides were discussed with the patient.  2) Blood Pressure /Hypertension:   Her blood pressure is controlled to target.  she is advised to continue her current medications including metoprolol 25 mg p.o. daily at breakfast, chlorthalidone 25 mg p.o. daily at breakfast.  Mg p.o. daily with breakfast .  3) Lipids/Hyperlipidemia: Her recent lipid panel showed uncontrolled LDL at 129.   She reports statin intolerance.  The above described WF PB diet will help with dyslipidemia.  She is advised to continue ezetimibe 10 mg p.o. daily.  We will have fasting lipid panel before her next visit.      4)  Weight/Diet:  Body mass index is 29.76 kg/m.  -she has achieved 14 pounds of weight loss since adapting to whole food plant-based diet.  she is  a candidate for weight loss. I discussed with her the fact that loss of 5 - 10% of her  current body weight will have the most impact on her diabetes management.  The above detailed  ACLM recommendations for nutrition, exercise, sleep, social life, avoidance of risky substances, the need for restorative sleep   information will also detailed on discharge instructions.   5) vitamin D deficiency: She is advised to maintain with vitamin D3 2000 units daily.  6) Chronic Care/Health Maintenance:  -she  is not  on ACEI/ARB and Statin medications (will consider on subsequent visits as appropriate ) and  is encouraged to initiate and continue to follow up with Ophthalmology, Dentist,  Podiatrist at least  yearly or according to recommendations, and advised to   stay away from smoking. I have recommended yearly flu vaccine and pneumonia vaccine at least every 5 years; moderate intensity exercise for up to 150 minutes weekly; and  sleep for 7- 9 hours a day.  - she is  advised to maintain close follow up with  her PCP  for primary care needs, as well as her other providers for optimal and coordinated care.   I spent 35 minutes in the care of the patient today including review of labs from [place], Lipids, Thyroid Function, Hematology (current and previous including abstractions from other facilities); face-to-face time discussing  her blood glucose readings/logs, discussing hypoglycemia and hyperglycemia episodes and symptoms, medications doses, her options of short and long term treatment based on the latest standards of care / guidelines;  discussion about incorporating lifestyle medicine;  and documenting the encounter. Risk reduction counseling performed per USPSTF guidelines to reduce  obesity and cardiovascular risk factors.     Please refer to Patient Instructions for Blood Glucose Monitoring and Insulin/Medications Dosing Guide"  in media tab for additional information. Please  also refer to " Patient Self Inventory" in the Media  tab for reviewed elements of pertinent patient history.  Makinsey A Westerlund participated in the discussions, expressed understanding, and voiced agreement with the above plans.  All questions were answered to her satisfaction. she is encouraged to contact clinic should she have any questions or concerns prior to her return visit.   Follow up plan: - Return for Keep Reg. Appt. with Pre-visit Labs.  Glade Lloyd, MD [facility] [address] [place], [place] [address] Phone: [phone]  Fax: [phone]    01/23/2022, 9:19 AM  This note was partially dictated with voice recognition software. Similar sounding words can be  transcribed inadequately or may not  be corrected upon review.

## 2022-01-23 NOTE — Patient Instructions (Signed)

## 2022-02-02 ENCOUNTER — Other Ambulatory Visit: Payer: Self-pay | Admitting: Podiatry

## 2022-02-19 ENCOUNTER — Telehealth: Payer: Self-pay | Admitting: "Endocrinology

## 2022-02-19 ENCOUNTER — Other Ambulatory Visit: Payer: Self-pay | Admitting: "Endocrinology

## 2022-02-19 NOTE — Telephone Encounter (Signed)
She said that she has not been taking it. But is requesting a cream or something to help with itching.

## 2022-02-19 NOTE — Telephone Encounter (Signed)
Patient is still itching from [place]. She said that she has been using hydrocortisone cream and it is not working. Please Advise.

## 2022-03-05 ENCOUNTER — Telehealth: Payer: Self-pay | Admitting: "Endocrinology

## 2022-03-05 DIAGNOSIS — E1169 Type 2 diabetes mellitus with other specified complication: Secondary | ICD-10-CM

## 2022-03-05 MED ORDER — ACCU-CHEK GUIDE VI STRP: ORAL_STRIP | 2 refills | Status: AC

## 2022-03-05 MED ORDER — ACCU-CHEK SOFTCLIX LANCETS MISC: 2 refills | Status: AC

## 2022-03-05 MED ORDER — ACCU-CHEK GUIDE ME W/DEVICE KIT: PACK | 0 refills | Status: AC

## 2022-03-05 NOTE — Telephone Encounter (Signed)
Pt left a VM requesting a glucose monitor to be called in with supplies to [facility] on [address]

## 2022-03-05 NOTE — Telephone Encounter (Signed)
Rxs sent

## 2022-03-08 ENCOUNTER — Other Ambulatory Visit: Payer: Self-pay | Admitting: "Endocrinology

## 2022-03-09 ENCOUNTER — Telehealth: Payer: Self-pay | Admitting: "Endocrinology

## 2022-03-09 NOTE — Telephone Encounter (Signed)
Pt left a VM asking for you to call her

## 2022-03-10 NOTE — Telephone Encounter (Signed)
Pt requested Rx for a glucose monitor. Advised pt we sent in a Rx for a monitor on the 12th to her pharmacy. Pt voiced understanding.

## 2022-03-23 ENCOUNTER — Other Ambulatory Visit: Payer: Self-pay | Admitting: Specialist

## 2022-03-23 DIAGNOSIS — R918 Other nonspecific abnormal finding of lung field: Secondary | ICD-10-CM

## 2022-03-23 DIAGNOSIS — R053 Chronic cough: Secondary | ICD-10-CM

## 2022-03-31 ENCOUNTER — Other Ambulatory Visit: Payer: Self-pay | Admitting: Physical Medicine and Rehabilitation

## 2022-03-31 ENCOUNTER — Other Ambulatory Visit: Payer: Self-pay | Admitting: "Endocrinology

## 2022-03-31 DIAGNOSIS — M5416 Radiculopathy, lumbar region: Secondary | ICD-10-CM

## 2022-04-01 ENCOUNTER — Ambulatory Visit
Admission: RE | Admit: 2022-04-01 | Discharge: 2022-04-01 | Disposition: A | Discharge status: Home / Self Care | Payer: Medicare HMO | Source: Ambulatory Visit | Attending: Specialist | Admitting: Specialist

## 2022-04-01 DIAGNOSIS — R918 Other nonspecific abnormal finding of lung field: Secondary | ICD-10-CM | POA: Diagnosis present

## 2022-04-01 DIAGNOSIS — R053 Chronic cough: Secondary | ICD-10-CM | POA: Insufficient documentation

## 2022-04-16 ENCOUNTER — Ambulatory Visit: Payer: Medicare HMO | Admitting: Nutrition

## 2022-04-21 ENCOUNTER — Encounter: Payer: Medicare HMO | Admitting: Nutrition

## 2022-04-21 ENCOUNTER — Ambulatory Visit: Payer: Medicare HMO | Admitting: "Endocrinology

## 2022-06-19 ENCOUNTER — Encounter: Payer: Self-pay | Admitting: Emergency Medicine

## 2022-06-19 ENCOUNTER — Emergency Department
Admission: EM | Admit: 2022-06-19 | Discharge: 2022-06-19 | Disposition: A | Discharge status: Home / Self Care | Payer: Medicare HMO | Attending: Emergency Medicine | Admitting: Emergency Medicine

## 2022-06-19 ENCOUNTER — Other Ambulatory Visit: Payer: Self-pay

## 2022-06-19 ENCOUNTER — Emergency Department: Payer: Medicare HMO

## 2022-06-19 DIAGNOSIS — M25551 Pain in right hip: Secondary | ICD-10-CM | POA: Insufficient documentation

## 2022-06-19 DIAGNOSIS — M25552 Pain in left hip: Secondary | ICD-10-CM | POA: Diagnosis not present

## 2022-06-19 MED ORDER — KETOROLAC TROMETHAMINE 15 MG/ML IJ SOLN
15.0000 mg | Freq: Once | INTRAMUSCULAR | Status: AC
  Administered 2022-06-19: 15 mg via INTRAMUSCULAR
  Filled 2022-06-19: qty 1

## 2022-06-19 NOTE — ED Notes (Signed)
Pt verbalizes understanding of discharge instructions. Opportunity for questioning and answers were provided. Pt discharged from ED to home.   ? ?
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
## 2022-06-24 ENCOUNTER — Ambulatory Visit
Admission: RE | Admit: 2022-06-24 | Discharge: 2022-06-24 | Disposition: A | Discharge status: Home / Self Care | Payer: Medicare HMO | Source: Ambulatory Visit | Attending: Physical Medicine and Rehabilitation | Admitting: Physical Medicine and Rehabilitation

## 2022-06-24 DIAGNOSIS — M5416 Radiculopathy, lumbar region: Secondary | ICD-10-CM

## 2022-07-09 ENCOUNTER — Encounter: Payer: Self-pay | Admitting: Emergency Medicine

## 2022-07-09 DIAGNOSIS — J45909 Unspecified asthma, uncomplicated: Secondary | ICD-10-CM | POA: Insufficient documentation

## 2022-07-09 DIAGNOSIS — Z7984 Long term (current) use of oral hypoglycemic drugs: Secondary | ICD-10-CM | POA: Insufficient documentation

## 2022-07-09 DIAGNOSIS — Z96641 Presence of right artificial hip joint: Secondary | ICD-10-CM | POA: Diagnosis not present

## 2022-07-09 DIAGNOSIS — E119 Type 2 diabetes mellitus without complications: Secondary | ICD-10-CM | POA: Diagnosis not present

## 2022-07-09 DIAGNOSIS — I10 Essential (primary) hypertension: Secondary | ICD-10-CM | POA: Insufficient documentation

## 2022-07-09 DIAGNOSIS — K0889 Other specified disorders of teeth and supporting structures: Secondary | ICD-10-CM | POA: Diagnosis present

## 2022-07-09 DIAGNOSIS — Z7951 Long term (current) use of inhaled steroids: Secondary | ICD-10-CM | POA: Insufficient documentation

## 2022-07-09 DIAGNOSIS — K029 Dental caries, unspecified: Secondary | ICD-10-CM | POA: Insufficient documentation

## 2022-07-09 NOTE — ED Triage Notes (Signed)
Pt presents via POV with complaints of R lower sided dental pain that started this evening. Hx of dental caries and is aware of some dental work needing to be done to the that side of her mouth but has been neglecting it. Rates the pain 10/10 - pt has been using OTC orajel with minimal improvement. Denies fevers, chills, N/V/D, jaw pain.

## 2022-07-10 ENCOUNTER — Other Ambulatory Visit: Payer: Self-pay

## 2022-07-10 ENCOUNTER — Emergency Department
Admission: EM | Admit: 2022-07-10 | Discharge: 2022-07-10 | Disposition: A | Discharge status: Home / Self Care | Payer: Medicare HMO | Attending: Emergency Medicine | Admitting: Emergency Medicine

## 2022-07-10 DIAGNOSIS — K0889 Other specified disorders of teeth and supporting structures: Secondary | ICD-10-CM

## 2022-07-10 DIAGNOSIS — K029 Dental caries, unspecified: Secondary | ICD-10-CM

## 2022-07-10 MED ORDER — HYDROCODONE-ACETAMINOPHEN 5-325 MG PO TABS: 1.0000 | ORAL_TABLET | Freq: Four times a day (QID) | ORAL | 0 refills | Status: AC | PRN

## 2022-07-10 MED ORDER — CLINDAMYCIN HCL 300 MG PO CAPS: 300.0000 mg | ORAL_CAPSULE | Freq: Three times a day (TID) | ORAL | 0 refills | Status: AC

## 2022-07-10 MED ORDER — CLINDAMYCIN HCL 150 MG PO CAPS
300.0000 mg | ORAL_CAPSULE | Freq: Once | ORAL | Status: AC
  Administered 2022-07-10: 300 mg via ORAL
  Filled 2022-07-10: qty 2

## 2022-07-10 MED ORDER — HYDROCODONE-ACETAMINOPHEN 5-325 MG PO TABS
1.0000 | ORAL_TABLET | Freq: Once | ORAL | Status: AC
  Administered 2022-07-10: 1 via ORAL
  Filled 2022-07-10: qty 1

## 2022-07-10 MED ORDER — LIDOCAINE VISCOUS HCL 2 % MT SOLN
15.0000 mL | Freq: Once | OROMUCOSAL | Status: AC
  Administered 2022-07-10: 15 mL via OROMUCOSAL
  Filled 2022-07-10: qty 15

## 2022-07-10 NOTE — Discharge Instructions (Signed)
1. Take antibiotic as prescribed (Clindamycin 316m three times daily x 7 days). 2. Take pain medicines as needed (Norco #15). 3. Return to the ER for worsening symptoms, persistent vomiting, fever, difficulty breathing or other concerns.

## 2022-07-10 NOTE — ED Notes (Signed)
Patient discharged at this time. Ambulates with independent and steady gait. Wheeled to lobby per request to await ride. Breathing unlabored speaking in full sentences. Verbalized understanding of all discharge, follow up, and medication teaching. Discharged homed with all belongings.

## 2022-07-10 NOTE — ED Provider Notes (Signed)
[facility] Provider Note    Event Date/Time   First MD Initiated Contact with Patient 07/10/22 0013     (approximate)   History   Dental Pain   HPI  Mallory Arellano is a 82 y.o. female who presents to the ED from home with a chief complaint of right lower molar pain which began last evening.  History of dental caries and notes that she has some dental work which needs to be done.  Applied OTC Orajel without significant improvement.  Denies fever/chills, facial swelling, nausea/vomiting or dizziness.     Past Medical History   Past Medical History:  Diagnosis Date   A-fib ([place])    Arthritis    Asthma    Back pain    Diabetes mellitus without complication ([place])    Diabetes mellitus, type II ([place])    Dysrhythmia    Ganglion cyst of foot    GERD (gastroesophageal reflux disease)    Hypercholesteremia    Hypertension    Lower extremity edema    Lumbar radiculitis    Osteoarthritis    Palpitation    Perioral dermatitis    UTI (urinary tract infection)      Active Problem List   Patient Active Problem List   Diagnosis Date Noted   Vitamin D deficiency 09/29/2021   Class 1 obesity due to excess calories with serious comorbidity and body mass index (BMI) of 31.0 to 31.9 in adult 09/11/2021   Primary localized osteoarthritis of right hip 11/11/2017   Symptomatic sinus bradycardia 02/12/2017   Chronic cough 06/18/2015   Plantar fascial fibromatosis 06/18/2015   Preventative health care 01/18/2015   Paroxysmal atrial fibrillation ([place]) 12/30/2014   Diabetes mellitus ([place]) 10/16/2014   Essential hypertension, benign 10/16/2014   Mild persistent chronic asthma without complication 0000000   Allergic rhinitis 10/16/2014   GERD (gastroesophageal reflux disease) 10/16/2014   Mixed hyperlipidemia    Arthritis of knee, degenerative 09/07/2014     Past Surgical History   Past Surgical History:  Procedure Laterality Date   CATARACT  EXTRACTION, BILATERAL     COLONOSCOPY     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 Authorizing Provider  Accu-Chek Softclix Lancets lancets Use to check blood glucose twice daily 03/05/22   Nida, Marella Chimes, MD  acetaminophen-codeine (TYLENOL #3) 300-30 MG tablet Take 1-2 tablets by mouth every 4 (four) hours as needed for moderate pain. 09/18/21   Felipa Furnace, DPM  Blood Glucose Monitoring Suppl (ACCU-CHEK GUIDE ME) w/Device KIT Use to check blood glucose twice daily 03/05/22   Cassandria Anger, MD  budesonide-formoterol [facility]) 160-4.5 MCG/ACT inhaler Inhale 2 puffs into the lungs 2 (two) times daily.    [provider]  chlorthalidone (HYGROTON) 25 MG tablet Take 25 mg by mouth daily. 08/02/21   [provider]  Cholecalciferol (VITAMIN D3) 50 MCG (2000 UT) capsule TAKE 1 CAPSULE (2,000 UNITS TOTAL) BY MOUTH DAILY WITH BREAKFAST. 03/31/22   Cassandria Anger, MD  diclofenac Sodium (VOLTAREN) 1 % GEL Apply 2 g topically 2 (two) times daily.    [provider]  diphenhydrAMINE (BENADRYL) 25 mg capsule Take 1 capsule (25 mg total) by mouth every  6 (six) hours as needed. 01/10/22   Teodoro Spray, PA  ezetimibe (ZETIA) 10 MG tablet Take 1 tablet (10 mg total) by mouth daily. PLEASE SCHEDULE OFFICE VISIT FOR FURTHER REFILLS. THANK YOU! 08/08/21   Minna Merritts, MD  famotidine (PEPCID) 20 MG tablet Take 1 tablet (20 mg total) by mouth 2 (two) times daily. 01/10/22 01/10/23  Teodoro Spray, PA  fexofenadine (ALLEGRA) 180 MG tablet Take 180 mg by mouth daily. 08/14/21   [provider]  flecainide (TAMBOCOR) 50 MG tablet Take 1 tablet (50 mg total) by mouth every 12  (twelve) hours. 08/15/19   Jennye Boroughs, MD  fluticasone (FLONASE) 50 MCG/ACT nasal spray Place 2 sprays into both nostrils daily.    [provider]  furosemide (LASIX) 20 MG tablet Take 1 tablet (20 mg total) by mouth daily as needed for edema. 08/13/19   Jennye Boroughs, MD  glipiZIDE (GLUCOTROL XL) 5 MG 24 hr tablet TAKE 1 TABLET BY MOUTH EVERY DAY WITH BREAKFAST 03/09/22   Cassandria Anger, MD  glucose blood (ACCU-CHEK GUIDE) test strip Use to check blood glucose twice daily 03/05/22   Cassandria Anger, MD  hydrOXYzine (ATARAX/VISTARIL) 25 MG tablet Take 25 mg by mouth 3 (three) times daily as needed for itching.     [provider]  levalbuterol Penne Lash) 1.25 MG/3ML nebulizer solution Take 1.25 mg by nebulization every 6 (six) hours as needed for wheezing.    [provider]  metoprolol succinate (TOPROL-XL) 25 MG 24 hr tablet Take 0.5 tablets (12.5 mg total) by mouth daily. Take with or immediately following a meal. 11/13/20   Gollan, Kathlene November, MD  montelukast (SINGULAIR) 10 MG tablet Take 10 mg by mouth daily. 09/10/21   [provider]  ondansetron (ZOFRAN) 4 MG tablet Take 1 tablet (4 mg total) by mouth every 8 (eight) hours as needed for nausea or vomiting. 08/13/19   Jennye Boroughs, MD  pantoprazole (PROTONIX) 40 MG tablet Take 40 mg by mouth 2 (two) times daily.  09/05/14   [provider]  rivaroxaban (XARELTO) 20 MG TABS tablet Take 20 mg by mouth daily with breakfast.    [provider]  SUMAtriptan (IMITREX) 25 MG tablet Take 1 tablet by mouth every 2 (two) hours as needed for migraine.     [provider]     Allergies  Atorvastatin, Losartan, Sitagliptin, Farxiga [dapagliflozin], Metformin and related, Warfarin sodium, and Penicillins   Family History   Family History  Problem Relation Age of Onset   Diabetes Mother    Hypertension Mother    Hypertension Brother    Diabetes Brother    Other Father         "natural causes"     Physical Exam  Triage Vital Signs: ED Triage Vitals  Enc Vitals Group     BP 07/09/22 2308 (!) 164/78     Pulse Rate 07/09/22 2308 78     Resp 07/09/22 2308 18     Temp 07/09/22 2308 99 F (37.2 C)     Temp Source 07/09/22 2308 Oral     SpO2 07/09/22 2308 96 %     Weight 07/09/22 2306 170 lb (77.1 kg)     Height 07/09/22 2306 5' 6"$  (1.676 m)     Head Circumference --      Peak Flow --      Pain Score 07/09/22 2305 7     Pain Loc --  Pain Edu? --      Excl. in [place]? --     Updated Vital Signs: BP (!) 190/86 (BP Location: Right Arm)   Pulse 74   Temp 99 F (37.2 C) (Oral)   Resp 18   Ht 5' 6"$  (1.676 m)   Wt 77.1 kg   SpO2 100%   BMI 27.44 kg/m    General: Awake, no distress.  CV:  Good peripheral perfusion.  Resp:  Normal effort.  Abd:  No distention.  Other:  Mostly edentulous; single right lower molar with dental caries and tender to palpation with tongue blade.  No intra or extraoral swelling.   ED Results / Procedures / Treatments  Labs (all labs ordered are listed, but only abnormal results are displayed) Labs Reviewed - No data to display   EKG  None   RADIOLOGY None   Official radiology report(s): No results found.   PROCEDURES:  Critical Care performed: No  Procedures   MEDICATIONS ORDERED IN ED: Medications  lidocaine (XYLOCAINE) 2 % viscous mouth solution 15 mL (has no administration in time range)  clindamycin (CLEOCIN) capsule 300 mg (has no administration in time range)  HYDROcodone-acetaminophen (NORCO/VICODIN) 5-325 MG per tablet 1 tablet (has no administration in time range)     IMPRESSION / MDM / ASSESSMENT AND PLAN / ED COURSE  I reviewed the triage vital signs and the nursing notes.                             82 year old female presenting with dentalgia secondary to dental caries.  Will start clindamycin as patient is penicillin allergic, lidocaine rinse, Norco.  Patient will follow-up with  dental clinic.  Strict return precautions given.  Patient verbalizes understanding and agrees with plan of care.  Patient's presentation is most consistent with acute, uncomplicated illness.   FINAL CLINICAL IMPRESSION(S) / ED DIAGNOSES   Final diagnoses:  Toothache  Dental caries     Rx / DC Orders   ED Discharge Orders     None        Note:  This document was prepared using Dragon voice recognition software and may include unintentional dictation errors.   Paulette Blanch, MD 07/10/22 [phone]

## 2022-07-10 NOTE — ED Notes (Signed)
Pt informed EDT that she wanted to check to see if she was in afib - EKG obtained. Pt denies CP, SOB, palpitations, dizziness, and endorses anxiety over the dental pain.

## 2022-08-21 ENCOUNTER — Other Ambulatory Visit: Payer: Self-pay | Admitting: "Endocrinology

## 2022-09-04 ENCOUNTER — Other Ambulatory Visit: Payer: Self-pay | Admitting: "Endocrinology

## 2022-09-27 ENCOUNTER — Other Ambulatory Visit: Payer: Self-pay | Admitting: "Endocrinology

## 2022-10-02 ENCOUNTER — Other Ambulatory Visit: Payer: Self-pay | Admitting: "Endocrinology

## 2022-10-07 ENCOUNTER — Ambulatory Visit: Payer: Medicare HMO | Attending: Orthopedic Surgery | Admitting: Speech Pathology

## 2022-10-07 DIAGNOSIS — R490 Dysphonia: Secondary | ICD-10-CM | POA: Diagnosis present

## 2022-10-13 NOTE — Therapy (Signed)
OUTPATIENT SPEECH LANGUAGE PATHOLOGY  VOICE EVALUATION   Patient Name: Mallory Arellano MRN: [id number] DOB:1941/05/21, 82 y.o., female Today's Date: 10/07/2022  PCP: Drake Leach, PA REFERRING PROVIDER: Gigi Gin, PA   End of Session - 10/08/22 1029     Visit Number 1    Number of Visits 19    Date for SLP Re-Evaluation 12/08/22    Authorization Type [employer]    Progress Note Due on Visit 10    SLP Start Time 1300    SLP Stop Time  1400    SLP Time Calculation (min) 60 min    Activity Tolerance Patient tolerated treatment well             Past Medical History:  Diagnosis Date   A-fib ([facility])    Arthritis    Asthma    Back pain    Diabetes mellitus without complication ([facility])    Diabetes mellitus, type II ([facility])    Dysrhythmia    Ganglion cyst of foot    GERD (gastroesophageal reflux disease)    Hypercholesteremia    Hypertension    Lower extremity edema    Lumbar radiculitis    Osteoarthritis    Palpitation    Perioral dermatitis    UTI (urinary tract infection)    Past Surgical History:  Procedure Laterality Date   CATARACT EXTRACTION, BILATERAL     COLONOSCOPY     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   Procedure: TOTAL HIP ARTHROPLASTY ANTERIOR APPROACH;  Surgeon: Kennedy Bucker, MD;  Location: [facility] ORS;  Service: Orthopedics;  Laterality: Right;   WISDOM TOOTH EXTRACTION     Patient Active Problem List   Diagnosis Date Noted   Vitamin D deficiency 09/29/2021   Class 1 obesity due to excess calories with serious comorbidity and body mass index (BMI) of 31.0 to 31.9 in adult 09/11/2021   Primary localized osteoarthritis of right hip 11/11/2017   Symptomatic sinus bradycardia 02/12/2017   Chronic cough 06/18/2015   Plantar fascial fibromatosis 06/18/2015   Preventative health care 01/18/2015   Paroxysmal atrial fibrillation ([facility]) 12/30/2014   Diabetes mellitus ([facility]) 10/16/2014   Essential hypertension, benign 10/16/2014   Mild persistent  chronic asthma without complication 10/16/2014   Allergic rhinitis 10/16/2014   GERD (gastroesophageal reflux disease) 10/16/2014   Mixed hyperlipidemia    Arthritis of knee, degenerative 09/07/2014    ONSET DATE:  4 months prior to referral; date of referral 09/28/2022  REFERRING DIAG: R49.0 (ICD-10-CM) - Dysphonia   THERAPY DIAG:  Dysphonia  Rationale for Evaluation and Treatment Rehabilitation  SUBJECTIVE:   SUBJECTIVE STATEMENT: Pt pleasant, eager, motivated, appears to be good historian Pt accompanied by: self  PERTINENT HISTORY and DIAGNOSTIC FINDINGS: 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 Pt diagnosed with "uncontrolled acid reflux" (laryngopharyngeal reflux) with increase in omeprazole from 20mg  to 40mg  by ENT, rhinitis, and dysphonia likely "from a combination of post nasal drainage and reflux."  PAIN:  Are you having pain? No   FALLS: Has patient fallen in last 6 months? No, Number of falls: N/A  LIVING ENVIRONMENT: Lives with: lives alone Lives in: House/apartment  PLOF: Independent  PATIENT GOALS    to be able to sing again  OBJECTIVE:  COGNITION: Overall cognitive status: Within functional limits for tasks assessed  SOCIAL HISTORY: Occupation: enjoys Technical brewer intake: suboptimal Caffeine/alcohol intake: minimal  Daily voice use: moderate Environmental risks: None reported Occupational risks: Mallory Arellano Misuse: Speaks excessively, Tension, Speaks without adequate warm-up, Speaks without adequate breath support, and Speaks on residual capacity Phonotraumatic behaviors: Excessive voice use and Excessive voice use during colds/illnesses  PERCEPTUAL VOICE ASSESSMENT: Voice quality: hoarse, breathy, harsh, rough, strained, and vocal fatigue Vocal abuse: habitual throat clearing, abnormal breathing pattern, and  excessive voice use Resonance: normal Respiratory function: clavicular breathing and speaking on residual capacity  OBJECTIVE VOICE ASSESSMENT: Oral reading (passage) loudness average: 77 dB Oral reading (passage) pitch average: 159 Hz Conversational pitch average: 141 Hz Highest dynamic pitch in conversational speech: 214 Hz Lowest dynamic pitch in conversational speech: 74 Hz Conversational pitch range: 141 Hz Conversational loudness average: 70 dB Conversational loudness range: 13 dB Voice quality: hoarse, breathy, harsh, rough, strained, low vocal intensity, and vocal fatigue   ORAL MOTOR EXAMINATION Facial : WFL Lingual: WFL Velum: WFL Mandible: WFL Cough: WFL   PATIENT REPORTED OUTCOME MEASURES (PROM):  VOICE HANDICAP INDEX (VHI)  The Voice Handicap Index is comprised of a series of questions to assess the patient's perception of their voice. It is designed to evaluate the emotional, physical and functional components of the voice problem.  Functional: 0 Physical: 10 Emotional: 9 Total: 19 (Normal mean 8.75, SD =14.97)  z score =  .69 (no significant impact = 0-1.00)   Reflux Symptom Index Hoarseness or a problem with your voice 4 Clearing your throat 4 Excess throat mucous or postnasal drip 5 Difficulty swallowing food, liquids, or pills 0 Coughing after you eat or after lying down 2 Breathing difficulties or choking episodes 0 Troublesome or annoying cough 4 Sensation of something sticking in your throat or a lump in your throat 0 Heartburn, chest pain, indigestion, or stomach acid coming up 4  TOTAL: 23   Normative data suggests that an RSI of greater than or equal to 13 is clinically significant  Therefore, an RSI > 13 may be indicative of significant reflux disease.   TODAY'S TREATMENT:  Skilled treatment session focused on pt's dysphonia goals. SLP facilitated the session by providing the following interventions:  Skilled verbal and written  information provided on phono-traumatic behaviors of throat clearing and coughing - compensatory strategies provided with pt demonstrating good ability to employ during session Pt instructed in reflux precautions, foods to avoid, importance of medication compliance   PATIENT EDUCATION: Education details: results of this assessment, ST POC Person educated: Patient Education method: Explanation, Demonstration, Verbal cues, and Handouts Education comprehension: verbalized understanding, returned demonstration, and needs further education   HOME EXERCISE PROGRAM: Vocal rest (no singing) Reflux precautions     GOALS: Goals reviewed with patient? Yes  SHORT TERM GOALS: Target date: 10 sessions  1. The patient will eliminate phonotraumatic behaviors such as chronic throat clearing, by substituting non-traumatic methods to clear mucus Baseline: Goal status: INITIAL  2.  The patient will demonstrate abdominal breathing patterns and steady release of breath on exhalation to optimize efficiency of voicing and decrease laryngeal hyperfunction.  Baseline:  Goal status: INITIAL    LONG TERM GOALS: Target date: 12/08/2022  The patient will be independent for abdominal breathing and breath support exercises. Baseline:  Goal status: INITIAL  2.  The patient will demonstrate independent understanding of vocal hygiene concepts. Baseline:  Goal status: INITIAL   ASSESSMENT:  CLINICAL IMPRESSION: Patient is a 82 y.o. female who was seen today for a qualitative voice evaluation.  Pt presents with moderate dysphonia that is c/b strained, low  vocal intensity, habitual low pitch, speaking on residual air and phono-traumatic behaviors of habitual throat clearing and cough as well as straining to sing during these time period.    Further excerbated by reproted significant reflux, sleeping with towel at night d/t reflux, heavy use of methal cough drops with reported evaluation by GI but delined  EGD d//t poor health literacy.    OBJECTIVE IMPAIRMENTS include voice disorder. These impairments are limiting patient from effectively communicating at home and in community and returning to singing . Factors affecting potential to achieve goals and functional outcome are severity of impairments. Patient will benefit from skilled SLP services to address above impairments and improve overall function.  REHAB POTENTIAL: Good  PLAN: SLP FREQUENCY: 1-2x/week  SLP DURATION: 8 weeks  PLANNED INTERVENTIONS: SLP instruction and feedback and Patient/family education    Mallory Arellano, M.S., CCC-SLP, Tree surgeon Certified Brain Injury Specialist [facility]  [facility] Rehabilitation Services Office [phone] Ascom [phone] Fax [phone]

## 2022-11-18 ENCOUNTER — Other Ambulatory Visit: Payer: Self-pay | Admitting: "Endocrinology

## 2022-11-22 IMAGING — CR DG CHEST 2V
1 series · 2 of 2 positions shown · non-contrast
Comparison: 09/25/2020

CLINICAL DATA: Asthma.  Worsening shortness of breath.  Wheezing.

EXAM:
CHEST - 2 VIEW

[Series 1: dg chest 2 view · 0.14mm/px · 2 of 2 slices shown]
[im 1/2]
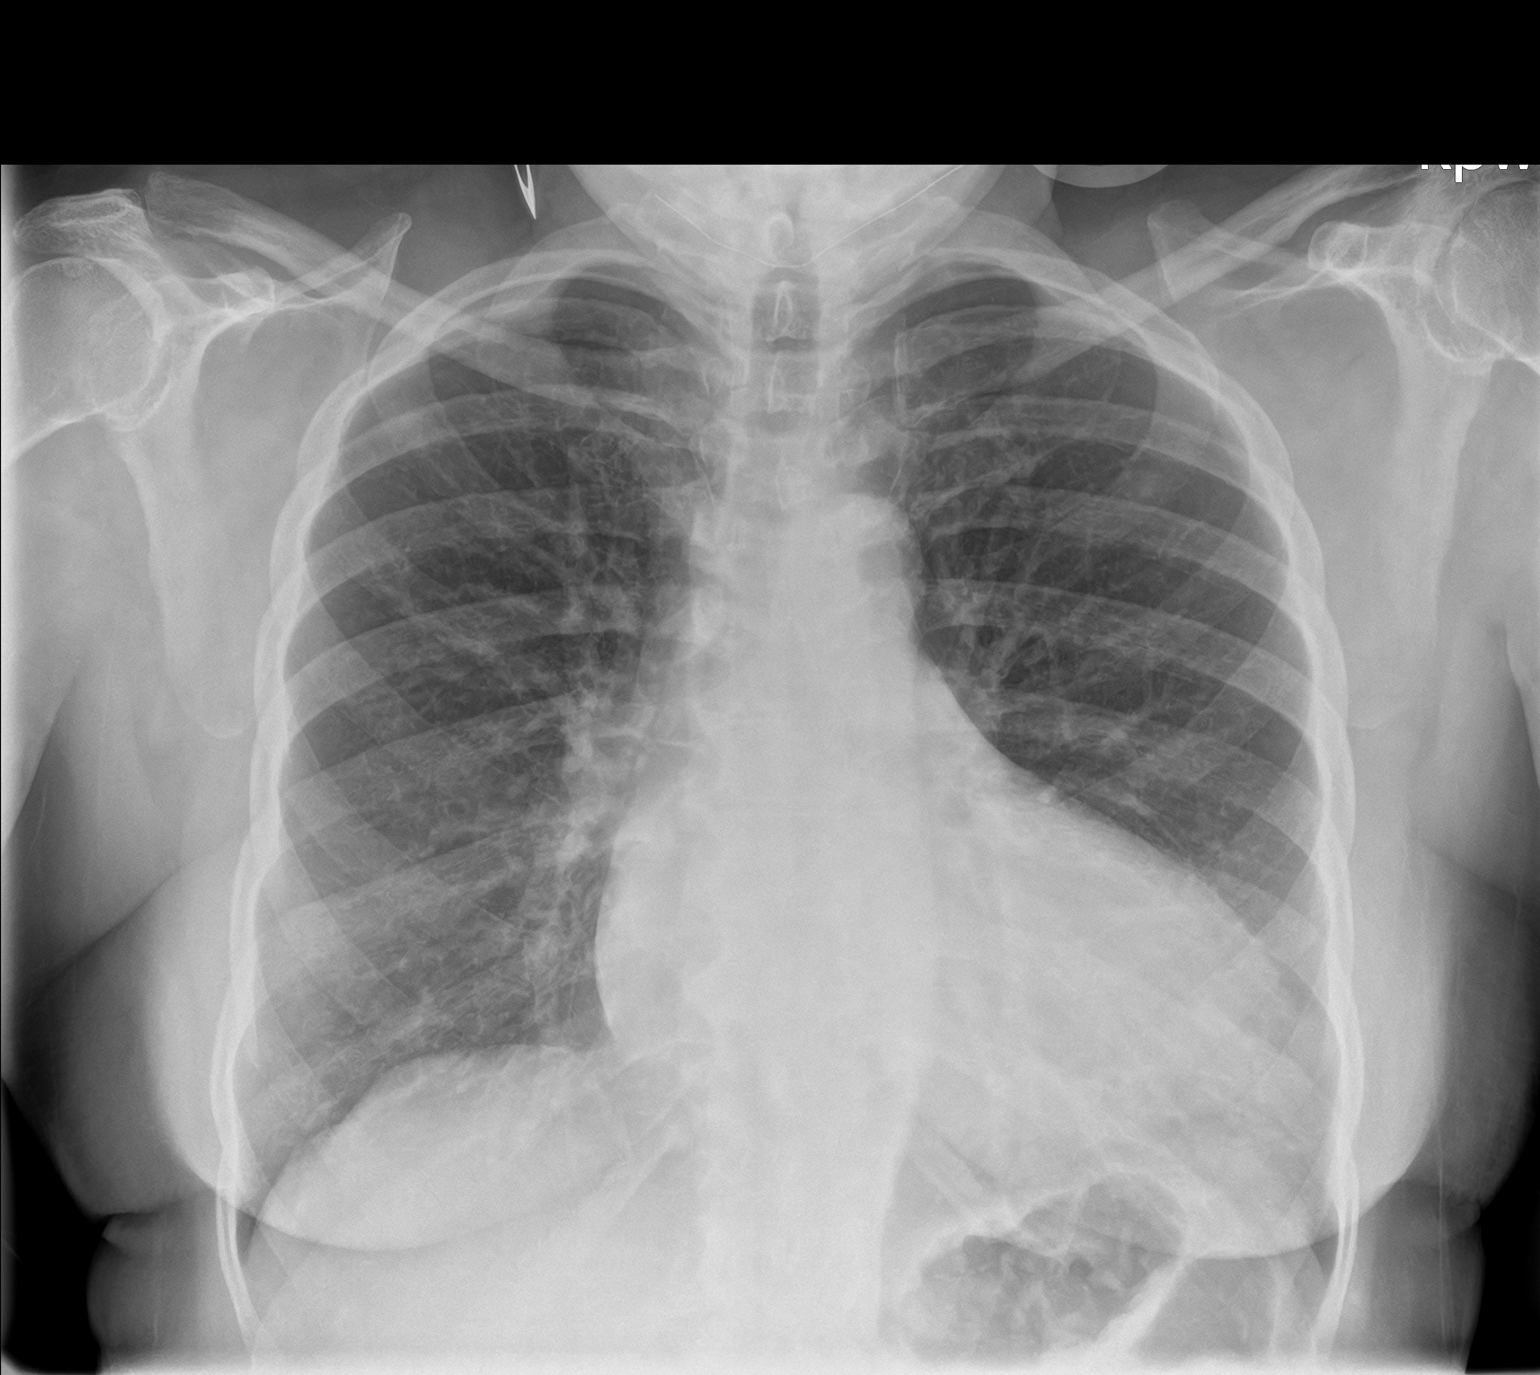
[im 2/2]
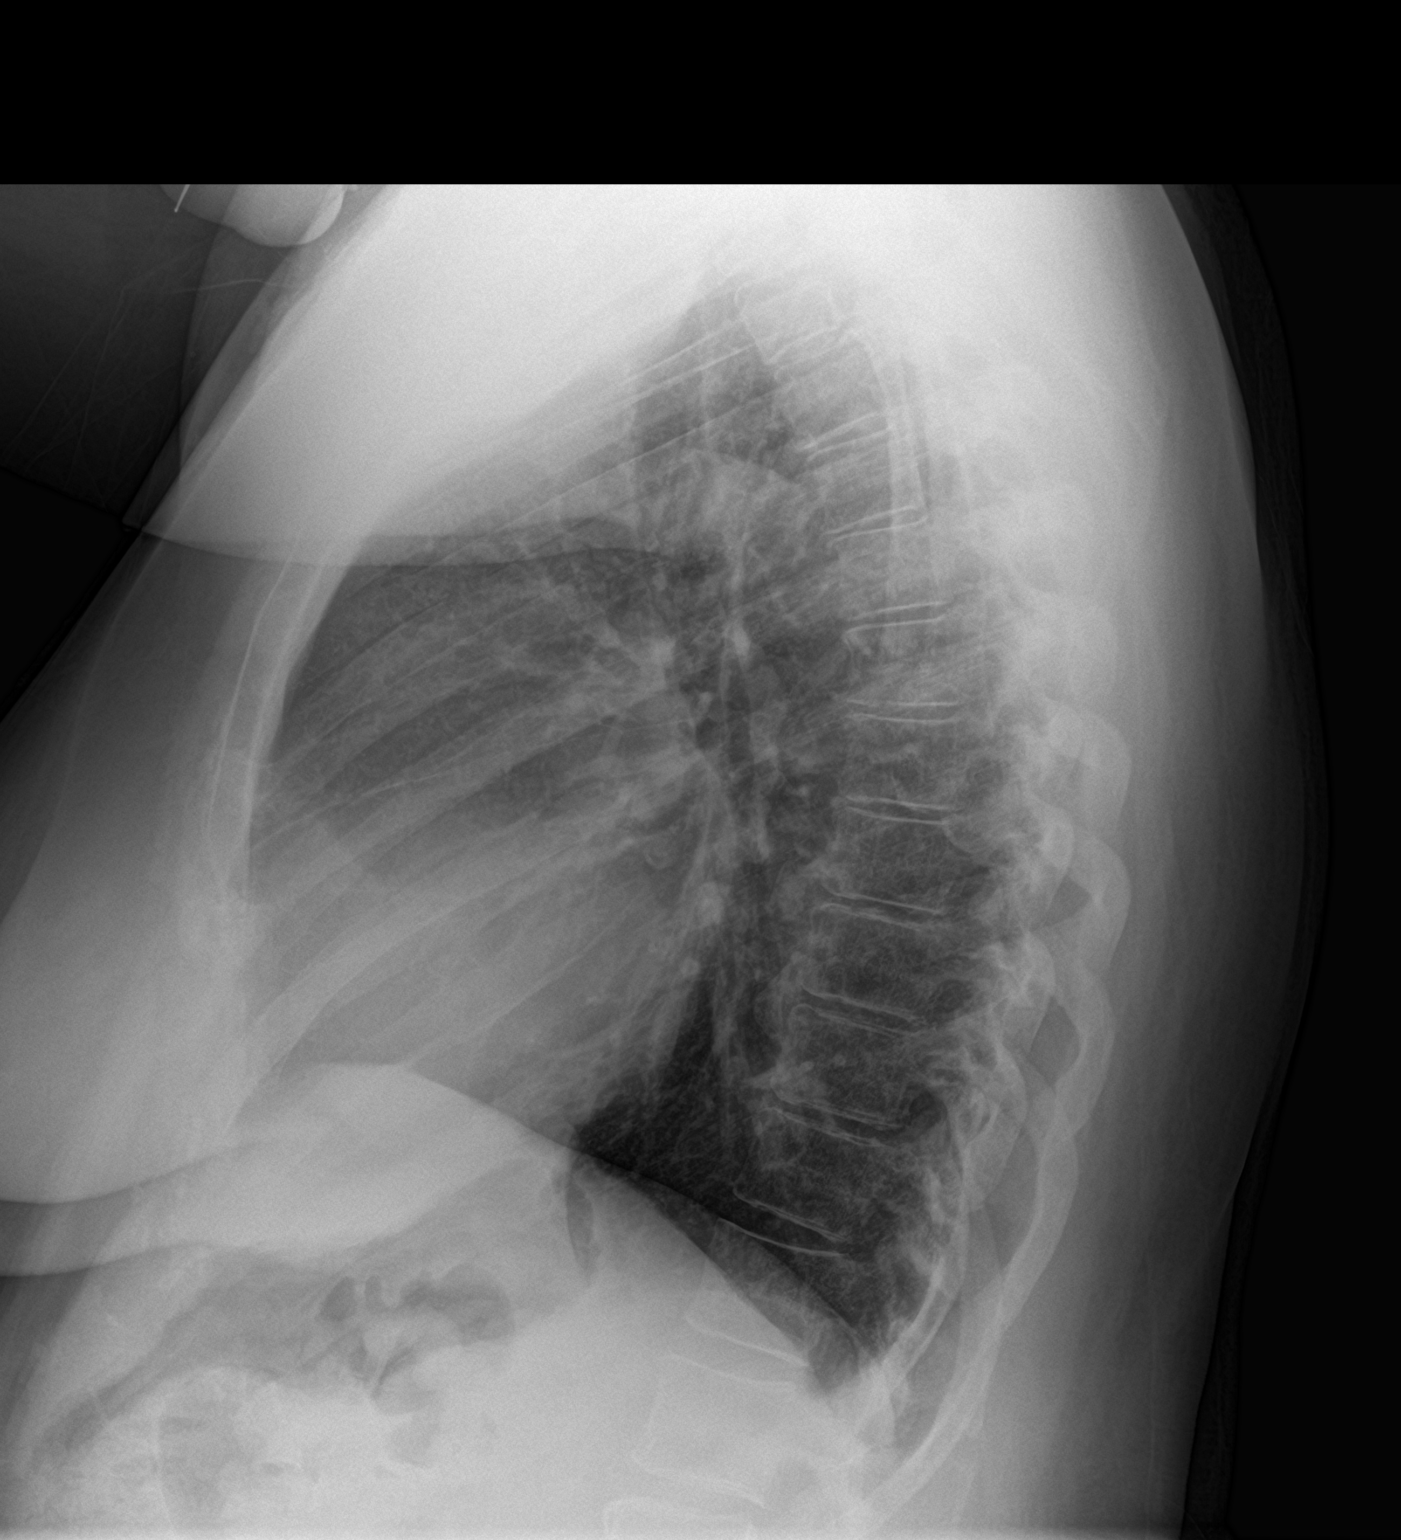

[2 of 2 positions shown; findings below may reference images not displayed]

FINDINGS: Cardiomegaly with left ventricular prominence as seen previously.
Mediastinal shadows are unremarkable. There may be central bronchial
thickening but there is no infiltrate, collapse or effusion. No
significant bone finding.
IMPRESSION: Possible bronchitis. No consolidation or collapse. Cardiomegaly with
left ventricular prominence.

## 2023-01-12 ENCOUNTER — Other Ambulatory Visit: Payer: Self-pay | Admitting: "Endocrinology

## 2023-04-08 ENCOUNTER — Other Ambulatory Visit: Payer: Self-pay | Admitting: Specialist

## 2023-04-08 DIAGNOSIS — R918 Other nonspecific abnormal finding of lung field: Secondary | ICD-10-CM

## 2023-04-13 ENCOUNTER — Ambulatory Visit: Payer: Medicare HMO

## 2023-08-11 ENCOUNTER — Other Ambulatory Visit: Payer: Self-pay | Admitting: Specialist

## 2023-08-11 DIAGNOSIS — R918 Other nonspecific abnormal finding of lung field: Secondary | ICD-10-CM

## 2023-08-14 ENCOUNTER — Emergency Department
Admission: EM | Admit: 2023-08-14 | Discharge: 2023-08-15 | Disposition: A | Discharge status: Home / Self Care | Attending: Emergency Medicine | Admitting: Emergency Medicine

## 2023-08-14 ENCOUNTER — Other Ambulatory Visit: Payer: Self-pay

## 2023-08-14 ENCOUNTER — Emergency Department

## 2023-08-14 DIAGNOSIS — R002 Palpitations: Secondary | ICD-10-CM | POA: Insufficient documentation

## 2023-08-14 DIAGNOSIS — Z7901 Long term (current) use of anticoagulants: Secondary | ICD-10-CM | POA: Diagnosis not present

## 2023-08-14 LAB — BASIC METABOLIC PANEL
Anion gap: 10 (ref 5–15)
BUN: 26 mg/dL — ABNORMAL HIGH (ref 8–23)
CO2: 25 mmol/L (ref 22–32)
Calcium: 8.6 mg/dL — ABNORMAL LOW (ref 8.9–10.3)
Chloride: 102 mmol/L (ref 98–111)
Creatinine, Ser: 1.04 mg/dL — ABNORMAL HIGH (ref 0.44–1.00)
GFR, Estimated: 54 mL/min — ABNORMAL LOW (ref >60)
Glucose, Bld: 211 mg/dL — ABNORMAL HIGH (ref 70–99)
Potassium: 3.3 mmol/L — ABNORMAL LOW (ref 3.5–5.1)
Sodium: 137 mmol/L (ref 135–145)

## 2023-08-14 LAB — CBC
HCT: 43 % (ref 36.0–46.0)
Hemoglobin: 14.2 g/dL (ref 12.0–15.0)
MCH: 28.2 pg (ref 26.0–34.0)
MCHC: 33 g/dL (ref 30.0–36.0)
MCV: 85.3 fL (ref 80.0–100.0)
Platelets: 300 10*3/uL (ref 150–400)
RBC: 5.04 MIL/uL (ref 3.87–5.11)
RDW: 14.1 % (ref 11.5–15.5)
WBC: 9.4 10*3/uL (ref 4.0–10.5)
nRBC: 0 % (ref 0.0–0.2)

## 2023-08-14 LAB — TROPONIN I (HIGH SENSITIVITY): Troponin I (High Sensitivity): 36 ng/L — ABNORMAL HIGH (ref <18)

## 2023-08-14 NOTE — ED Triage Notes (Signed)
 Pt to ED by POV from home with c/o a fluttering feeling in her chest, denies any pain. Pt states she recently started taking metoprolol and has been "feeling funny" ever since. Pt also endorses lightheaded episodes, but hasn't lost consciousness. Arrives A+O, VSS, NADN.

## 2023-08-15 DIAGNOSIS — R002 Palpitations: Secondary | ICD-10-CM | POA: Diagnosis not present

## 2023-08-15 LAB — TSH: TSH: 1.138 u[IU]/mL (ref 0.350–4.500)

## 2023-08-15 LAB — TROPONIN I (HIGH SENSITIVITY): Troponin I (High Sensitivity): 44 ng/L — ABNORMAL HIGH (ref <18)

## 2023-08-15 LAB — MAGNESIUM: Magnesium: 2.6 mg/dL — ABNORMAL HIGH (ref 1.7–2.4)

## 2023-08-15 LAB — T4, FREE: Free T4: 0.85 ng/dL (ref 0.61–1.12)

## 2023-08-15 MED ORDER — POTASSIUM CHLORIDE CRYS ER 20 MEQ PO TBCR
20.0000 meq | EXTENDED_RELEASE_TABLET | Freq: Two times a day (BID) | ORAL | 0 refills | Status: AC
Start: 2023-08-15 — End: 2024-03-16

## 2023-08-15 MED ORDER — POTASSIUM CHLORIDE CRYS ER 20 MEQ PO TBCR
40.0000 meq | EXTENDED_RELEASE_TABLET | Freq: Once | ORAL | Status: AC
  Administered 2023-08-15: 40 meq via ORAL
  Filled 2023-08-15: qty 2

## 2023-08-15 NOTE — Discharge Instructions (Signed)
 You were seen in the ER for your palpitations.  Your testing fortunately did not show an emergency cause for this.  I sent a prescription for a few days of potassium to your pharmacy.  Follow-up with your cardiology team for further evaluation.  Return to the ER for new or worsening symptoms.

## 2023-08-15 NOTE — ED Provider Notes (Signed)
 [facility] Provider Note    Event Date/Time   First MD Initiated Contact with Patient 08/14/23 2355     (approximate)   History   Irregular Heart Beat   HPI  Mallory Arellano is a 83 year old female with history of shortness of breath, palpitations, CAD, T2DM, HTN presenting to the emergency department for evaluation of chest fluttering.  Denies chest pain.  I reviewed her cardiology visit from 07/27/2023.  At that time, patient presented with increased heart palpitations and her metoprolol was decreased.  She is on Xarelto for A-fib as well as flecainide for rhythm control.       Physical Exam   Triage Vital Signs: ED Triage Vitals  Encounter Vitals Group     BP 08/14/23 2128 (!) 149/75     Systolic BP Percentile --      Diastolic BP Percentile --      Pulse Rate 08/14/23 2128 81     Resp 08/14/23 2128 20     Temp 08/14/23 2128 98.6 F (37 C)     Temp Source 08/14/23 2128 Oral     SpO2 08/14/23 2128 96 %     Weight 08/14/23 2136 175 lb (79.4 kg)     Height 08/14/23 2136 5\' 6"  (1.676 m)     Head Circumference --      Peak Flow --      Pain Score 08/14/23 2136 0     Pain Loc --      Pain Education --      Exclude from Growth Chart --     Most recent vital signs: Vitals:   08/15/23 0130 08/15/23 0200  BP: (!) 146/69 (!) 154/63  Pulse: 67 66  Resp: 12 13  Temp:    SpO2:       General: Awake, interactive  CV:  Regular rate, good peripheral perfusion.  Resp:  Unlabored respirations, lungs clear to auscultation Abd:  Nondistended, soft, nontender to palpation Neuro:  Symmetric facial movement, fluid speech   ED Results / Procedures / Treatments   Labs (all labs ordered are listed, but only abnormal results are displayed) Labs Reviewed  BASIC METABOLIC PANEL - Abnormal; Notable for the following components:      Result Value   Potassium 3.3 (*)    Glucose, Bld 211 (*)    BUN 26 (*)    Creatinine, Ser 1.04 (*)    Calcium 8.6 (*)     GFR, Estimated 54 (*)    All other components within normal limits  MAGNESIUM - Abnormal; Notable for the following components:   Magnesium 2.6 (*)    All other components within normal limits  TROPONIN I (HIGH SENSITIVITY) - Abnormal; Notable for the following components:   Troponin I (High Sensitivity) 36 (*)    All other components within normal limits  TROPONIN I (HIGH SENSITIVITY) - Abnormal; Notable for the following components:   Troponin I (High Sensitivity) 44 (*)    All other components within normal limits  CBC  TSH  T4, FREE     EKG EKG independently reviewed interpreted by myself (ER attending) demonstrates:  EKG demonstrates sinus rhythm at a rate of 81, PR 228, QRS 84, QTc 469, no acute ST changes  RADIOLOGY Imaging independently reviewed and interpreted by myself demonstrates:  CXR without focal consolidation  PROCEDURES:  Critical Care performed: No  Procedures   MEDICATIONS ORDERED IN ED: Medications  potassium chloride SA (KLOR-CON M) CR tablet 40 mEq (  has no administration in time range)     IMPRESSION / MDM / ASSESSMENT AND PLAN / ED COURSE  I reviewed the triage vital signs and the nursing notes.  Differential diagnosis includes, but is not limited to, arrhythmia, anemia, electrolyte abnormality, thyroid dysfunction  Patient's presentation is most consistent with acute presentation with potential threat to life or bodily function.  83 year old female presenting with chest fluttering without chest pain.  Stable vitals on presentation.  Labs with mild hypokalemia, orally repleted.  Mag slightly elevated.  Troponin slightly elevated at 36, overall stable on repeat, similar to prior.  Normal thyroid function.  Patient reassessed, denies any new symptoms.  I discussed potentially holding her metoprolol as she does feel that this is contributing to her symptoms, but I did discuss that she is at likelihood of developing A-fib with RVR if we do this.   She prefers to hold off on any medication changes here in follow-up with cardiology as an outpatient for further evaluation.  Will DC with short course of potassium to optimize her K to above 4.  Strict return precautions provided.  Patient discharged in stable condition.    FINAL CLINICAL IMPRESSION(S) / ED DIAGNOSES   Final diagnoses:  Palpitations     Rx / DC Orders   ED Discharge Orders          Ordered    potassium chloride SA (KLOR-CON M) 20 MEQ tablet  2 times daily        08/15/23 0226             Note:  This document was prepared using Dragon voice recognition software and may include unintentional dictation errors.   Trinna Post, MD 08/15/23 [phone]

## 2023-08-18 ENCOUNTER — Ambulatory Visit
Admission: RE | Admit: 2023-08-18 | Discharge: 2023-08-18 | Disposition: A | Discharge status: Home / Self Care | Source: Ambulatory Visit | Attending: Specialist | Admitting: Specialist

## 2023-08-18 DIAGNOSIS — R918 Other nonspecific abnormal finding of lung field: Secondary | ICD-10-CM | POA: Diagnosis present

## 2024-01-25 ENCOUNTER — Other Ambulatory Visit: Payer: Self-pay | Admitting: Orthopedic Surgery

## 2024-01-25 ENCOUNTER — Ambulatory Visit: Admitting: Podiatry

## 2024-01-25 DIAGNOSIS — M542 Cervicalgia: Secondary | ICD-10-CM

## 2024-01-27 ENCOUNTER — Ambulatory Visit (INDEPENDENT_AMBULATORY_CARE_PROVIDER_SITE_OTHER): Admitting: Podiatry

## 2024-01-27 DIAGNOSIS — Q828 Other specified congenital malformations of skin: Secondary | ICD-10-CM

## 2024-01-27 NOTE — Progress Notes (Signed)
 Subjective:  Patient ID: Mallory Arellano, female    DOB: January 08, 1941,  MRN: [id number]  Chief Complaint  Patient presents with   Callouses    Pt stated that she has this place on the bottom of her great toe that causes her some discomfort     83 y.o. female presents with the above complaint.  Patient presents with right hallux porokeratotic lesion painful to touch is progressing and worse worse with ambulation worse with pressure she would like to discuss treatment options for this.  She states causing her some discomfort denies any other acute issues pain scale is 5 out of 10 dull aching nature   Review of Systems: Negative except as noted in the HPI. Denies N/V/F/Ch.  Past Medical History:  Diagnosis Date   A-fib (HCC)    Arthritis    Asthma    Back pain    Diabetes mellitus without complication (HCC)    Diabetes mellitus, type II (HCC)    Dysrhythmia    Ganglion cyst of foot    GERD (gastroesophageal reflux disease)    Hypercholesteremia    Hypertension    Lower extremity edema    Lumbar radiculitis    Osteoarthritis    Palpitation    Perioral dermatitis    UTI (urinary tract infection)     Current Outpatient Medications:    Accu-Chek Softclix Lancets lancets, Use to check blood glucose twice daily, Disp: 100 each, Rfl: 2   acetaminophen -codeine  (TYLENOL  #3) 300-30 MG tablet, Take 1-2 tablets by mouth every 4 (four) hours as needed for moderate pain., Disp: 30 tablet, Rfl: 0   albuterol  (VENTOLIN  HFA) 108 (90 Base) MCG/ACT inhaler, Inhale 2 puffs into the lungs every 6 (six) hours as needed for wheezing or shortness of breath., Disp: 8 g, Rfl: 0   Blood Glucose Monitoring Suppl (ACCU-CHEK GUIDE ME) w/Device KIT, Use to check blood glucose twice daily, Disp: 1 kit, Rfl: 0   budesonide -formoterol  (SYMBICORT ) 160-4.5 MCG/ACT inhaler, Inhale 2 puffs into the lungs 2 (two) times daily., Disp: , Rfl:    chlorthalidone (HYGROTON) 25 MG tablet, Take 25 mg by mouth daily., Disp: ,  Rfl:    Cholecalciferol  (VITAMIN D3) 50 MCG (2000 UT) capsule, TAKE 1 CAPSULE (2,000 UNITS TOTAL) BY MOUTH DAILY WITH BREAKFAST., Disp: 30 capsule, Rfl: 0   diclofenac Sodium (VOLTAREN) 1 % GEL, Apply 2 g topically 2 (two) times daily., Disp: , Rfl:    diphenhydrAMINE  (BENADRYL ) 25 mg capsule, Take 1 capsule (25 mg total) by mouth every 6 (six) hours as needed., Disp: 30 capsule, Rfl: 0   ezetimibe  (ZETIA ) 10 MG tablet, Take 1 tablet (10 mg total) by mouth daily. PLEASE SCHEDULE OFFICE VISIT FOR FURTHER REFILLS. THANK YOU!, Disp: 90 tablet, Rfl: 0   famotidine  (PEPCID ) 20 MG tablet, Take 1 tablet (20 mg total) by mouth 2 (two) times daily., Disp: 60 tablet, Rfl: 11   fexofenadine (ALLEGRA) 180 MG tablet, Take 180 mg by mouth daily., Disp: , Rfl:    flecainide  (TAMBOCOR ) 50 MG tablet, Take 1 tablet (50 mg total) by mouth every 12 (twelve) hours., Disp: 60 tablet, Rfl: 0   fluticasone  (FLONASE ) 50 MCG/ACT nasal spray, Place 2 sprays into both nostrils daily., Disp: , Rfl:    furosemide  (LASIX ) 20 MG tablet, Take 1 tablet (20 mg total) by mouth daily as needed for edema., Disp: , Rfl:    glipiZIDE  (GLUCOTROL  XL) 5 MG 24 hr tablet, TAKE 1 TABLET BY MOUTH EVERY DAY WITH BREAKFAST,  Disp: 90 tablet, Rfl: 1   glucose blood (ACCU-CHEK GUIDE) test strip, Use to check blood glucose twice daily, Disp: 150 strip, Rfl: 2   HYDROcodone -acetaminophen  (NORCO) 5-325 MG tablet, Take 1 tablet by mouth every 6 (six) hours as needed for moderate pain., Disp: 15 tablet, Rfl: 0   hydrOXYzine  (ATARAX /VISTARIL ) 25 MG tablet, Take 25 mg by mouth 3 (three) times daily as needed for itching. , Disp: , Rfl:    levalbuterol  (XOPENEX ) 1.25 MG/3ML nebulizer solution, Take 1.25 mg by nebulization every 6 (six) hours as needed for wheezing., Disp: , Rfl:    metoprolol  succinate (TOPROL -XL) 25 MG 24 hr tablet, Take 0.5 tablets (12.5 mg total) by mouth daily. Take with or immediately following a meal., Disp: 46 tablet, Rfl: 3    molnupiravir  EUA (LAGEVRIO ) 200 mg CAPS capsule, Take 4 capsules (800 mg total) by mouth 2 (two) times daily for 5 days., Disp: 40 capsule, Rfl: 0   montelukast  (SINGULAIR ) 10 MG tablet, Take 10 mg by mouth daily., Disp: , Rfl:    ondansetron  (ZOFRAN ) 4 MG tablet, Take 1 tablet (4 mg total) by mouth every 8 (eight) hours as needed for nausea or vomiting., Disp: 15 tablet, Rfl: 0   pantoprazole  (PROTONIX ) 40 MG tablet, Take 40 mg by mouth 2 (two) times daily. , Disp: , Rfl: 11   potassium chloride  SA (KLOR-CON  M) 20 MEQ tablet, Take 1 tablet (20 mEq total) by mouth 2 (two) times daily for 2 days., Disp: 4 tablet, Rfl: 0   rivaroxaban  (XARELTO ) 20 MG TABS tablet, Take 20 mg by mouth daily with breakfast., Disp: , Rfl:    SUMAtriptan  (IMITREX ) 25 MG tablet, Take 1 tablet by mouth every 2 (two) hours as needed for migraine. , Disp: , Rfl:   Social History   Tobacco Use  Smoking Status Never  Smokeless Tobacco Never    Allergies  Allergen Reactions   Atorvastatin      Other reaction(s): Muscle Pain   Losartan  Cough   Sitagliptin Swelling    ABDOMINAL SWELLING   Farxiga  [Dapagliflozin ] Hives   Metformin  And Related Other (See Comments)    Stomach swelling   Warfarin Sodium Other (See Comments)    Patient didn't know what happened but states she couldn't take it   Penicillins Itching    Has patient had a PCN reaction causing immediate rash, facial/tongue/throat swelling, SOB or lightheadedness with hypotension: No Has patient had a PCN reaction causing severe rash involving mucus membranes or skin necrosis: No Has patient had a PCN reaction that required hospitalization: No Has patient had a PCN reaction occurring within the last 10 years: No If all of the above answers are NO, then may proceed with Cephalosporin use.    Objective:  There were no vitals filed for this visit. There is no height or weight on file to calculate BMI. Constitutional Well developed. Well nourished.   Vascular Dorsalis pedis pulses palpable bilaterally. Posterior tibial pulses palpable bilaterally. Capillary refill normal to all digits.  No cyanosis or clubbing noted. Pedal hair growth normal.  Neurologic Normal speech. Oriented to person, place, and time. Epicritic sensation to light touch grossly present bilaterally.  Dermatologic Nails well groomed and normal in appearance. No open wounds. No skin lesions.  Orthopedic: Right hallux porokeratotic lesion with central nuclear core noted pain on palpation.  No pinpoint bleeding noted upon debridement.   Radiographs: None Assessment:   1. Porokeratosis    Plan:  Patient was evaluated and treated and all  questions answered.  Right hallux porokeratosis - All questions and concerns were discussed with the patient in extensive detail - Given the amount of pain that she is having she would benefit from debridement of the lesion using chisel blade handle the lesion was debride down to healthy stripe tissue no complication or no pinpoint bleeding noted - Shoe gear modification discussed  No follow-ups on file.

## 2024-01-29 ENCOUNTER — Other Ambulatory Visit: Payer: Self-pay

## 2024-01-29 ENCOUNTER — Emergency Department
Admission: EM | Admit: 2024-01-29 | Discharge: 2024-01-29 | Disposition: A | Discharge status: Home / Self Care | Attending: Emergency Medicine | Admitting: Emergency Medicine

## 2024-01-29 ENCOUNTER — Emergency Department

## 2024-01-29 ENCOUNTER — Encounter: Payer: Self-pay | Admitting: Emergency Medicine

## 2024-01-29 DIAGNOSIS — R051 Acute cough: Secondary | ICD-10-CM | POA: Diagnosis present

## 2024-01-29 DIAGNOSIS — Z7901 Long term (current) use of anticoagulants: Secondary | ICD-10-CM | POA: Insufficient documentation

## 2024-01-29 DIAGNOSIS — J45909 Unspecified asthma, uncomplicated: Secondary | ICD-10-CM | POA: Insufficient documentation

## 2024-01-29 DIAGNOSIS — I4891 Unspecified atrial fibrillation: Secondary | ICD-10-CM | POA: Insufficient documentation

## 2024-01-29 DIAGNOSIS — E876 Hypokalemia: Secondary | ICD-10-CM | POA: Diagnosis not present

## 2024-01-29 DIAGNOSIS — E119 Type 2 diabetes mellitus without complications: Secondary | ICD-10-CM | POA: Diagnosis not present

## 2024-01-29 DIAGNOSIS — U071 COVID-19: Secondary | ICD-10-CM | POA: Diagnosis not present

## 2024-01-29 DIAGNOSIS — I10 Essential (primary) hypertension: Secondary | ICD-10-CM | POA: Insufficient documentation

## 2024-01-29 DIAGNOSIS — E871 Hypo-osmolality and hyponatremia: Secondary | ICD-10-CM | POA: Insufficient documentation

## 2024-01-29 LAB — RESP PANEL BY RT-PCR (RSV, FLU A&B, COVID)  RVPGX2
Influenza A by PCR: NEGATIVE
Influenza B by PCR: NEGATIVE
Resp Syncytial Virus by PCR: NEGATIVE
SARS Coronavirus 2 by RT PCR: POSITIVE — AB

## 2024-01-29 LAB — BASIC METABOLIC PANEL WITH GFR
Anion gap: 9 (ref 5–15)
BUN: 17 mg/dL (ref 8–23)
CO2: 28 mmol/L (ref 22–32)
Calcium: 8.7 mg/dL — ABNORMAL LOW (ref 8.9–10.3)
Chloride: 97 mmol/L — ABNORMAL LOW (ref 98–111)
Creatinine, Ser: 0.83 mg/dL (ref 0.44–1.00)
GFR, Estimated: >60 mL/min (ref >60)
Glucose, Bld: 167 mg/dL — ABNORMAL HIGH (ref 70–99)
Potassium: 3.4 mmol/L — ABNORMAL LOW (ref 3.5–5.1)
Sodium: 134 mmol/L — ABNORMAL LOW (ref 135–145)

## 2024-01-29 LAB — CBC WITH DIFFERENTIAL/PLATELET
Abs Immature Granulocytes: 0.04 K/uL (ref 0.00–0.07)
Basophils Absolute: 0 K/uL (ref 0.0–0.1)
Basophils Relative: 1 %
Eosinophils Absolute: 0 K/uL (ref 0.0–0.5)
Eosinophils Relative: 0 %
HCT: 42 % (ref 36.0–46.0)
Hemoglobin: 13.3 g/dL (ref 12.0–15.0)
Immature Granulocytes: 1 %
Lymphocytes Relative: 18 %
Lymphs Abs: 1.2 K/uL (ref 0.7–4.0)
MCH: 27.4 pg (ref 26.0–34.0)
MCHC: 31.7 g/dL (ref 30.0–36.0)
MCV: 86.6 fL (ref 80.0–100.0)
Monocytes Absolute: 1.3 K/uL — ABNORMAL HIGH (ref 0.1–1.0)
Monocytes Relative: 18 %
Neutro Abs: 4.3 K/uL (ref 1.7–7.7)
Neutrophils Relative %: 62 %
Platelets: 245 K/uL (ref 150–400)
RBC: 4.85 MIL/uL (ref 3.87–5.11)
RDW: 15.3 % (ref 11.5–15.5)
WBC: 6.9 K/uL (ref 4.0–10.5)
nRBC: 0 % (ref 0.0–0.2)

## 2024-01-29 MED ORDER — ALBUTEROL SULFATE HFA 108 (90 BASE) MCG/ACT IN AERS: 2.0000 | INHALATION_SPRAY | Freq: Four times a day (QID) | RESPIRATORY_TRACT | 0 refills | Status: AC | PRN

## 2024-01-29 MED ORDER — MOLNUPIRAVIR EUA 200MG CAPSULE: 4.0000 | ORAL_CAPSULE | Freq: Two times a day (BID) | ORAL | 0 refills | Status: AC

## 2024-01-29 MED ORDER — ACETAMINOPHEN 500 MG PO TABS
1000.0000 mg | ORAL_TABLET | Freq: Once | ORAL | Status: AC
  Administered 2024-01-29: 1000 mg via ORAL
  Filled 2024-01-29: qty 2

## 2024-01-29 MED ORDER — POTASSIUM CHLORIDE CRYS ER 20 MEQ PO TBCR
20.0000 meq | EXTENDED_RELEASE_TABLET | Freq: Once | ORAL | Status: AC
  Administered 2024-01-29: 20 meq via ORAL
  Filled 2024-01-29: qty 1

## 2024-01-29 MED ORDER — IPRATROPIUM-ALBUTEROL 0.5-2.5 (3) MG/3ML IN SOLN
3.0000 mL | Freq: Once | RESPIRATORY_TRACT | Status: AC
  Administered 2024-01-29: 3 mL via RESPIRATORY_TRACT
  Filled 2024-01-29: qty 3

## 2024-01-29 NOTE — ED Triage Notes (Signed)
Pt c/o cough and headache x 3 days.

## 2024-01-29 NOTE — ED Provider Notes (Signed)
 [facility] Provider Note    Event Date/Time   First MD Initiated Contact with Patient 01/29/24 2200     (approximate)   History   Chief Complaint Cough   HPI  Mallory Arellano is a 83 y.o. female with a past medical history of hypertension, diabetes, asthma, and atrial fibrillation on Xarelto  who presents to the ED complaining of cough.  Patient reports that she has had a cough productive of whitish sputum for the past 3 days.  She denies any fevers at home and has not had any chest pain or shortness of breath.  She had 1 episode of vomiting yesterday, but has not had any nausea today and denies any diarrhea or dysuria.  She reports occasional headache but denies any neck stiffness.  She is not aware of any sick contacts.     Physical Exam   Triage Vital Signs: ED Triage Vitals [01/29/24 2156]  Encounter Vitals Group     BP (!) 155/65     Girls Systolic BP Percentile      Girls Diastolic BP Percentile      Boys Systolic BP Percentile      Boys Diastolic BP Percentile      Pulse Rate 87     Resp 18     Temp (!) 101.3 F (38.5 C)     Temp src      SpO2 95 %     Weight 174 lb (78.9 kg)     Height 5' 6 (1.676 m)     Head Circumference      Peak Flow      Pain Score 9     Pain Loc      Pain Education      Exclude from Growth Chart     Most recent vital signs: Vitals:   01/29/24 2156  BP: (!) 155/65  Pulse: 87  Resp: 18  Temp: (!) 101.3 F (38.5 C)  SpO2: 95%    Constitutional: Alert and oriented. Eyes: Conjunctivae are normal. Head: Atraumatic. Nose: No congestion/rhinnorhea. Mouth/Throat: Mucous membranes are moist.  Neck: Supple with no meningismus. Cardiovascular: Normal rate, regular rhythm. Grossly normal heart sounds.  2+ radial pulses bilaterally. Respiratory: Normal respiratory effort.  No retractions. Lungs CTAB. Gastrointestinal: Soft and nontender. No distention. Musculoskeletal: No lower extremity tenderness nor edema.   Neurologic:  Normal speech and language. No gross focal neurologic deficits are appreciated.    ED Results / Procedures / Treatments   Labs (all labs ordered are listed, but only abnormal results are displayed) Labs Reviewed  RESP PANEL BY RT-PCR (RSV, FLU A&B, COVID)  [id number] - Abnormal; Notable for the following components:      Result Value   SARS Coronavirus 2 by RT PCR POSITIVE (*)    All other components within normal limits  CBC WITH DIFFERENTIAL/PLATELET - Abnormal; Notable for the following components:   Monocytes Absolute 1.3 (*)    All other components within normal limits  BASIC METABOLIC PANEL WITH GFR - Abnormal; Notable for the following components:   Sodium 134 (*)    Potassium 3.4 (*)    Chloride 97 (*)    Glucose, Bld 167 (*)    Calcium  8.7 (*)    All other components within normal limits    RADIOLOGY Chest x-ray reviewed and interpreted by me with no infiltrate, edema, or effusion.  PROCEDURES:  Critical Care performed: No  Procedures   MEDICATIONS ORDERED IN ED: Medications  acetaminophen  (TYLENOL )  tablet 1,000 mg (1,000 mg Oral Given 01/29/24 2209)  ipratropium-albuterol  (DUONEB) 0.5-2.5 (3) MG/3ML nebulizer solution 3 mL (3 mLs Nebulization Given 01/29/24 2237)  potassium chloride  SA (KLOR-CON  M) CR tablet 20 mEq (20 mEq Oral Given 01/29/24 2257)     IMPRESSION / MDM / ASSESSMENT AND PLAN / ED COURSE  I reviewed the triage vital signs and the nursing notes.                              83 y.o. female with past medical history of hypertension, diabetes, asthma, and atrial fibrillation on Xarelto  who presents to the ED complaining of 3 days of productive cough.  Patient's presentation is most consistent with acute presentation with potential threat to life or bodily function.  Differential diagnosis includes, but is not limited to, sepsis, pneumonia, viral syndrome, COVID-19, influenza, asthma exacerbation.  Patient nontoxic-appearing and in no  acute distress, vital signs remarkable for fever of 101.3 but otherwise reassuring.  She is not in any respiratory distress and maintaining oxygen saturations at 95% on room air, lungs are clear to auscultation bilaterally.  Patient does not meet sepsis criteria given normal heart rate and respiratory rate, will screen labs and chest x-ray as well as COVID and flu testing.  Chest x-ray is unremarkable, labs are reassuring with no significant anemia, leukocytosis, electrolyte abnormality, or AKI.  Patient tested positive for COVID-19, which is likely the source of her symptoms.  She is feeling better following DuoNeb and appropriate for outpatient management.  We will prescribe molnupiravir  as she is not a candidate for Paxlovid given interaction with Xarelto .  She will also be prescribed albuterol  for symptomatic management, was counseled to follow-up with her PCP and to return to the ED for new or worsening symptoms.  Patient and family agree with plan.      FINAL CLINICAL IMPRESSION(S) / ED DIAGNOSES   Final diagnoses:  Acute cough  COVID-19     Rx / DC Orders   ED Discharge Orders          Ordered    albuterol  (VENTOLIN  HFA) 108 (90 Base) MCG/ACT inhaler  Every 6 hours PRN       Note to Pharmacy: Please supply with spacer   01/29/24 2303    molnupiravir  EUA (LAGEVRIO ) 200 mg CAPS capsule  2 times daily        01/29/24 2303             Note:  This document was prepared using Dragon voice recognition software and may include unintentional dictation errors.   Willo Dunnings, MD 01/29/24 [phone]

## 2024-02-03 ENCOUNTER — Ambulatory Visit: Admitting: "Endocrinology

## 2024-02-12 ENCOUNTER — Other Ambulatory Visit: Payer: Self-pay

## 2024-02-12 ENCOUNTER — Emergency Department

## 2024-02-12 DIAGNOSIS — I700 Atherosclerosis of aorta: Secondary | ICD-10-CM | POA: Diagnosis not present

## 2024-02-12 DIAGNOSIS — Z7951 Long term (current) use of inhaled steroids: Secondary | ICD-10-CM | POA: Insufficient documentation

## 2024-02-12 DIAGNOSIS — Z79899 Other long term (current) drug therapy: Secondary | ICD-10-CM | POA: Diagnosis not present

## 2024-02-12 DIAGNOSIS — R079 Chest pain, unspecified: Secondary | ICD-10-CM | POA: Diagnosis present

## 2024-02-12 DIAGNOSIS — U099 Post covid-19 condition, unspecified: Secondary | ICD-10-CM | POA: Insufficient documentation

## 2024-02-12 DIAGNOSIS — I119 Hypertensive heart disease without heart failure: Secondary | ICD-10-CM | POA: Diagnosis not present

## 2024-02-12 DIAGNOSIS — I4891 Unspecified atrial fibrillation: Secondary | ICD-10-CM | POA: Diagnosis not present

## 2024-02-12 DIAGNOSIS — E119 Type 2 diabetes mellitus without complications: Secondary | ICD-10-CM | POA: Diagnosis not present

## 2024-02-12 DIAGNOSIS — I2510 Atherosclerotic heart disease of native coronary artery without angina pectoris: Secondary | ICD-10-CM | POA: Diagnosis not present

## 2024-02-12 DIAGNOSIS — R0781 Pleurodynia: Secondary | ICD-10-CM | POA: Diagnosis present

## 2024-02-12 DIAGNOSIS — E785 Hyperlipidemia, unspecified: Secondary | ICD-10-CM | POA: Insufficient documentation

## 2024-02-12 DIAGNOSIS — Z7984 Long term (current) use of oral hypoglycemic drugs: Secondary | ICD-10-CM | POA: Insufficient documentation

## 2024-02-12 DIAGNOSIS — I10 Essential (primary) hypertension: Secondary | ICD-10-CM | POA: Insufficient documentation

## 2024-02-12 DIAGNOSIS — J45909 Unspecified asthma, uncomplicated: Secondary | ICD-10-CM | POA: Insufficient documentation

## 2024-02-12 DIAGNOSIS — R0602 Shortness of breath: Secondary | ICD-10-CM | POA: Diagnosis present

## 2024-02-12 LAB — BASIC METABOLIC PANEL WITH GFR
Anion gap: 10 (ref 5–15)
BUN: 24 mg/dL — ABNORMAL HIGH (ref 8–23)
CO2: 26 mmol/L (ref 22–32)
Calcium: 9 mg/dL (ref 8.9–10.3)
Chloride: 103 mmol/L (ref 98–111)
Creatinine, Ser: 1.26 mg/dL — ABNORMAL HIGH (ref 0.44–1.00)
GFR, Estimated: 42 mL/min — ABNORMAL LOW (ref >60)
Glucose, Bld: 144 mg/dL — ABNORMAL HIGH (ref 70–99)
Potassium: 4 mmol/L (ref 3.5–5.1)
Sodium: 139 mmol/L (ref 135–145)

## 2024-02-12 LAB — CBC
HCT: 41.1 % (ref 36.0–46.0)
Hemoglobin: 13.2 g/dL (ref 12.0–15.0)
MCH: 28.1 pg (ref 26.0–34.0)
MCHC: 32.1 g/dL (ref 30.0–36.0)
MCV: 87.4 fL (ref 80.0–100.0)
Platelets: 290 K/uL (ref 150–400)
RBC: 4.7 MIL/uL (ref 3.87–5.11)
RDW: 14.9 % (ref 11.5–15.5)
WBC: 9.4 K/uL (ref 4.0–10.5)
nRBC: 0 % (ref 0.0–0.2)

## 2024-02-12 LAB — TROPONIN I (HIGH SENSITIVITY): Troponin I (High Sensitivity): 32 ng/L — ABNORMAL HIGH (ref <18)

## 2024-02-12 NOTE — ED Triage Notes (Signed)
 Pt reports that when she takes a deep breath she has pain in the right mid (thoracic) back area. Some SOB, has been using her albuterol  inhaler that does help. She says that she had a cough for about 2 weeks, Denies fevers. Reports she called her pulmonologist (Dr. Brigida) who wanted her to come in for evaluation of blood clots.

## 2024-02-12 NOTE — ED Notes (Signed)
 Pt was positive for covid on 01/29/24

## 2024-02-13 ENCOUNTER — Emergency Department
Admission: EM | Admit: 2024-02-13 | Discharge: 2024-02-13 | Disposition: A | Discharge status: Home / Self Care | Attending: Emergency Medicine | Admitting: Emergency Medicine

## 2024-02-13 ENCOUNTER — Other Ambulatory Visit: Payer: Self-pay

## 2024-02-13 ENCOUNTER — Emergency Department

## 2024-02-13 DIAGNOSIS — R0781 Pleurodynia: Secondary | ICD-10-CM

## 2024-02-13 LAB — RESP PANEL BY RT-PCR (RSV, FLU A&B, COVID)  RVPGX2
Influenza A by PCR: NEGATIVE
Influenza B by PCR: NEGATIVE
Resp Syncytial Virus by PCR: NEGATIVE
SARS Coronavirus 2 by RT PCR: POSITIVE — AB

## 2024-02-13 LAB — TROPONIN I (HIGH SENSITIVITY): Troponin I (High Sensitivity): 33 ng/L — ABNORMAL HIGH (ref <18)

## 2024-02-13 MED ORDER — IOHEXOL 350 MG/ML SOLN
75.0000 mL | Freq: Once | INTRAVENOUS | Status: AC | PRN
  Administered 2024-02-13: 75 mL via INTRAVENOUS

## 2024-02-13 MED ORDER — DOCUSATE SODIUM 100 MG PO CAPS: 100.0000 mg | ORAL_CAPSULE | Freq: Two times a day (BID) | ORAL | 0 refills | Status: AC

## 2024-02-13 MED ORDER — SODIUM CHLORIDE 0.9 % IV BOLUS (SEPSIS)
500.0000 mL | Freq: Once | INTRAVENOUS | Status: AC
  Administered 2024-02-13: 500 mL via INTRAVENOUS

## 2024-02-13 MED ORDER — TRAMADOL HCL 50 MG PO TABS: 50.0000 mg | ORAL_TABLET | Freq: Three times a day (TID) | ORAL | 0 refills | Status: AC | PRN

## 2024-02-13 MED ORDER — ACETAMINOPHEN 500 MG PO TABS
1000.0000 mg | ORAL_TABLET | Freq: Once | ORAL | Status: AC
  Administered 2024-02-13: 1000 mg via ORAL
  Filled 2024-02-13: qty 2

## 2024-02-13 MED ORDER — ONDANSETRON 4 MG PO TBDP: 4.0000 mg | ORAL_TABLET | Freq: Three times a day (TID) | ORAL | 0 refills | Status: AC | PRN

## 2024-02-13 NOTE — ED Provider Notes (Signed)
 [facility] Provider Note    Event Date/Time   First MD Initiated Contact with Patient 02/13/24 0019     (approximate)   History   Back Pain and Shortness of Breath   HPI  Mallory Arellano is a 83 y.o. female with history of atrial fibrillation, hypertension, diabetes, hyperlipidemia, asthma who presents to the emergency department for evaluation for pleuritic pain.  States she is having pain in her upper back with deep inspiration.  No falls, injury, numbness, tingling, weakness, chest pain, shortness of breath, fevers, cough.  Was sent here by her pulmonologist Dr. Theotis to rule out PE.   History provided by patient, son.    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 Authorizing Provider  Accu-Chek Softclix Lancets lancets Use to check blood glucose twice daily 03/05/22   Nida, Gebreselassie W, MD  acetaminophen -codeine  (TYLENOL  #3) 300-30 MG tablet Take 1-2 tablets by mouth every 4 (four) hours as needed for moderate pain. 09/18/21   Tobie Franky SQUIBB, DPM  albuterol  (VENTOLIN  HFA) 108 (90 Base) MCG/ACT inhaler Inhale 2 puffs into the lungs every 6  (six) hours as needed for wheezing or shortness of breath. 01/29/24   Willo Dunnings, MD  Blood Glucose Monitoring Suppl (ACCU-CHEK GUIDE ME) w/Device KIT Use to check blood glucose twice daily 03/05/22   Nida, Gebreselassie W, MD  budesonide -formoterol  (SYMBICORT ) 160-4.5 MCG/ACT inhaler Inhale 2 puffs into the lungs 2 (two) times daily.    [provider]  chlorthalidone (HYGROTON) 25 MG tablet Take 25 mg by mouth daily. 08/02/21   [provider]  Cholecalciferol  (VITAMIN D3) 50 MCG (2000 UT) capsule TAKE 1 CAPSULE (2,000 UNITS TOTAL) BY MOUTH DAILY WITH BREAKFAST. 01/12/23   Nida, Gebreselassie W, MD  diclofenac Sodium (VOLTAREN) 1 % GEL Apply 2 g topically 2 (two) times daily.    [provider]  diphenhydrAMINE  (BENADRYL ) 25 mg capsule Take 1 capsule (25 mg total) by mouth every 6 (six) hours as needed. 01/10/22   Antonetta Penne Ruth, PA  ezetimibe  (ZETIA ) 10 MG tablet Take 1 tablet (10 mg total) by mouth daily. PLEASE SCHEDULE OFFICE VISIT FOR FURTHER REFILLS. THANK YOU! 08/08/21   Gollan, Timothy J, MD  famotidine  (PEPCID ) 20 MG tablet Take 1 tablet (20 mg total) by mouth 2 (two) times daily. 01/10/22 01/10/23  Antonetta Penne Ruth, PA  fexofenadine (ALLEGRA) 180 MG tablet Take 180 mg by mouth daily. 08/14/21   [provider]  flecainide  (TAMBOCOR ) 50 MG tablet Take 1 tablet (50  mg total) by mouth every 12 (twelve) hours. 08/15/19   Jens Durand, MD  fluticasone  (FLONASE ) 50 MCG/ACT nasal spray Place 2 sprays into both nostrils daily.    [provider]  furosemide  (LASIX ) 20 MG tablet Take 1 tablet (20 mg total) by mouth daily as needed for edema. 08/13/19   Jens Durand, MD  glipiZIDE  (GLUCOTROL  XL) 5 MG 24 hr tablet TAKE 1 TABLET BY MOUTH EVERY DAY WITH BREAKFAST 03/09/22   Nida, Gebreselassie W, MD  glucose blood (ACCU-CHEK GUIDE) test strip Use to check blood glucose twice daily 03/05/22   Nida, Gebreselassie W, MD  HYDROcodone -acetaminophen   (NORCO) 5-325 MG tablet Take 1 tablet by mouth every 6 (six) hours as needed for moderate pain. 07/10/22   Sung, Jade J, MD  hydrOXYzine  (ATARAX /VISTARIL ) 25 MG tablet Take 25 mg by mouth 3 (three) times daily as needed for itching.     [provider]  levalbuterol  (XOPENEX ) 1.25 MG/3ML nebulizer solution Take 1.25 mg by nebulization every 6 (six) hours as needed for wheezing.    [provider]  metoprolol  succinate (TOPROL -XL) 25 MG 24 hr tablet Take 0.5 tablets (12.5 mg total) by mouth daily. Take with or immediately following a meal. 11/13/20   Gollan, Timothy J, MD  montelukast  (SINGULAIR ) 10 MG tablet Take 10 mg by mouth daily. 09/10/21   [provider]  ondansetron  (ZOFRAN ) 4 MG tablet Take 1 tablet (4 mg total) by mouth every 8 (eight) hours as needed for nausea or vomiting. 08/13/19   Jens Durand, MD  pantoprazole  (PROTONIX ) 40 MG tablet Take 40 mg by mouth 2 (two) times daily.  09/05/14   [provider]  potassium chloride  SA (KLOR-CON  M) 20 MEQ tablet Take 1 tablet (20 mEq total) by mouth 2 (two) times daily for 2 days. 08/15/23 08/17/23  Levander Slate, MD  rivaroxaban  (XARELTO ) 20 MG TABS tablet Take 20 mg by mouth daily with breakfast.    [provider]  SUMAtriptan  (IMITREX ) 25 MG tablet Take 1 tablet by mouth every 2 (two) hours as needed for migraine.     [provider]    Physical Exam   Triage Vital Signs: ED Triage Vitals  Encounter Vitals Group     BP 02/12/24 2228 (!) 178/74     Girls Systolic BP Percentile --      Girls Diastolic BP Percentile --      Boys Systolic BP Percentile --      Boys Diastolic BP Percentile --      Pulse Rate 02/12/24 2228 72     Resp 02/12/24 2228 20     Temp 02/12/24 2228 98.8 F (37.1 C)     Temp Source 02/12/24 2228 Oral     SpO2 02/12/24 2228 97 %     Weight 02/12/24 2228 174 lb (78.9 kg)     Height 02/12/24 2228 5' 6 (1.676 m)     Head Circumference --      Peak Flow --      Pain  Score 02/12/24 2226 8     Pain Loc --      Pain Education --      Exclude from Growth Chart --     Most recent vital signs: Vitals:   02/13/24 0100 02/13/24 0245  BP: (!) 166/66   Pulse: 65 62  Resp: 19 17  Temp:    SpO2: 98% 98%    CONSTITUTIONAL: Alert, responds appropriately to questions. Well-appearing; well-nourished, pleasant, appears younger than  stated age HEAD: Normocephalic, atraumatic EYES: Conjunctivae clear, pupils appear equal, sclera nonicteric ENT: normal nose; moist mucous membranes NECK: Supple, normal ROM CARD: RRR; S1 and S2 appreciated RESP: Normal chest excursion without splinting or tachypnea; breath sounds clear and equal bilaterally; no wheezes, no rhonchi, no rales, no hypoxia or respiratory distress, speaking full sentences ABD/GI: Non-distended; soft, non-tender, no rebound, no guarding, no peritoneal signs BACK: The back appears normal, no midline spinal tenderness or step-off or deformity, back pain is not reproducible with palpation EXT: Normal ROM in all joints; no deformity noted, no edema, no calf tenderness or calf swelling SKIN: Normal color for age and race; warm; no rash on exposed skin NEURO: Moves all extremities equally, normal speech PSYCH: The patient's mood and manner are appropriate.   ED Results / Procedures / Treatments   LABS: (all labs ordered are listed, but only abnormal results are displayed) Labs Reviewed  RESP PANEL BY RT-PCR (RSV, FLU A&B, COVID)  RVPGX2 - Abnormal; Notable for the following components:      Result Value   SARS Coronavirus 2 by RT PCR POSITIVE (*)    All other components within normal limits  BASIC METABOLIC PANEL WITH GFR - Abnormal; Notable for the following components:   Glucose, Bld 144 (*)    BUN 24 (*)    Creatinine, Ser 1.26 (*)    GFR, Estimated 42 (*)    All other components within normal limits  TROPONIN I (HIGH SENSITIVITY) - Abnormal; Notable for the following components:   Troponin I  (High Sensitivity) 32 (*)    All other components within normal limits  TROPONIN I (HIGH SENSITIVITY) - Abnormal; Notable for the following components:   Troponin I (High Sensitivity) 33 (*)    All other components within normal limits  CBC     EKG:  EKG Interpretation Date/Time:  Saturday February 12 2024 22:25:11 EDT Ventricular Rate:  70 PR Interval:  218 QRS Duration:  88 QT Interval:  424 QTC Calculation: 457 R Axis:   -42  Text Interpretation: Sinus rhythm with 1st degree A-V block Left axis deviation Moderate voltage criteria for LVH, may be normal variant ( R in aVL , Cornell product ) Abnormal ECG When compared with ECG of 14-Aug-2023 21:25, Non-specific change in ST segment in Inferior leads Non-specific change in ST segment in Anterior leads Confirmed by Jammy Stlouis, Josette [phone]) on 02/13/2024 12:26:47 AM           RADIOLOGY: My personal review and interpretation of imaging: CTA shows no PE.  No pneumonia.  I have personally reviewed all radiology reports.   CT Angio Chest PE W and/or Wo Contrast Result Date: 02/13/2024 CLINICAL DATA:  Pulmonary embolism (PE) suspected, high prob. Shortness of breath. Right mid back pain with inspiration. EXAM: CT ANGIOGRAPHY CHEST WITH CONTRAST TECHNIQUE: Multidetector CT imaging of the chest was performed using the standard protocol during bolus administration of intravenous contrast. Multiplanar CT image reconstructions and MIPs were obtained to evaluate the vascular anatomy. RADIATION DOSE REDUCTION: This exam was performed according to the departmental dose-optimization program which includes automated exposure control, adjustment of the mA and/or kV according to patient size and/or use of iterative reconstruction technique. CONTRAST:  75mL OMNIPAQUE  IOHEXOL  350 MG/ML SOLN COMPARISON:  08/18/2023 FINDINGS: Cardiovascular: No filling defects in the pulmonary arteries to suggest pulmonary emboli. Cardiomegaly. Scattered coronary artery and  aortic atherosclerosis. Mediastinum/Nodes: No mediastinal, hilar, or axillary adenopathy. Trachea and esophagus are unremarkable. Thyroid  unremarkable. Lungs/Pleura: Lungs are clear.  No focal airspace opacities or suspicious nodules. No effusions. Upper Abdomen: No acute findings Musculoskeletal: Chest wall soft tissues are unremarkable. No acute bony abnormality. Review of the MIP images confirms the above findings. IMPRESSION: Cardiomegaly, coronary artery disease. No evidence of pulmonary embolus. Aortic Atherosclerosis (ICD10-I70.0). Electronically Signed   By: Franky Crease M.D.   On: 02/13/2024 01:21   DG Chest 2 View Result Date: 02/12/2024 CLINICAL DATA:  Chest pain with shortness of breath. EXAM: CHEST - 2 VIEW COMPARISON:  Chest x-ray 01/29/2024 FINDINGS: The heart is enlarged. The lungs are clear. There is no pleural effusion or pneumothorax. Rounded nodular density projecting over the left lower lung is favored as a nipple shadow, but indeterminate. There is no pneumothorax or acute fracture. IMPRESSION: 1. Cardiomegaly. No acute cardiopulmonary process. 2. Rounded nodular density projecting over the left lower lung is favored as a nipple shadow, but indeterminate. Recommend repeat chest x-ray with nipple markers. Electronically Signed   By: Greig Pique M.D.   On: 02/12/2024 23:44     PROCEDURES:  Critical Care performed: No   CRITICAL CARE Performed by: Josette Allyah Heather   Total critical care time: 0 minutes  Critical care time was exclusive of separately billable procedures and treating other patients.  Critical care was necessary to treat or prevent imminent or life-threatening deterioration.  Critical care was time spent personally by me on the following activities: development of treatment plan with patient and/or surrogate as well as nursing, discussions with consultants, evaluation of patient's response to treatment, examination of patient, obtaining history from patient or  surrogate, ordering and performing treatments and interventions, ordering and review of laboratory studies, ordering and review of radiographic studies, pulse oximetry and re-evaluation of patient's condition.   SABRA1-3 Lead EKG Interpretation  Performed by: Yailin Biederman, Josette SAILOR, DO Authorized by: Suzannah Bettes, Josette SAILOR, DO     Interpretation: normal     ECG rate:  62   ECG rate assessment: normal     Rhythm: sinus rhythm     Ectopy: none     Conduction: normal       IMPRESSION / MDM / ASSESSMENT AND PLAN / ED COURSE  I reviewed the triage vital signs and the nursing notes.    Patient here for pleuritic back pain.  Sent here to rule out PE.  The patient is on the cardiac monitor to evaluate for evidence of arrhythmia and/or significant heart rate changes.   DIFFERENTIAL DIAGNOSIS (includes but not limited to):   Musculoskeletal back pain, pleurisy from recent COVID-19 diagnosis 2 weeks ago, pneumonia, PE, less likely ACS or dissection, doubt CHF or pneumothorax   Patient's presentation is most consistent with acute presentation with potential threat to life or bodily function.   PLAN: First troponin minimally elevated but at her baseline.  Second pending.  Chest x-ray reviewed and interpreted by myself and radiologist and is unremarkable.  Will proceed with CTA of the chest to rule out PE.   MEDICATIONS GIVEN IN ED: Medications  sodium chloride  0.9 % bolus 500 mL (0 mLs Intravenous Stopped 02/13/24 0113)  acetaminophen  (TYLENOL ) tablet 1,000 mg (1,000 mg Oral Given 02/13/24 0049)  iohexol  (OMNIPAQUE ) 350 MG/ML injection 75 mL (75 mLs Intravenous Contrast Given 02/13/24 0106)     ED COURSE: Second troponin flat.  Still COVID-positive but discussed with patient and son that this does not mean that she has active infection or that she is contagious as COVID test can be positive for weeks after the initial infection.  I suspect that her symptoms are due to pleurisy which could be due to the  previous infection.  She states her pain is now completely gone after Tylenol .  Recommended Tylenol  at home but did offer to send her home with something stronger which she agrees to just in case she needs it.  Will discharge with a short course of tramadol .  CT scan reviewed and interpreted by myself and the radiologist and shows no PE, infiltrate, edema, pneumothorax, rib fracture or other acute abnormality.  She is comfortable with plan for discharge home.  At this time, I do not feel there is any life-threatening condition present. I reviewed all nursing notes, vitals, pertinent previous records.  All lab and urine results, EKGs, imaging ordered have been independently reviewed and interpreted by myself.  I reviewed all available radiology reports from any imaging ordered this visit.  Based on my assessment, I feel the patient is safe to be discharged home without further emergent workup and can continue workup as an outpatient as needed. Discussed all findings, treatment plan as well as usual and customary return precautions.  They verbalize understanding and are comfortable with this plan.  Outpatient follow-up has been provided as needed.  All questions have been answered.     CONSULTS:  none   OUTSIDE RECORDS REVIEWED: Reviewed recent cardiology note.       FINAL CLINICAL IMPRESSION(S) / ED DIAGNOSES   Final diagnoses:  Pleuritic pain     Rx / DC Orders   ED Discharge Orders          Ordered    traMADol  (ULTRAM ) 50 MG tablet  Every 8 hours PRN        02/13/24 0302    ondansetron  (ZOFRAN -ODT) 4 MG disintegrating tablet  Every 8 hours PRN        02/13/24 0302    docusate sodium  (COLACE) 100 MG capsule  2 times daily        02/13/24 0302             Note:  This document was prepared using Dragon voice recognition software and may include unintentional dictation errors.   Ariyon Mittleman, Josette SAILOR, DO 02/13/24 1149

## 2024-02-13 NOTE — Discharge Instructions (Signed)
 CT scan showed no blood clot or pneumonia.  Labs showed no sign of heart attack today.  You may take Tylenol  1000 mg every 6 hours as needed for pain.

## 2024-03-16 ENCOUNTER — Encounter: Payer: Self-pay | Admitting: "Endocrinology

## 2024-03-16 ENCOUNTER — Ambulatory Visit (INDEPENDENT_AMBULATORY_CARE_PROVIDER_SITE_OTHER): Admitting: "Endocrinology

## 2024-03-16 VITALS — BP 130/70 | HR 84 | Ht 66.0 in | Wt 184.0 lb

## 2024-03-16 DIAGNOSIS — E1165 Type 2 diabetes mellitus with hyperglycemia: Secondary | ICD-10-CM

## 2024-03-16 DIAGNOSIS — Z7984 Long term (current) use of oral hypoglycemic drugs: Secondary | ICD-10-CM

## 2024-03-16 DIAGNOSIS — E782 Mixed hyperlipidemia: Secondary | ICD-10-CM

## 2024-03-16 LAB — POCT GLYCOSYLATED HEMOGLOBIN (HGB A1C): Hemoglobin A1C: 7.7 % — AB (ref 4.0–5.6)

## 2024-03-16 MED ORDER — RYBELSUS 3 MG PO TABS
3.0000 mg | ORAL_TABLET | Freq: Every day | ORAL | 1 refills | Status: AC
Start: 2024-03-16 — End: ?

## 2024-03-16 MED ORDER — RYBELSUS 3 MG PO TABS: 3.0000 mg | ORAL_TABLET | Freq: Every day | ORAL | 1 refills | Status: DC

## 2024-03-16 NOTE — Progress Notes (Signed)
 Outpatient Endocrinology Note Mallory Birmingham, MD  03/16/24   Mallory Arellano [birth year] [id number]  Referring Provider: Florencio Cara BIRCH, MD Primary Care Provider: Sebastian Jerilyn HERO, FNP Reason for consultation: Subjective   Assessment & Plan  Diagnoses and all orders for this visit:  Uncontrolled type 2 diabetes mellitus with hyperglycemia (HCC) -     POCT glycosylated hemoglobin (Hb A1C) -     Microalbumin / creatinine urine ratio -     Ambulatory referral to diabetic education  Long term (current) use of oral hypoglycemic drugs  Mixed hypercholesterolemia and hypertriglyceridemia  Other orders -     Discontinue: Semaglutide (RYBELSUS) 3 MG TABS; Take 1 tablet (3 mg total) by mouth daily. -     Semaglutide (RYBELSUS) 3 MG TABS; Take 1 tablet (3 mg total) by mouth daily.   Diabetes Type II complicated by neuropathy, No results found for: GFR Hba1c goal less than 7, current Hba1c is  Lab Results  Component Value Date   HGBA1C 7.7 (A) 03/16/2024   Will recommend the following: Glipizide  XL 5 mg every day Add rybelsus 3 mg daily with 4 ounces of water 30 minutes before breakfast Given blood sugar log sheet to check at least once a day before meals and bring it next visit  Cannot take Jardiance due to vaginal yeast infection Metformin  use is limited due to GFR, additionally does not want to take metformin  anyway  No history of MEN syndrome/medullary thyroid  cancer/pancreatitis or pancreatic cancer in self or family No known contraindications/side effects to any of above medications Glucagon discussed and prescribed with refills on 03/16/24 03/16/24  -Last LD and Tg are as follows: Lab Results  Component Value Date   LDLCALC 129 (H) 09/17/2021    Lab Results  Component Value Date   TRIG 151 (H) 09/17/2021   -not on statin, on ezetimibe  10 mg every day, thinks statin is not good for her, tried it in the past -Follow low fat diet and exercise   -Blood  pressure goal <140/90 - Microalbumin/creatinine goal is < 30 -Last MA/Cr is as follows: Lab Results  Component Value Date   MICROALBUR 30 09/29/2021   -not on ACE/ARB  -diet changes including salt restriction -limit eating outside -counseled BP targets per standards of diabetes care -uncontrolled blood pressure can lead to retinopathy, nephropathy and cardiovascular and atherosclerotic heart disease  Reviewed and counseled on: -A1C target -Blood sugar targets -Complications of uncontrolled diabetes  -Checking blood sugar before meals and bedtime and bring log next visit -All medications with mechanism of action and side effects -Hypoglycemia management: rule of 15's, Glucagon Emergency Kit and medical alert ID -low-carb low-fat plate-method diet -At least 20 minutes of physical activity per day -Annual dilated retinal eye exam and foot exam -compliance and follow up needs -follow up as scheduled or earlier if problem gets worse  Call if blood sugar is less than 70 or consistently above 250    Take a 15 gm snack of carbohydrate at bedtime before you go to sleep if your blood sugar is less than 100.    If you are going to fast after midnight for a test or procedure, ask your physician for instructions on how to reduce/decrease your insulin  dose.    Call if blood sugar is less than 70 or consistently above 250  -Treating a low sugar by rule of 15  (15 gms of sugar every 15 min until sugar is more than 70) If you feel your  sugar is low, test your sugar to be sure If your sugar is low (less than 70), then take 15 grams of a fast acting Carbohydrate (3-4 glucose tablets or glucose gel or 4 ounces of juice or regular soda) Recheck your sugar 15 min after treating low to make sure it is more than 70 If sugar is still less than 70, treat again with 15 grams of carbohydrate          Don't drive the hour of hypoglycemia  If unconscious/unable to eat or drink by mouth, use glucagon  injection or nasal spray baqsimi and call 911. Can repeat again in 15 min if still unconscious.  Return in about 3 months (around 06/16/2024) for visit and 8 am labs before next visit, lab today.   I have reviewed current medications, nurse's notes, allergies, vital signs, past medical and surgical history, family medical history, and social history for this encounter. Counseled patient on symptoms, examination findings, lab findings, imaging results, treatment decisions and monitoring and prognosis. The patient understood the recommendations and agrees with the treatment plan. All questions regarding treatment plan were fully answered.  Mallory Birmingham, MD  03/16/24    History of Present Illness Mallory Arellano is a 83 y.o. year old female who presents for evaluation of Type II diabetes mellitus.  Mallory Arellano was first diagnosed many years ago.   Diabetes education +  Home diabetes regimen: Glipizide  XL 5 mg every day  COMPLICATIONS -  MI/Stroke -  retinopathy +  neuropathy +  nephropathy  SYMPTOMS REVIEWED + Polyuria - Weight loss + Blurred vision  BLOOD SUGAR DATA Did not bring meter Last checked about 1 mo ago  Physical Exam  BP 130/70   Pulse 84   Ht 5' 6 (1.676 m)   Wt 184 lb (83.5 kg)   SpO2 96%   BMI 29.70 kg/m    Constitutional: well developed, well nourished Head: normocephalic, atraumatic Eyes: sclera anicteric, no redness Neck: supple Lungs: normal respiratory effort Neurology: alert and oriented Skin: dry, no appreciable rashes Musculoskeletal: no appreciable defects Psychiatric: normal mood and affect Diabetic Foot Exam - Simple   Simple Foot Form Diabetic Foot exam was performed with the following findings: Yes 03/16/2024  2:04 PM  Visual Inspection No deformities, no ulcerations, no other skin breakdown bilaterally: Yes Sensation Testing Intact to touch and monofilament testing bilaterally: Yes Pulse Check Posterior Tibialis and Dorsalis  pulse intact bilaterally: Yes Comments R foot big toe callus      Current Medications Patient's Medications  New Prescriptions   SEMAGLUTIDE (RYBELSUS) 3 MG TABS    Take 1 tablet (3 mg total) by mouth daily.  Previous Medications   ACCU-CHEK SOFTCLIX LANCETS LANCETS    Use to check blood glucose twice daily   ACETAMINOPHEN -CODEINE  (TYLENOL  #3) 300-30 MG TABLET    Take 1-2 tablets by mouth every 4 (four) hours as needed for moderate pain.   ALBUTEROL  (VENTOLIN  HFA) 108 (90 BASE) MCG/ACT INHALER    Inhale 2 puffs into the lungs every 6 (six) hours as needed for wheezing or shortness of breath.   BLOOD GLUCOSE MONITORING SUPPL (ACCU-CHEK GUIDE ME) W/DEVICE KIT    Use to check blood glucose twice daily   BUDESONIDE -FORMOTEROL  (SYMBICORT ) 160-4.5 MCG/ACT INHALER    Inhale 2 puffs into the lungs 2 (two) times daily.   CHLORTHALIDONE (HYGROTON) 25 MG TABLET    Take 25 mg by mouth daily.   CHOLECALCIFEROL  (VITAMIN D3) 50 MCG (2000 UT) CAPSULE  TAKE 1 CAPSULE (2,000 UNITS TOTAL) BY MOUTH DAILY WITH BREAKFAST.   DICLOFENAC SODIUM (VOLTAREN) 1 % GEL    Apply 2 g topically 2 (two) times daily.   DIPHENHYDRAMINE  (BENADRYL ) 25 MG CAPSULE    Take 1 capsule (25 mg total) by mouth every 6 (six) hours as needed.   DOCUSATE SODIUM  (COLACE) 100 MG CAPSULE    Take 1 capsule (100 mg total) by mouth 2 (two) times daily. When taking narcotic pain medication   EZETIMIBE  (ZETIA ) 10 MG TABLET    Take 1 tablet (10 mg total) by mouth daily. PLEASE SCHEDULE OFFICE VISIT FOR FURTHER REFILLS. THANK YOU!   FAMOTIDINE  (PEPCID ) 20 MG TABLET    Take 1 tablet (20 mg total) by mouth 2 (two) times daily.   FEXOFENADINE (ALLEGRA) 180 MG TABLET    Take 180 mg by mouth daily.   FLECAINIDE  (TAMBOCOR ) 50 MG TABLET    Take 1 tablet (50 mg total) by mouth every 12 (twelve) hours.   FLUTICASONE  (FLONASE ) 50 MCG/ACT NASAL SPRAY    Place 2 sprays into both nostrils daily.   FUROSEMIDE  (LASIX ) 20 MG TABLET    Take 1 tablet (20 mg total)  by mouth daily as needed for edema.   GLIPIZIDE  (GLUCOTROL  XL) 5 MG 24 HR TABLET    TAKE 1 TABLET BY MOUTH EVERY DAY WITH BREAKFAST   GLUCOSE BLOOD (ACCU-CHEK GUIDE) TEST STRIP    Use to check blood glucose twice daily   HYDROCODONE -ACETAMINOPHEN  (NORCO) 5-325 MG TABLET    Take 1 tablet by mouth every 6 (six) hours as needed for moderate pain.   HYDROXYZINE  (ATARAX /VISTARIL ) 25 MG TABLET    Take 25 mg by mouth 3 (three) times daily as needed for itching.    LEVALBUTEROL  (XOPENEX ) 1.25 MG/3ML NEBULIZER SOLUTION    Take 1.25 mg by nebulization every 6 (six) hours as needed for wheezing.   METOPROLOL  SUCCINATE (TOPROL -XL) 25 MG 24 HR TABLET    Take 0.5 tablets (12.5 mg total) by mouth daily. Take with or immediately following a meal.   MONTELUKAST  (SINGULAIR ) 10 MG TABLET    Take 10 mg by mouth daily.   ONDANSETRON  (ZOFRAN ) 4 MG TABLET    Take 1 tablet (4 mg total) by mouth every 8 (eight) hours as needed for nausea or vomiting.   ONDANSETRON  (ZOFRAN -ODT) 4 MG DISINTEGRATING TABLET    Take 1 tablet (4 mg total) by mouth every 8 (eight) hours as needed for nausea or vomiting. With pain medication   PANTOPRAZOLE  (PROTONIX ) 40 MG TABLET    Take 40 mg by mouth 2 (two) times daily.    POTASSIUM CHLORIDE  SA (KLOR-CON  M) 20 MEQ TABLET    Take 1 tablet (20 mEq total) by mouth 2 (two) times daily for 2 days.   RIVAROXABAN  (XARELTO ) 20 MG TABS TABLET    Take 20 mg by mouth daily with breakfast.   SUMATRIPTAN  (IMITREX ) 25 MG TABLET    Take 1 tablet by mouth every 2 (two) hours as needed for migraine.    TRAMADOL  (ULTRAM ) 50 MG TABLET    Take 1 tablet (50 mg total) by mouth every 8 (eight) hours as needed.  Modified Medications   No medications on file  Discontinued Medications   No medications on file    Allergies Allergies  Allergen Reactions   Atorvastatin      Other reaction(s): Muscle Pain   Losartan  Cough   Sitagliptin Swelling    ABDOMINAL SWELLING   Farxiga  [Dapagliflozin ] Hives  Metformin  And  Related Other (See Comments)    Stomach swelling   Warfarin Sodium Other (See Comments)    Patient didn't know what happened but states she couldn't take it   Penicillins Itching    Has patient had a PCN reaction causing immediate rash, facial/tongue/throat swelling, SOB or lightheadedness with hypotension: No Has patient had a PCN reaction causing severe rash involving mucus membranes or skin necrosis: No Has patient had a PCN reaction that required hospitalization: No Has patient had a PCN reaction occurring within the last 10 years: No If all of the above answers are NO, then may proceed with Cephalosporin use.     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  Social History Social History   Socioeconomic History   Marital status: Widowed    Spouse name: Not on file   Number of children: Not on file   Years of education: Not on file   Highest education level: Not on file  Occupational History   Not on file  Tobacco Use   Smoking status: Never   Smokeless tobacco: Never  Vaping  Use   Vaping status: Never Used  Substance and Sexual Activity   Alcohol use: Not Currently    Alcohol/week: 0.0 standard drinks of alcohol   Drug use: No   Sexual activity: Not Currently    Partners: Male    Birth control/protection: Post-menopausal  Other Topics Concern   Not on file  Social History Narrative   Not on file   Social Drivers of Health   Financial Resource Strain: Low Risk  (10/05/2023)   Received from [facility]   Overall Financial Resource Strain (CARDIA)    Difficulty of Paying Living Expenses: Not very hard  Recent Concern: Financial Resource Strain - Medium Risk (08/04/2023)   Received [facility]   Overall Financial Resource Strain (CARDIA)    Difficulty of Paying Living Expenses: Somewhat hard  Food Insecurity: No Food Insecurity (10/05/2023)   Received from [facility]   Hunger Vital Sign    Within the past 12 months, you worried that your food would run out before you got the money to buy more.: Never true    Within the past 12 months, the food you bought just didn't last and you didn't have money to get more.: Never true  Recent Concern: Food Insecurity - Food Insecurity Present (08/04/2023)   Received from [facility]   Hunger Vital Sign    Ran Out of Food in the Last Year: Sometimes true    Worried About Running Out of Food in the Last Year: Sometimes true  Transportation Needs: No Transportation Needs (10/05/2023)   Received from [facility] - Transportation    In the past 12 months, has lack of transportation kept you from medical appointments or from getting medications?: No    Lack of Transportation (Non-Medical): No  Physical Activity: Unknown (03/09/2017)   Received from [facility]   Exercise Vital Sign    Days of Exercise per Week: Patient declined    Minutes of Exercise per Session: Patient declined  Stress: Unknown  (03/09/2017)   Received from [facility] - Occupational Stress Questionnaire    Feeling of Stress : Patient declined  Social Connections: Unknown (03/09/2017)   Received from [facility]   Social Connection and Isolation Panel    Frequency of Communication with Friends and Family: Patient declined    Frequency of Social Gatherings with Friends and Family: Patient declined    Attends Religious Services: Patient declined    Active Member of Clubs or Organizations: Patient declined    Attends Banker Meetings: Patient declined    Marital Status: Patient declined  Intimate Partner Violence: Not on file    Lab Results  Component Value Date   HGBA1C 7.7 (A) 03/16/2024   HGBA1C 8.8 (A) 12/15/2021   HGBA1C 9.4 (A) 09/11/2021   Lab Results  Component Value Date   CHOL 204 (H) 09/17/2021   Lab Results  Component Value Date   HDL 48 09/17/2021   Lab Results  Component Value Date   LDLCALC 129 (H) 09/17/2021   Lab Results  Component Value Date   TRIG 151 (H) 09/17/2021   Lab Results  Component Value Date   CHOLHDL 4.3 09/17/2021   Lab Results  Component Value Date   CREATININE 1.26 (H) 02/12/2024   No results found for: GFR Lab Results  Component Value Date   MICROALBUR 30 09/29/2021      Component Value Date/Time   NA 139 02/12/2024 2227   NA 143 09/17/2021 0714   K 4.0 02/12/2024 2227   CL 103 02/12/2024 2227   CO2 26 02/12/2024 2227   GLUCOSE 144 (H) 02/12/2024 2227   BUN 24 (H) 02/12/2024 2227   BUN 14 09/17/2021 0714   CREATININE 1.26 (H) 02/12/2024 2227   CALCIUM  9.0 02/12/2024 2227   PROT 7.0 01/05/2022 2217   PROT 6.4 09/17/2021 0714   ALBUMIN 3.9 01/05/2022 2217   ALBUMIN 4.2 09/17/2021 0714   AST 23 01/05/2022 2217   ALT 19 01/05/2022 2217   ALKPHOS 48 01/05/2022 2217   BILITOT 0.4 01/05/2022 2217   BILITOT 0.4 09/17/2021 0714   GFRNONAA 42 (L) 02/12/2024  2227   GFRAA >60 08/13/2019 0518      Latest Ref Rng & Units 02/12/2024   10:27 PM 01/29/2024   10:18 PM 08/14/2023    9:45 PM  BMP  Glucose 70 - 99 mg/dL 855  832  788   BUN 8 - 23 mg/dL 24  17  26    Creatinine 0.44 - 1.00 mg/dL 8.73  9.16  8.95   Sodium 135 - 145 mmol/L 139  134  137   Potassium 3.5 - 5.1 mmol/L 4.0  3.4  3.3   Chloride  98 - 111 mmol/L 103  97  102   CO2 22 - 32 mmol/L 26  28  25    Calcium  8.9 - 10.3 mg/dL 9.0  8.7  8.6        Component Value Date/Time   WBC 9.4 02/12/2024 2227   RBC 4.70 02/12/2024 2227   HGB 13.2 02/12/2024 2227   HCT 41.1 02/12/2024 2227   PLT 290 02/12/2024 2227   MCV 87.4 02/12/2024 2227   MCH 28.1 02/12/2024 2227   MCHC 32.1 02/12/2024 2227   RDW 14.9 02/12/2024 2227   LYMPHSABS 1.2 01/29/2024 2218   MONOABS 1.3 (H) 01/29/2024 2218   EOSABS 0.0 01/29/2024 2218   BASOSABS 0.0 01/29/2024 2218     Parts of this note may have been dictated using voice recognition software. There may be variances in spelling and vocabulary which are unintentional. Not all errors are proofread. Please notify the dino if any discrepancies are noted or if the meaning of any statement is not clear.

## 2024-03-16 NOTE — Patient Instructions (Signed)

## 2024-03-17 LAB — MICROALBUMIN / CREATININE URINE RATIO
Creatinine, Urine: 134 mg/dL (ref 20–275)
Microalb Creat Ratio: 8 mg/g{creat} (ref <30)
Microalb, Ur: 1.1 mg/dL

## 2024-04-26 ENCOUNTER — Encounter: Admitting: Dietician

## 2024-06-29 ENCOUNTER — Other Ambulatory Visit: Payer: Self-pay

## 2024-06-29 DIAGNOSIS — E1165 Type 2 diabetes mellitus with hyperglycemia: Secondary | ICD-10-CM

## 2024-06-29 DIAGNOSIS — E782 Mixed hyperlipidemia: Secondary | ICD-10-CM

## 2024-07-13 ENCOUNTER — Other Ambulatory Visit

## 2024-07-18 ENCOUNTER — Ambulatory Visit: Admitting: "Endocrinology
